# Patient Record
Sex: Female | Born: 1940
Health system: Southern US, Community
[De-identification: ages and names within clinical notes are randomized; demographics above are authoritative.]

## PROBLEM LIST (undated history)

## (undated) DIAGNOSIS — F419 Anxiety disorder, unspecified: Secondary | ICD-10-CM

## (undated) DIAGNOSIS — I10 Essential (primary) hypertension: Secondary | ICD-10-CM

## (undated) DIAGNOSIS — E119 Type 2 diabetes mellitus without complications: Secondary | ICD-10-CM

## (undated) DIAGNOSIS — N951 Menopausal and female climacteric states: Secondary | ICD-10-CM

## (undated) DIAGNOSIS — R079 Chest pain, unspecified: Secondary | ICD-10-CM

## (undated) DIAGNOSIS — E663 Overweight: Secondary | ICD-10-CM

## (undated) HISTORY — DX: Essential (primary) hypertension: I10

## (undated) HISTORY — DX: Anxiety disorder, unspecified: F41.9

## (undated) HISTORY — DX: Menopausal and female climacteric states: N95.1

## (undated) HISTORY — DX: Overweight: E66.3

## (undated) HISTORY — DX: Chest pain, unspecified: R07.9

## (undated) HISTORY — PX: BILATERAL SALPINGOOPHORECTOMY: SHX1223

---
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
## 1999-05-28 ENCOUNTER — Encounter (INDEPENDENT_AMBULATORY_CARE_PROVIDER_SITE_OTHER): Payer: Self-pay | Admitting: Specialist

## 1999-05-28 ENCOUNTER — Other Ambulatory Visit: Admission: RE | Admit: 1999-05-28 | Discharge: 1999-05-28 | Payer: Self-pay | Admitting: Obstetrics and Gynecology

## 1999-06-09 ENCOUNTER — Encounter: Payer: Self-pay | Admitting: Obstetrics and Gynecology

## 1999-06-11 ENCOUNTER — Inpatient Hospital Stay (HOSPITAL_COMMUNITY): Admission: RE | Admit: 1999-06-11 | Discharge: 1999-06-14 | Payer: Self-pay | Admitting: Obstetrics and Gynecology

## 1999-06-11 ENCOUNTER — Encounter (INDEPENDENT_AMBULATORY_CARE_PROVIDER_SITE_OTHER): Payer: Self-pay | Admitting: Specialist

## 2000-04-26 HISTORY — PX: ABDOMINAL HYSTERECTOMY: SHX81

## 2000-06-20 ENCOUNTER — Other Ambulatory Visit: Admission: RE | Admit: 2000-06-20 | Discharge: 2000-06-20 | Payer: Self-pay | Admitting: Obstetrics and Gynecology

## 2001-06-16 ENCOUNTER — Other Ambulatory Visit: Admission: RE | Admit: 2001-06-16 | Discharge: 2001-06-16 | Payer: Self-pay | Admitting: Obstetrics and Gynecology

## 2002-06-27 ENCOUNTER — Other Ambulatory Visit: Admission: RE | Admit: 2002-06-27 | Discharge: 2002-06-27 | Payer: Self-pay | Admitting: Obstetrics and Gynecology

## 2004-07-13 ENCOUNTER — Other Ambulatory Visit: Admission: RE | Admit: 2004-07-13 | Discharge: 2004-07-13 | Payer: Self-pay | Admitting: Obstetrics and Gynecology

## 2004-09-20 ENCOUNTER — Emergency Department: Payer: Self-pay | Admitting: Emergency Medicine

## 2005-08-06 ENCOUNTER — Other Ambulatory Visit: Admission: RE | Admit: 2005-08-06 | Discharge: 2005-08-06 | Payer: Self-pay | Admitting: Obstetrics and Gynecology

## 2007-10-20 ENCOUNTER — Encounter: Payer: Self-pay | Admitting: Cardiology

## 2007-11-13 ENCOUNTER — Ambulatory Visit: Payer: Self-pay | Admitting: Cardiology

## 2008-10-17 DIAGNOSIS — R079 Chest pain, unspecified: Secondary | ICD-10-CM | POA: Insufficient documentation

## 2008-10-17 DIAGNOSIS — E663 Overweight: Secondary | ICD-10-CM | POA: Insufficient documentation

## 2008-10-17 DIAGNOSIS — I10 Essential (primary) hypertension: Secondary | ICD-10-CM | POA: Insufficient documentation

## 2010-05-17 ENCOUNTER — Encounter: Payer: Self-pay | Admitting: Obstetrics and Gynecology

## 2010-09-08 NOTE — Assessment & Plan Note (Signed)
[facility] OFFICE NOTE   NAME:Seiple, VENNIE SALSBURY                 MRN:          [id number]  DATE:11/13/2007                            DOB:          [birth year]    I was asked by Dr. Elease Hashimoto to consult on Emberlie Gotcher, a delightful 70-  year-old divorced [place] female for a family history of coronary artery  disease and a recent episode of chest discomfort.   Ms. Camerer is a 70 year old divorced female who has been under a lot  of stress lately with her family.  She got some bad news about her  brother of hers in [place] recently and had some chest tightness, it  went up into her jaws.  It was relieved with burping and belching.  It  was intermittent but lasted off and on for several days.   She walks 2 miles a day and has no similar symptoms.  She is extremely  health conscious and is very worried about her family history of  coronary artery disease, specifically she had a mother who had a stroke  in her 19s and a brother had a heart attack in his 35s.  I told her that  this was to be expected at that age and I think she concurs.   Her only major risk factors for coronary artery disease at this point is  age 64.  She is a bit overweight, but she is extremely active.  She does  not smoke.  She has a history of hypertension for at least 20 years, but  it has been under great control.  She checks it regularly.  She takes an  aspirin as well.   PAST MEDICAL HISTORY:   ALLERGIES:  She allergic to LATEX.  She is not allergic to dye.   CURRENT MEDICATIONS:  1. Tenoretic 50/25 one daily.  2. Centrum Silver 1 daily.  3. Aspirin 325 mg a day.   She occasionally drinks alcoholic beverages.  She does not smoke.   SURGICAL HISTORY:  1. Hysterectomy in 2002.  2. Appendectomy in 1959.   FAMILY HISTORY:  As above.   SOCIAL HISTORY:  She retired in 2002.  She enjoys babysitting and  housecleaning.  She does  use a little bit of salt at the table.  She is  divorced and has 2 children.   REVIEW OF SYSTEMS:  She has a history of chronic fatigue, bowel  disorder, and urinary tract problems.   Her review of system otherwise are negative.  All systems were  questioned.   PHYSICAL EXAMINATION:  VITAL SIGNS:  Her blood pressure is actually up a  little bit today, but she says she is nervous.  Her blood pressure is  152/87.  Her pulse is 75 and regular.  Weight is 191.  She is 5 foot 1  inch.  GENERAL:  She is extremely pleasant, alert, and oriented x3.  SKIN:  Warm and dry.  She looks much younger than stated age.  HEENT:  PERRLA.  Extraocular movements intact.  Sclerae are clear.  Facial symmetry is normal.  NECK:  Carotids upstrokes are equal bilaterally without bruits.  No JVD.  Thyroid is not enlarged.  LUNGS:  Clear.  HEART:  A poorly appreciated PMI.  She has normal S1 and S2 without  murmur or gallop.  ABDOMEN:  Soft.  Good bowel sounds.  EXTREMITIES:  No cyanosis, clubbing, or edema.  Pulses are intact.  NEURO:  Intact.  SKIN:  Unremarkable.   Electrocardiogram from Dr. Santiago Bur office is essentially normal.   ASSESSMENT AND PLAN:  1. Noncardiac chest pain.  2. Hypertension, under good control at home.  3. Obesity, but regular exercise greater than 3 hours a week.  4. Negative family history of coronary artery disease.   I have had a long discussion with her today.  At this point in time, I  do not think we need to evaluate this further.  If she begins to have  exertional chest tightness or pressure,  I have told her we need to see  her back.  She knows this is angina and how to respond.  She will  continue her aspirin and Tenoretic and follow with Dr. Elease Hashimoto.  Dr.  Elease Hashimoto just ordered lipids, which she will review this week.  I have  recommended a statin, if her LDL is above 100.  I will leave this to Dr.  Santiago Bur discretion.     Thomas C. Daleen Squibb, MD, [facility]   Electronically Signed    TCW/MedQ  DD: 11/13/2007  DT: 11/14/2007  Job #: [id number]   cc:   Lorie Phenix

## 2010-10-15 ENCOUNTER — Encounter: Payer: Self-pay | Admitting: Cardiovascular Disease

## 2011-09-14 DIAGNOSIS — I152 Hypertension secondary to endocrine disorders: Secondary | ICD-10-CM | POA: Insufficient documentation

## 2011-09-14 DIAGNOSIS — I10 Essential (primary) hypertension: Secondary | ICD-10-CM | POA: Insufficient documentation

## 2011-09-14 DIAGNOSIS — N951 Menopausal and female climacteric states: Secondary | ICD-10-CM | POA: Insufficient documentation

## 2011-09-21 ENCOUNTER — Ambulatory Visit (INDEPENDENT_AMBULATORY_CARE_PROVIDER_SITE_OTHER): Payer: Medicare Other | Admitting: Obstetrics and Gynecology

## 2011-09-21 ENCOUNTER — Encounter: Payer: Self-pay | Admitting: Obstetrics and Gynecology

## 2011-09-21 VITALS — BP 124/66 | Ht 61.0 in | Wt 196.0 lb

## 2011-09-21 DIAGNOSIS — R5381 Other malaise: Secondary | ICD-10-CM

## 2011-09-21 DIAGNOSIS — R5383 Other fatigue: Secondary | ICD-10-CM

## 2011-09-21 DIAGNOSIS — Z01419 Encounter for gynecological examination (general) (routine) without abnormal findings: Secondary | ICD-10-CM

## 2011-09-21 LAB — CBC WITH DIFFERENTIAL/PLATELET
Basophils Absolute: 0 10*3/uL (ref 0.0–0.1)
Basophils Relative: 0 % (ref 0–1)
Eosinophils Absolute: 0.1 10*3/uL (ref 0.0–0.7)
Eosinophils Relative: 2 % (ref 0–5)
HCT: 42.3 % (ref 36.0–46.0)
Hemoglobin: 14.6 g/dL (ref 12.0–15.0)
Lymphocytes Relative: 37 % (ref 12–46)
Lymphs Abs: 1.7 10*3/uL (ref 0.7–4.0)
MCH: 32.1 pg (ref 26.0–34.0)
MCHC: 34.5 g/dL (ref 30.0–36.0)
MCV: 93 fL (ref 78.0–100.0)
Monocytes Absolute: 0.4 10*3/uL (ref 0.1–1.0)
Monocytes Relative: 8 % (ref 3–12)
Neutro Abs: 2.4 10*3/uL (ref 1.7–7.7)
Neutrophils Relative %: 53 % (ref 43–77)
Platelets: 257 10*3/uL (ref 150–400)
RBC: 4.55 MIL/uL (ref 3.87–5.11)
RDW: 13.8 % (ref 11.5–15.5)
WBC: 4.6 10*3/uL (ref 4.0–10.5)

## 2011-09-21 LAB — TSH: TSH: 1.856 u[IU]/mL (ref 0.350–4.500)

## 2011-09-21 MED ORDER — VALACYCLOVIR HCL 500 MG PO TABS: 500.0000 mg | ORAL_TABLET | ORAL | Status: DC | PRN

## 2011-09-21 MED ORDER — FLUCONAZOLE 150 MG PO TABS: 150.0000 mg | ORAL_TABLET | Freq: Once | ORAL | Status: AC

## 2011-09-21 NOTE — Patient Instructions (Signed)
Patient Education Materials to be provided at check out (*indicates is located in accordion folder):  *Calcium Recommendations  

## 2011-09-21 NOTE — Progress Notes (Signed)
The patient is not taking hormone replacement therapy The patient  is taking a Calcium supplement. Post-menopausal bleeding:no  Last Pap: was normal April  2010 Last mammogram:  Pt stated declines Mammogram Last DEXA scan : T=1.28 Aug 2010 Last colonoscopy:was normal August 2010  Urinary symptoms: none Normal bowel movements: Yes Reports abuse at home: No   Subjective:    Nicole Lynn is a 71 y.o. female [phone] who presents for annual exam. S/P TAH /BSO The patien C/O fatigue   The following portions of the patient's history were reviewed and updated as appropriate: allergies, current medications, past family history, past medical history, past social history, past surgical history and problem list.  Review of Systems Pertinent items are noted in HPI. Gastrointestinal:No change in bowel habits, no abdominal pain, no rectal bleeding Genitourinary:negative for dysuria, frequency, hematuria, nocturia and urinary incontinence    Objective:     BP 124/66  Ht 5\' 1"  (1.549 m)  Wt 196 lb (88.905 kg)  BMI 37.03 kg/m2  Weight:  Wt Readings from Last 1 Encounters:  09/21/11 196 lb (88.905 kg)     BMI: Body mass index is 37.03 kg/(m^2). General Appearance: Alert, appropriate appearance for age. No acute distress HEENT: Grossly normal Neck / Thyroid: Supple, no masses, nodes or enlargement Lungs: clear to auscultation bilaterally Back: No CVA tenderness Breast Exam: No masses or nodes.No dimpling, nipple retraction or discharge. Cardiovascular: Regular rate and rhythm. S1, S2, no murmur Gastrointestinal: Soft, non-tender, no masses or organomegaly Pelvic Exam: Vulva and vagina normal. Uterus and adnexa surgically absent Rectovaginal: normal rectal, no masses Lymphatic Exam: Non-palpable nodes in neck, clavicular, axillary, or inguinal regions Skin: no rash or abnormalities Neurologic: Normal gait and speech, no tremor  Psychiatric: Alert and oriented, appropriate affect.           Assessment:    Normal gyn exam Fatigue    Plan:    CBC,Vitamin D, TSH   Follow-up:  for annual exam

## 2011-09-21 NOTE — Progress Notes (Signed)
Addended by: Rolla Plate on: 09/21/2011 12:20 PM   Modules accepted: Orders

## 2011-09-22 LAB — VITAMIN D 25 HYDROXY (VIT D DEFICIENCY, FRACTURES): Vit D, 25-Hydroxy: 36 ng/mL (ref 30–89)

## 2013-06-21 ENCOUNTER — Emergency Department: Payer: Self-pay | Admitting: Emergency Medicine

## 2013-06-21 LAB — CBC
HCT: 41.6 % (ref 35.0–47.0)
HGB: 14.1 g/dL (ref 12.0–16.0)
MCH: 32.9 pg (ref 26.0–34.0)
MCHC: 33.9 g/dL (ref 32.0–36.0)
MCV: 97 fL (ref 80–100)
Platelet: 201 10*3/uL (ref 150–440)
RBC: 4.28 10*6/uL (ref 3.80–5.20)
RDW: 13.2 % (ref 11.5–14.5)
WBC: 4.9 10*3/uL (ref 3.6–11.0)

## 2013-06-21 LAB — BASIC METABOLIC PANEL
Anion Gap: 7 (ref 7–16)
BUN: 17 mg/dL (ref 7–18)
Calcium, Total: 8.5 mg/dL (ref 8.5–10.1)
Chloride: 104 mmol/L (ref 98–107)
Co2: 27 mmol/L (ref 21–32)
Creatinine: 0.54 mg/dL — ABNORMAL LOW (ref 0.60–1.30)
EGFR (African American): >60
EGFR (Non-African Amer.): >60
Glucose: 180 mg/dL — ABNORMAL HIGH (ref 65–99)
Osmolality: 282 (ref 275–301)
Potassium: 4.2 mmol/L (ref 3.5–5.1)
Sodium: 138 mmol/L (ref 136–145)

## 2013-06-21 LAB — TROPONIN I: Troponin-I: <0.02 ng/mL

## 2013-11-21 ENCOUNTER — Other Ambulatory Visit: Payer: Self-pay | Admitting: *Deleted

## 2013-11-21 DIAGNOSIS — I83893 Varicose veins of bilateral lower extremities with other complications: Secondary | ICD-10-CM

## 2014-01-25 ENCOUNTER — Encounter: Payer: Self-pay | Admitting: Surgery

## 2014-01-28 ENCOUNTER — Encounter (INDEPENDENT_AMBULATORY_CARE_PROVIDER_SITE_OTHER): Payer: Medicare Other | Admitting: Surgery

## 2014-01-28 ENCOUNTER — Ambulatory Visit (HOSPITAL_COMMUNITY)
Admission: RE | Admit: 2014-01-28 | Discharge: 2014-01-28 | Disposition: A | Discharge status: Home / Self Care | Payer: Medicare Other | Source: Ambulatory Visit | Attending: Surgery | Admitting: Surgery

## 2014-01-28 DIAGNOSIS — I8393 Asymptomatic varicose veins of bilateral lower extremities: Secondary | ICD-10-CM | POA: Insufficient documentation

## 2014-02-01 ENCOUNTER — Encounter: Payer: Self-pay | Admitting: Surgery

## 2014-02-04 ENCOUNTER — Encounter: Payer: Self-pay | Admitting: Surgery

## 2014-02-04 ENCOUNTER — Ambulatory Visit (INDEPENDENT_AMBULATORY_CARE_PROVIDER_SITE_OTHER): Payer: Medicare Other | Admitting: Surgery

## 2014-02-04 VITALS — BP 140/78 | HR 80 | Ht 61.0 in | Wt 182.0 lb

## 2014-02-04 DIAGNOSIS — I8390 Asymptomatic varicose veins of unspecified lower extremity: Secondary | ICD-10-CM | POA: Insufficient documentation

## 2014-02-04 DIAGNOSIS — I83893 Varicose veins of bilateral lower extremities with other complications: Secondary | ICD-10-CM

## 2014-02-04 DIAGNOSIS — I83899 Varicose veins of unspecified lower extremities with other complications: Secondary | ICD-10-CM | POA: Insufficient documentation

## 2014-02-04 NOTE — Progress Notes (Signed)
Patient name: Nicole Lynn MRN: [id number] DOB: 1940-06-19 Sex: female   Referred by: Dr. Estanislado Pandyivard  Reason for referral:  Chief Complaint  Patient presents with  . New Evaluation    vv's and spiders     HISTORY OF PRESENT ILLNESS: The patient comes in today for evaluation of bilateral varicose veins.  These have gotten to the point where they now visually bother her.  She denies any episodes of bleeding.  She denies any significant lower extremity swelling with the exception of when she goes on long trips.  She has not had any bleeding episodes.  The patient sought medical issues is hypertension which she takes medication for.  She is a nonsmoker.  Past Medical History  Diagnosis Date  . Overweight(278.02)   . Chest pain, unspecified   . Menopausal state   . HTN (hypertension)     Past Surgical History  Procedure Laterality Date  . Appendectomy  1959  . Abdominal hysterectomy  2002    TAH/BSO  . Bilateral salpingoophorectomy      History   Social History  . Marital Status: Married    Spouse Name: N/A    Number of Children: N/A  . Years of Education: N/A   Occupational History  . Not on file.   Social History Main Topics  . Smoking status: Never Smoker   . Smokeless tobacco: Never Used  . Alcohol Use: No  . Drug Use: No  . Sexual Activity: No     Comment: BTL/HYST   Other Topics Concern  . Not on file   Social History Narrative   Full time. Regularly exercises.     Family History  Problem Relation Age of Onset  . Cancer Other   . Coronary artery disease Other   . Stroke Other   . Stroke Mother   . Prostate cancer Father     Allergies as of 02/04/2014 - Review Complete 02/04/2014  Allergen Reaction Noted  . Latex  09/14/2011    Current Outpatient Prescriptions on File Prior to Visit  Medication Sig Dispense Refill  . atenolol-chlorthalidone (TENORETIC) 50-25 MG per tablet Take 1 tablet by mouth daily.      . fish oil-omega-3 fatty  acids 1000 MG capsule Take 2 g by mouth daily.      . Multiple Vitamins-Minerals (CENTRUM SILVER PO) Take by mouth once.      . valACYclovir (VALTREX) 500 MG tablet Take 1 tablet (500 mg total) by mouth as needed.  30 tablet  11   No current facility-administered medications on file prior to visit.     REVIEW OF SYSTEMS: Cardiovascular: No chest pain, chest pressure, palpitations, orthopnea, or dyspnea on exertion. No claudication or rest pain,  positive for Pulmonary: No productive cough, asthma or wheezing. Neurologic: No weakness, paresthesias, aphasia, or amaurosis. No dizziness. Hematologic: No bleeding problems or clotting disorders. Musculoskeletal: No joint pain or joint swelling. Gastrointestinal: No blood in stool or hematemesis Genitourinary: No dysuria or hematuria. Psychiatric:: No history of major depression. Integumentary: No rashes or ulcers. Constitutional: No fever or chills.  PHYSICAL EXAMINATION: General: The patient appears their stated age.  Vital signs are BP 140/78  Pulse 80  Ht 5\' 1"  (1.549 m)  Wt 182 lb (82.555 kg)  BMI 34.41 kg/m2  SpO2 97% HEENT:  No gross abnormalities Pulmonary: Respirations are non-labored Musculoskeletal: There are no major deformities.   Neurologic: No focal weakness or paresthesias are detected, Skin: There are no ulcer or  rashes noted. Psychiatric: The patient has normal affect. Cardiovascular: Bilateral spider veins in the anterior thigh and posterior knee  Diagnostic Studies: I have ordered and reviewed her venous insufficiency study.  She has no evidence of deep or superficial venous reflux in the left leg.  There is no deep vein reflux on the right.  There is reflux within the right short saphenous vein.  The great saphenous vein has no reflux   Assessment:  Venous insufficiency right leg Bilateral spider veins Plan: I do not think that the patient's reflux on the right leg is contributing to any symptoms.  Her biggest  complaint is that of spider veins.  I think she would be a good candidate for sclerotherapy on both legs.  I think this could probably be done with one vial.  She will be evaluated by Clementeen HoofLiz Wert later today.  I discussed getting thigh-high compression stockings for the procedure.     Jorge NyV. Wells Chayil Gantt IV, M.D. [facility] Office: [phone] Pager:  [phone]

## 2014-02-25 ENCOUNTER — Encounter: Payer: Self-pay | Admitting: Surgery

## 2014-03-27 ENCOUNTER — Ambulatory Visit: Payer: Medicare Other | Admitting: *Deleted

## 2014-05-14 ENCOUNTER — Encounter: Payer: Self-pay | Admitting: *Deleted

## 2014-05-15 ENCOUNTER — Ambulatory Visit (INDEPENDENT_AMBULATORY_CARE_PROVIDER_SITE_OTHER): Payer: Medicare Other | Admitting: *Deleted

## 2014-05-15 DIAGNOSIS — I8393 Asymptomatic varicose veins of bilateral lower extremities: Secondary | ICD-10-CM

## 2014-05-15 DIAGNOSIS — I781 Nevus, non-neoplastic: Secondary | ICD-10-CM

## 2014-05-15 NOTE — Progress Notes (Signed)
Pt [place] in [place] so we rescheduled for 06/26/14.

## 2014-05-29 ENCOUNTER — Ambulatory Visit: Payer: Self-pay

## 2014-06-25 ENCOUNTER — Encounter: Payer: Self-pay | Admitting: *Deleted

## 2014-06-26 ENCOUNTER — Ambulatory Visit (INDEPENDENT_AMBULATORY_CARE_PROVIDER_SITE_OTHER): Payer: Medicare Other | Admitting: *Deleted

## 2014-06-26 DIAGNOSIS — I8393 Asymptomatic varicose veins of bilateral lower extremities: Secondary | ICD-10-CM

## 2014-06-26 DIAGNOSIS — I781 Nevus, non-neoplastic: Secondary | ICD-10-CM

## 2014-06-26 NOTE — Progress Notes (Signed)
X=.3% Sotradecol administered with a 27g butterfly.  Patient received a total of 12cc.  Treated all areas of concern with 2 syringes. Majority were on the back of her knees. Tol well. Easy access. Will follow prn.  Photos: Yes.    Compression stockings applied: Yes.

## 2014-06-27 ENCOUNTER — Encounter: Payer: Self-pay | Admitting: Vascular Surgery

## 2015-02-11 IMAGING — CR DG CHEST 1V PORT
1 series · 1 of 1 positions shown · non-contrast
Comparison: None.

CLINICAL DATA: Chest pain

EXAM:
PORTABLE CHEST - 1 VIEW

[ap]
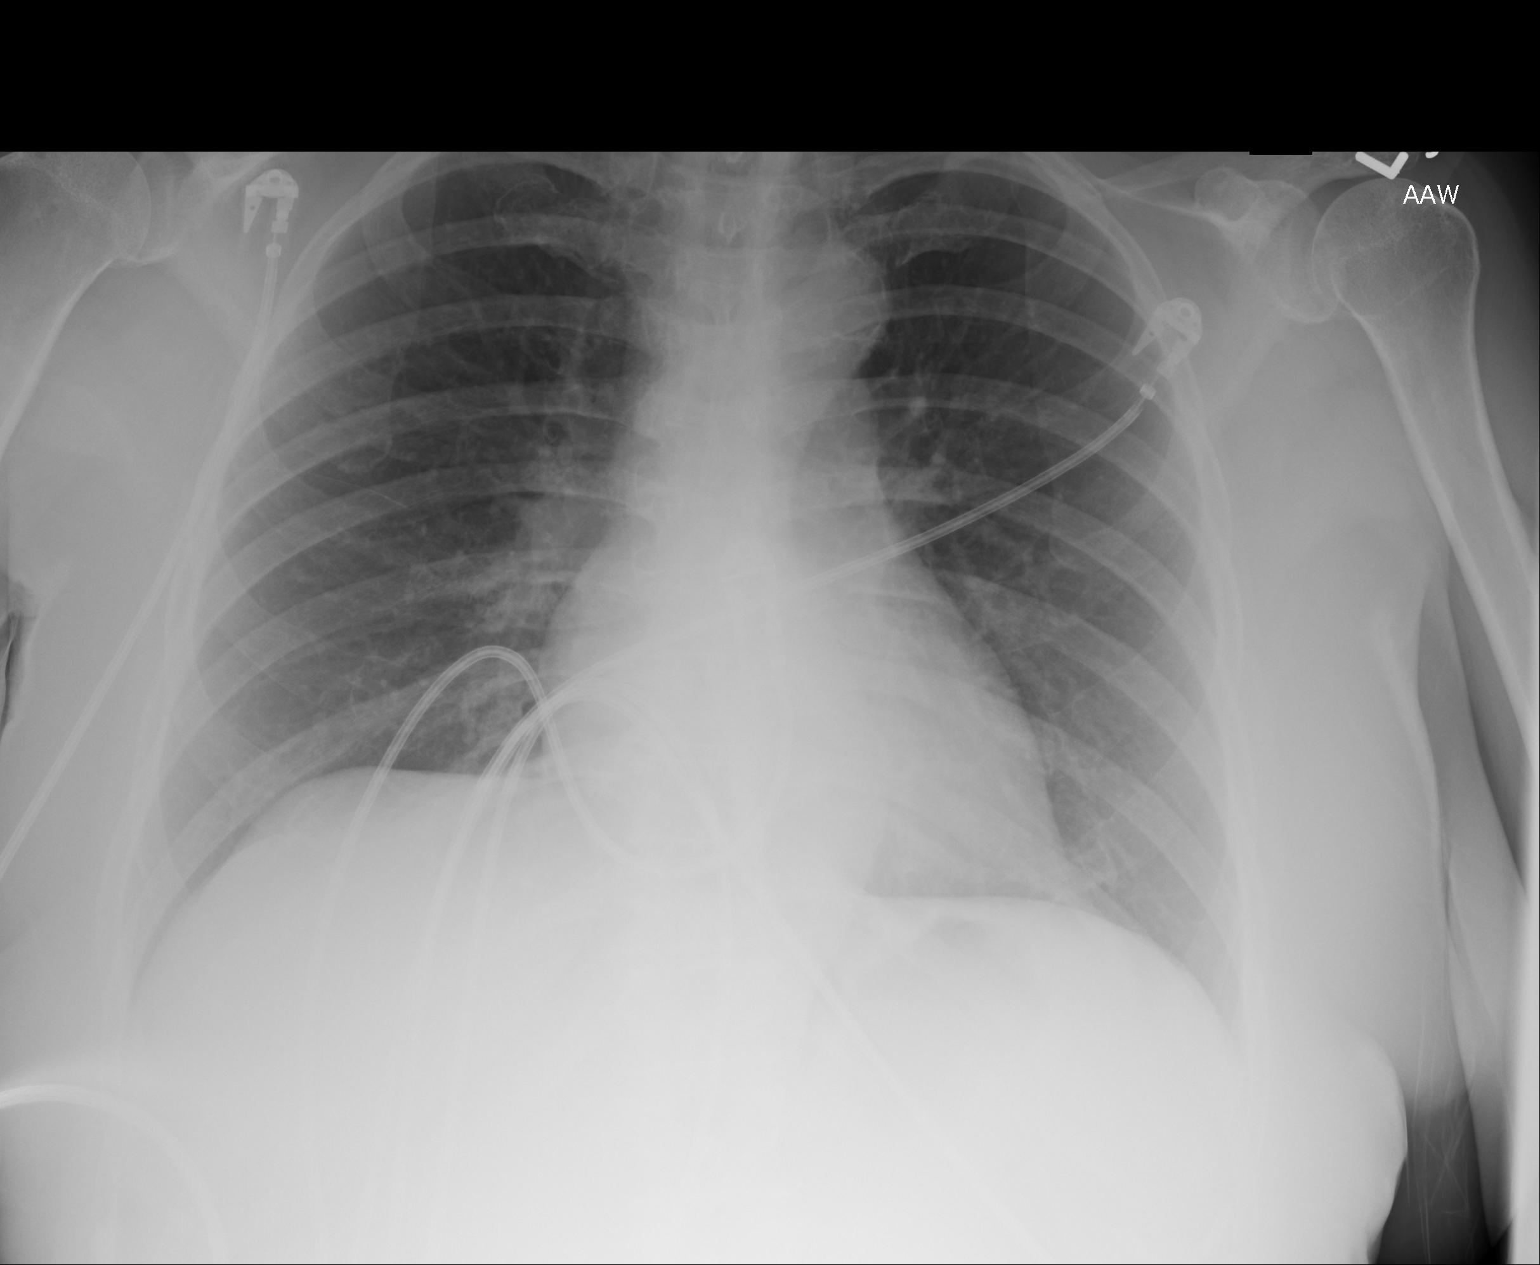

[1 of 1 positions shown; findings below may reference images not displayed]

FINDINGS: Cardiomediastinal silhouette is unremarkable. No acute infiltrate or
pleural effusion. No pulmonary edema. Bony thorax is unremarkable.
IMPRESSION: No active disease.

## 2015-02-28 ENCOUNTER — Other Ambulatory Visit: Payer: Self-pay | Admitting: Family Medicine

## 2015-02-28 DIAGNOSIS — G4700 Insomnia, unspecified: Secondary | ICD-10-CM

## 2015-02-28 MED ORDER — ZOLPIDEM TARTRATE 10 MG PO TABS: 10.0000 mg | ORAL_TABLET | Freq: Every day | ORAL | Status: DC

## 2015-02-28 NOTE — Telephone Encounter (Signed)
Printed, please fax or call in to pharmacy. Thank you.   

## 2015-02-28 NOTE — Telephone Encounter (Signed)
Pt contacted office for refill request on the following medications:  Zolpidem 10mg .  [facility] [address]  [phone]#[phone]/MW

## 2015-05-16 ENCOUNTER — Encounter: Payer: Self-pay | Admitting: Family Medicine

## 2015-05-16 ENCOUNTER — Ambulatory Visit (INDEPENDENT_AMBULATORY_CARE_PROVIDER_SITE_OTHER): Payer: Medicare Other | Admitting: Family Medicine

## 2015-05-16 VITALS — BP 124/64 | HR 72 | Temp 97.8°F | Resp 16 | Ht 60.5 in | Wt 192.0 lb

## 2015-05-16 DIAGNOSIS — E7800 Pure hypercholesterolemia, unspecified: Secondary | ICD-10-CM | POA: Insufficient documentation

## 2015-05-16 DIAGNOSIS — F40240 Claustrophobia: Secondary | ICD-10-CM | POA: Insufficient documentation

## 2015-05-16 DIAGNOSIS — E669 Obesity, unspecified: Secondary | ICD-10-CM | POA: Insufficient documentation

## 2015-05-16 DIAGNOSIS — Z0000 Encounter for general adult medical examination without abnormal findings: Secondary | ICD-10-CM | POA: Diagnosis not present

## 2015-05-16 DIAGNOSIS — R739 Hyperglycemia, unspecified: Secondary | ICD-10-CM | POA: Insufficient documentation

## 2015-05-16 DIAGNOSIS — I10 Essential (primary) hypertension: Secondary | ICD-10-CM | POA: Insufficient documentation

## 2015-05-16 DIAGNOSIS — E038 Other specified hypothyroidism: Secondary | ICD-10-CM | POA: Insufficient documentation

## 2015-05-16 DIAGNOSIS — Z23 Encounter for immunization: Secondary | ICD-10-CM

## 2015-05-16 DIAGNOSIS — M25569 Pain in unspecified knee: Secondary | ICD-10-CM | POA: Insufficient documentation

## 2015-05-16 DIAGNOSIS — R5383 Other fatigue: Secondary | ICD-10-CM | POA: Insufficient documentation

## 2015-05-16 DIAGNOSIS — Z87440 Personal history of urinary (tract) infections: Secondary | ICD-10-CM

## 2015-05-16 DIAGNOSIS — E039 Hypothyroidism, unspecified: Secondary | ICD-10-CM

## 2015-05-16 DIAGNOSIS — E1169 Type 2 diabetes mellitus with other specified complication: Secondary | ICD-10-CM | POA: Insufficient documentation

## 2015-05-16 DIAGNOSIS — F419 Anxiety disorder, unspecified: Secondary | ICD-10-CM | POA: Insufficient documentation

## 2015-05-16 MED ORDER — PHENAZOPYRIDINE HCL 200 MG PO TABS: 200.0000 mg | ORAL_TABLET | Freq: Three times a day (TID) | ORAL | Status: DC | PRN

## 2015-05-16 MED ORDER — NITROFURANTOIN MACROCRYSTAL 100 MG PO CAPS: 100.0000 mg | ORAL_CAPSULE | Freq: Four times a day (QID) | ORAL | Status: DC

## 2015-05-16 MED ORDER — CIPROFLOXACIN HCL 500 MG PO TABS: 500.0000 mg | ORAL_TABLET | Freq: Two times a day (BID) | ORAL | Status: DC

## 2015-05-16 MED ORDER — ATENOLOL-CHLORTHALIDONE 50-25 MG PO TABS: 1.0000 | ORAL_TABLET | Freq: Every day | ORAL | Status: DC

## 2015-05-16 NOTE — Progress Notes (Signed)
Patient ID: Nicole Lynn, female   DOB: 1941-01-22, 75 y.o.   MRN: [id number]       Patient: Nicole Lynn, Female    DOB: 1941/02/06, 75 y.o.   MRN: [id number] Visit Date: 05/16/2015  Today's Provider: Lorie Phenix, MD   Chief Complaint  Patient presents with  . Medicare Wellness   Subjective:    Annual wellness visit Nicole Lynn is a 75 y.o. female. She feels well. She reports exercising daily (yardwork). She reports she is sleeping well. 02/18/14 CPE  -----------------------------------------------------------   Review of Systems  Constitutional: Negative.   HENT: Negative.   Eyes: Negative.   Respiratory: Negative.   Cardiovascular: Negative.   Gastrointestinal: Negative.   Endocrine: Negative.   Genitourinary: Negative.   Musculoskeletal: Negative.   Skin: Negative.   Allergic/Immunologic: Negative.   Neurological: Negative.   Hematological: Negative.   Psychiatric/Behavioral: Negative.     Social History   Social History  . Marital Status: Married    Spouse Name: N/A  . Number of Children: N/A  . Years of Education: N/A   Occupational History  . Not on file.   Social History Main Topics  . Smoking status: Never Smoker   . Smokeless tobacco: Never Used  . Alcohol Use: Not on file  . Drug Use: No  . Sexual Activity: No     Comment: BTL/HYST   Other Topics Concern  . Not on file   Social History Narrative   Full time. Regularly exercises.     Past Medical History  Diagnosis Date  . Overweight(278.02)   . Chest pain, unspecified   . Menopausal state   . HTN (hypertension)   . Anxiety      Patient Active Problem List   Diagnosis Date Noted  . Anxiety 05/16/2015  . Claustrophobia 05/16/2015  . Fatigue 05/16/2015  . Hypercholesteremia 05/16/2015  . Blood glucose elevated 05/16/2015  . BP (high blood pressure) 05/16/2015  . Adiposity 05/16/2015  . Gonalgia 05/16/2015  . Subclinical hypothyroidism 05/16/2015  .  Insomnia 02/28/2015  . Varicose veins of leg with complications 02/04/2014  . Leg varices 02/04/2014  . Menopausal state   . HTN (hypertension)   . OVERWEIGHT/OBESITY 10/17/2008  . CHEST PAIN-UNSPECIFIED 10/17/2008  . Chest pain 10/17/2008    Past Surgical History  Procedure Laterality Date  . Appendectomy  1959  . Abdominal hysterectomy  2002    TAH/BSO  . Bilateral salpingoophorectomy      Her family history includes Cancer in her other; Coronary artery disease in her other; Prostate cancer in her father; Stroke in her mother and other.    Previous Medications   ATENOLOL-CHLORTHALIDONE (TENORETIC) 50-25 MG PER TABLET    Take 1 tablet by mouth daily.   FISH OIL-OMEGA-3 FATTY ACIDS 1000 MG CAPSULE    Take 2 g by mouth daily.   MULTIPLE VITAMINS-MINERALS (CENTRUM SILVER PO)    Take by mouth once.   ZOLPIDEM (AMBIEN) 10 MG TABLET    Take 1 tablet (10 mg total) by mouth at bedtime.    Patient Care Team: Lorie Phenix, MD as PCP - General (Family Medicine)     Objective:   Vitals: BP 124/64 mmHg  Pulse 72  Temp(Src) 97.8 F (36.6 C) (Oral)  Resp 16  Ht 5' 0.5" (1.537 m)  Wt 192 lb (87.091 kg)  BMI 36.87 kg/m2  SpO2 97%  Physical Exam  Constitutional: She is oriented to person, place, and time. She appears well-developed and well-nourished.  HENT:  Head: Normocephalic and atraumatic.  Right Ear: Tympanic membrane, external ear and ear canal normal.  Left Ear: Tympanic membrane, external ear and ear canal normal.  Nose: Nose normal.  Mouth/Throat: Uvula is midline, oropharynx is clear and moist and mucous membranes are normal.  Eyes: Conjunctivae, EOM and lids are normal. Pupils are equal, round, and reactive to light.  Neck: Trachea normal and normal range of motion. Neck supple. Carotid bruit is not present. No thyroid mass and no thyromegaly present.  Cardiovascular: Normal rate, regular rhythm and normal heart sounds.   Pulmonary/Chest: Effort normal and breath  sounds normal.  Abdominal: Soft. Normal appearance and bowel sounds are normal. There is no hepatosplenomegaly. There is no tenderness.  Genitourinary: No breast swelling, tenderness or discharge.  Musculoskeletal: Normal range of motion.  Lymphadenopathy:    She has no cervical adenopathy.    She has no axillary adenopathy.  Neurological: She is alert and oriented to person, place, and time. She has normal strength. No cranial nerve deficit.  Skin: Skin is warm, dry and intact.  Psychiatric: She has a normal mood and affect. Her speech is normal and behavior is normal. Judgment and thought content normal. Cognition and memory are normal.    Activities of Daily Living In your present state of health, do you have any difficulty performing the following activities: 05/16/2015  Hearing? N  Vision? N  Difficulty concentrating or making decisions? N  Walking or climbing stairs? N  Dressing or bathing? N  Doing errands, shopping? N    Fall Risk Assessment Fall Risk  05/16/2015  Falls in the past year? No     Depression Screen PHQ 2/9 Scores 05/16/2015  PHQ - 2 Score 0    Cognitive Testing - 6-CIT  Correct? Score   What year is it? yes 0 0 or 4  What month is it? yes 0 0 or 3  Memorize:    Floyde Parkins,  44,  [address]  [place],      What time is it? (within 1 hour) yes 0 0 or 3  Count backwards from 20 yes 0 0, 2, or 4  Name the months of the year yes 0 0, 2, or 4  Repeat name & address above no 1 0, 2, 4, 6, 8, or 10       TOTAL SCORE  1/28   Interpretation:  Normal  Normal (0-7) Abnormal (8-28)       Assessment & Plan:     Annual Wellness Visit  Reviewed patient's Family Medical History Reviewed and updated list of patient's medical providers Assessment of cognitive impairment was done Assessed patient's functional ability Established a written schedule for health screening services Health Risk Assessent Completed and Reviewed  Exercise Activities and Dietary  recommendations Goals    None      Immunization History  Administered Date(s) Administered  . Influenza,inj,Quad PF,36+ Mos 05/16/2015  . Pneumococcal Conjugate-13 05/16/2015  . Pneumococcal Polysaccharide-23 01/08/2013  . Zoster 10/25/2012      1. Medicare annual wellness visit, subsequent Stable. Patient advised to continue eating healthy and exercise daily.  2. Need for influenza vaccination - Flu Vaccine QUAD 36+ mos IM  3. Need for pneumococcal vaccination - Pneumococcal conjugate vaccine 13-valent IM  4. Subclinical hypothyroidism - TSH  5. Essential hypertension - CBC with Differential/Platelet - Comprehensive metabolic panel  6. Hypercholesteremia - Lipid Panel With LDL/HDL Ratio  7. Blood glucose elevated - Hemoglobin A1c   8. History  of recurrent UTIs Refill medication to sue as needed.  - ciprofloxacin (CIPRO) 500 MG tablet; Take 1 tablet (500 mg total) by mouth 2 (two) times daily.  Dispense: 14 tablet; Refill: 1 - phenazopyridine (PYRIDIUM) 200 MG tablet; Take 1 tablet (200 mg total) by mouth 3 (three) times daily as needed for pain.  Dispense: 10 tablet; Refill: 1 - nitrofurantoin (MACRODANTIN) 100 MG capsule; Take 1 capsule (100 mg total) by mouth 4 (four) times daily.  Dispense: 30 capsule; Refill: 5    Patient seen and examinedLeo GrosserJ.. Oluwateniola Leitch, and note scribed by Liz Beach. Dimas, CMA.  I have reviewed the document for accuracy and completeness and I agree with above. Leo Grosser, MD   Lorie Phenix, MD      ------------------------------------------------------------------------------------------------------------

## 2015-05-19 ENCOUNTER — Other Ambulatory Visit: Payer: Self-pay | Admitting: Family Medicine

## 2015-05-19 DIAGNOSIS — I10 Essential (primary) hypertension: Secondary | ICD-10-CM

## 2015-05-28 LAB — CBC WITH DIFFERENTIAL/PLATELET
Basophils Absolute: 0.1 10*3/uL (ref 0.0–0.2)
Basos: 1 %
EOS (ABSOLUTE): 0.2 10*3/uL (ref 0.0–0.4)
Eos: 3 %
Hematocrit: 45.1 % (ref 34.0–46.6)
Hemoglobin: 15 g/dL (ref 11.1–15.9)
Immature Grans (Abs): 0 10*3/uL (ref 0.0–0.1)
Immature Granulocytes: 0 %
Lymphocytes Absolute: 1.9 10*3/uL (ref 0.7–3.1)
Lymphs: 37 %
MCH: 32.4 pg (ref 26.6–33.0)
MCHC: 33.3 g/dL (ref 31.5–35.7)
MCV: 97 fL (ref 79–97)
Monocytes Absolute: 0.4 10*3/uL (ref 0.1–0.9)
Monocytes: 8 %
Neutrophils Absolute: 2.6 10*3/uL (ref 1.4–7.0)
Neutrophils: 51 %
Platelets: 230 10*3/uL (ref 150–379)
RBC: 4.63 x10E6/uL (ref 3.77–5.28)
RDW: 13.7 % (ref 12.3–15.4)
WBC: 5.1 10*3/uL (ref 3.4–10.8)

## 2015-05-28 LAB — COMPREHENSIVE METABOLIC PANEL
ALT: 21 IU/L (ref 0–32)
AST: 19 IU/L (ref 0–40)
Albumin/Globulin Ratio: 1.8 (ref 1.1–2.5)
Albumin: 4.1 g/dL (ref 3.5–4.8)
Alkaline Phosphatase: 55 IU/L (ref 39–117)
BUN/Creatinine Ratio: 29 — ABNORMAL HIGH (ref 11–26)
BUN: 19 mg/dL (ref 8–27)
Bilirubin Total: 0.4 mg/dL (ref 0.0–1.2)
CO2: 24 mmol/L (ref 18–29)
Calcium: 9.4 mg/dL (ref 8.7–10.3)
Chloride: 100 mmol/L (ref 96–106)
Creatinine, Ser: 0.65 mg/dL (ref 0.57–1.00)
GFR calc Af Amer: 103 mL/min/{1.73_m2} (ref >59)
GFR calc non Af Amer: 89 mL/min/{1.73_m2} (ref >59)
Globulin, Total: 2.3 g/dL (ref 1.5–4.5)
Glucose: 136 mg/dL — ABNORMAL HIGH (ref 65–99)
Potassium: 4.3 mmol/L (ref 3.5–5.2)
Sodium: 142 mmol/L (ref 134–144)
Total Protein: 6.4 g/dL (ref 6.0–8.5)

## 2015-05-28 LAB — HEMOGLOBIN A1C
Est. average glucose Bld gHb Est-mCnc: 151 mg/dL
Hgb A1c MFr Bld: 6.9 % — ABNORMAL HIGH (ref 4.8–5.6)

## 2015-05-28 LAB — LIPID PANEL WITH LDL/HDL RATIO
Cholesterol, Total: 248 mg/dL — ABNORMAL HIGH (ref 100–199)
HDL: 54 mg/dL (ref >39)
LDL Calculated: 149 mg/dL — ABNORMAL HIGH (ref 0–99)
LDl/HDL Ratio: 2.8 ratio units (ref 0.0–3.2)
Triglycerides: 225 mg/dL — ABNORMAL HIGH (ref 0–149)
VLDL Cholesterol Cal: 45 mg/dL — ABNORMAL HIGH (ref 5–40)

## 2015-05-28 LAB — TSH: TSH: 5.35 u[IU]/mL — ABNORMAL HIGH (ref 0.450–4.500)

## 2015-05-29 ENCOUNTER — Telehealth: Payer: Self-pay

## 2015-05-29 NOTE — Telephone Encounter (Signed)
Pt advised; made apt for 06/06/2015 at 2:30  Thanks,   -Vernona Rieger

## 2015-05-29 NOTE — Telephone Encounter (Signed)
-----   Message from Lorie Phenix, MD sent at 05/29/2015  1:22 PM EST ----- Blood sugar now  In diabetic range and  Cholesterol elevated.   Recommend ov to discuss treatment options.  Thanks.

## 2015-05-29 NOTE — Telephone Encounter (Signed)
LMTCB 05/29/2015  Thanks,   -Vernona Rieger

## 2015-06-06 ENCOUNTER — Ambulatory Visit (INDEPENDENT_AMBULATORY_CARE_PROVIDER_SITE_OTHER): Payer: Medicare Other | Admitting: Family Medicine

## 2015-06-06 ENCOUNTER — Encounter: Payer: Self-pay | Admitting: Family Medicine

## 2015-06-06 VITALS — BP 122/70 | HR 68 | Temp 98.2°F | Resp 16 | Wt 192.0 lb

## 2015-06-06 DIAGNOSIS — I10 Essential (primary) hypertension: Secondary | ICD-10-CM

## 2015-06-06 DIAGNOSIS — E119 Type 2 diabetes mellitus without complications: Secondary | ICD-10-CM | POA: Insufficient documentation

## 2015-06-06 NOTE — Progress Notes (Signed)
Patient ID: Nicole Lynn, female   DOB: 1940/07/14, 75 y.o.   MRN: [id number]         Patient: Nicole Lynn Female    DOB: 05/31/1940   75 y.o.   MRN: [id number] Visit Date: 06/06/2015  Today's Provider: Lorie Phenix, MD   Chief Complaint  Patient presents with  . Diabetes  . Hyperlipidemia   Subjective:    Diabetes She presents for her initial diabetic visit. She has type 2 diabetes mellitus. No MedicAlert identification noted. There are no hypoglycemic associated symptoms. Pertinent negatives for hypoglycemia include no dizziness or headaches. Pertinent negatives for diabetes include no blurred vision, no chest pain, no fatigue, no foot paresthesias, no foot ulcerations, no polydipsia, no polyphagia, no polyuria, no visual change, no weakness and no weight loss. Risk factors for coronary artery disease include diabetes mellitus, dyslipidemia and obesity. When asked about current treatments, none were reported. She does not see a podiatrist.Eye exam is current.  Hyperlipidemia The problem is uncontrolled. Recent lipid tests were reviewed and are high. Pertinent negatives include no chest pain. Risk factors for coronary artery disease include diabetes mellitus, dyslipidemia, hypertension and obesity.   Lab Results  Component Value Date   CHOL 248* 05/27/2015   HDL 54 05/27/2015   LDLCALC 149* 05/27/2015   TRIG 225* 05/27/2015   Lab Results  Component Value Date   HGBA1C 6.9* 05/27/2015        Allergies  Allergen Reactions  . Latex    Previous Medications   ATENOLOL-CHLORTHALIDONE (TENORETIC) 50-25 MG TABLET    take 1 tablet by mouth once daily   FISH OIL-OMEGA-3 FATTY ACIDS 1000 MG CAPSULE    Take 2 g by mouth daily.   MULTIPLE VITAMINS-MINERALS (CENTRUM SILVER PO)    Take by mouth once.   NITROFURANTOIN, MACROCRYSTAL-MONOHYDRATE, (MACROBID) 100 MG CAPSULE    take 1 capsule by mouth once daily if needed   PHENAZOPYRIDINE (PYRIDIUM) 200 MG TABLET    Take 1  tablet (200 mg total) by mouth 3 (three) times daily as needed for pain.   ZOLPIDEM (AMBIEN) 10 MG TABLET    Take 1 tablet (10 mg total) by mouth at bedtime.    Review of Systems  Constitutional: Negative.  Negative for weight loss and fatigue.  Eyes: Negative for blurred vision.  Respiratory: Negative.   Cardiovascular: Negative.  Negative for chest pain.  Gastrointestinal: Negative.   Endocrine: Negative.  Negative for polydipsia, polyphagia and polyuria.  Musculoskeletal: Negative.   Neurological: Negative for dizziness, weakness, light-headedness and headaches.    Social History  Substance Use Topics  . Smoking status: Never Smoker   . Smokeless tobacco: Never Used  . Alcohol Use: Not on file   Objective:   BP 122/70 mmHg  Pulse 68  Temp(Src) 98.2 F (36.8 C) (Oral)  Resp 16  Wt 192 lb (87.091 kg)  Physical Exam  Constitutional: She is oriented to person, place, and time. She appears well-developed and well-nourished.  Neurological: She is alert and oriented to person, place, and time.  Psychiatric: She has a normal mood and affect. Her behavior is normal. Judgment and thought content normal.      Assessment & Plan:     1. Essential hypertension Condition is stable. Please continue current medication and  plan of care as noted.    2. Type 2 diabetes mellitus without complication, without long-term current use of insulin (HCC) New problem.  Discussed healthy lifestyle changes.  Will work on eating healthy  and exercise.  Taught how to check sugars today and monitor.   Recheck in 3 months.     Patient was seen and examined by Leo Grosser, MD, and note scribed by Kavin Leech, CMA.  I have reviewed the document for accuracy and completeness and I agree with above. - Leo Grosser, MD     Lorie Phenix, MD  [facility]

## 2015-06-16 ENCOUNTER — Other Ambulatory Visit: Payer: Self-pay | Admitting: Family Medicine

## 2015-06-16 DIAGNOSIS — E119 Type 2 diabetes mellitus without complications: Secondary | ICD-10-CM

## 2015-06-16 MED ORDER — GLUCOSE BLOOD VI STRP
ORAL_STRIP | Status: DC
Start: 2015-06-16 — End: 2015-06-24

## 2015-06-16 MED ORDER — ONETOUCH DELICA LANCETS FINE MISC: Status: DC

## 2015-06-16 NOTE — Telephone Encounter (Signed)
Pt states Dr Elease Hashimoto gave her a One Touch Verio flex meter and pt is needing test strips and lancets for this meter.  [facility].  [phone]

## 2015-06-18 ENCOUNTER — Telehealth: Payer: Self-pay | Admitting: Family Medicine

## 2015-06-18 DIAGNOSIS — E119 Type 2 diabetes mellitus without complications: Secondary | ICD-10-CM

## 2015-06-18 NOTE — Telephone Encounter (Signed)
Pt states the [employer] has told pt Medicare or BCBS will not cover the glucose blood (ONETOUCH VERIO) test strips and ONETOUCH DELICA LANCETS .  Medicare says BCBS should pay and BCBS says Medicare should pay.  Pt states she is completely out and is not sure what she needs to do to get these covered by insurance.  ZO#[phone]

## 2015-06-18 NOTE — Telephone Encounter (Signed)
Will call for a PA.  Thanks,   -Vernona Rieger

## 2015-06-20 NOTE — Telephone Encounter (Signed)
Per pt's insurance she does not need a PA.  LMTCB to advise pt.  Thanks,   -Vernona Rieger

## 2015-06-23 NOTE — Telephone Encounter (Signed)
Brooki from [employer] called about test strips. States she ran the prescription through Medicare part D, which was denied and advised pharmacy to run through Medicare part B. When ran through part B, it was denied again and informed pharmacy to run through Part D. Brooki states this DOES need a PA, and is faxing the form. Allene Dillon, CMA

## 2015-06-24 MED ORDER — ONETOUCH DELICA LANCETS FINE MISC: Status: DC

## 2015-06-24 MED ORDER — GLUCOSE BLOOD VI STRP
ORAL_STRIP | Status: DC
Start: 2015-06-24 — End: 2016-02-03

## 2015-06-24 NOTE — Telephone Encounter (Signed)
Needs to call her insurance company first and find out which brand of monitor and strips that they will pay for first. Thanks.

## 2015-06-24 NOTE — Telephone Encounter (Signed)
Pt advised.  She would like to try a different pharmacy.  Will send to [facility] [address]   Thanks,   -Vernona Rieger

## 2015-06-24 NOTE — Telephone Encounter (Signed)
Left message advising pt to call her insurance company to see what brand (test strips and lancets) they will cover.   Thanks,   -Vernona Rieger

## 2015-06-26 DIAGNOSIS — E119 Type 2 diabetes mellitus without complications: Secondary | ICD-10-CM | POA: Diagnosis not present

## 2015-07-01 ENCOUNTER — Telehealth: Payer: Self-pay | Admitting: Family Medicine

## 2015-07-01 NOTE — Telephone Encounter (Signed)
Has not seen form. We could change her medication to something on her formulary. Thanks.

## 2015-07-01 NOTE — Telephone Encounter (Signed)
Did you see any faxes about this?  Thanks,   -Vernona RiegerLaura

## 2015-07-01 NOTE — Telephone Encounter (Signed)
Pt stated that she was notified by [employer] that her atenolol-chlorthalidone (TENORETIC) 50-25 MG tablet would no longer be covered. Pt stated she was advised that [employer] was going to fax us a form and we would have 72 hours to respond to get the medication covered for her. Please advise. Thanks TNP

## 2015-07-02 NOTE — Telephone Encounter (Signed)
Sp[facility] with [facility]ist [facility] [place] and she reports that patient just had medication filled and if we were to do a PA now, it would reject. She reports that she will send over PA form at the appropriate time before patient runs out of medication. L/M for patient to explain what is  Going on.

## 2015-09-08 ENCOUNTER — Ambulatory Visit (INDEPENDENT_AMBULATORY_CARE_PROVIDER_SITE_OTHER): Payer: Medicare Other | Admitting: Family Medicine

## 2015-09-08 ENCOUNTER — Encounter: Payer: Self-pay | Admitting: Family Medicine

## 2015-09-08 VITALS — BP 116/70 | HR 68 | Temp 97.5°F | Resp 16 | Wt 189.0 lb

## 2015-09-08 DIAGNOSIS — E119 Type 2 diabetes mellitus without complications: Secondary | ICD-10-CM

## 2015-09-08 DIAGNOSIS — E038 Other specified hypothyroidism: Secondary | ICD-10-CM | POA: Diagnosis not present

## 2015-09-08 DIAGNOSIS — I10 Essential (primary) hypertension: Secondary | ICD-10-CM

## 2015-09-08 DIAGNOSIS — R202 Paresthesia of skin: Secondary | ICD-10-CM | POA: Diagnosis not present

## 2015-09-08 DIAGNOSIS — E7800 Pure hypercholesterolemia, unspecified: Secondary | ICD-10-CM

## 2015-09-08 DIAGNOSIS — E039 Hypothyroidism, unspecified: Secondary | ICD-10-CM

## 2015-09-08 DIAGNOSIS — R200 Anesthesia of skin: Secondary | ICD-10-CM

## 2015-09-08 LAB — POCT GLYCOSYLATED HEMOGLOBIN (HGB A1C): Hemoglobin A1C: 6.6

## 2015-09-08 NOTE — Progress Notes (Signed)
Patient ID: Nicole Lynn, female   DOB: November 22, 1940, 75 y.o.   MRN: [id number]         Patient: Nicole Lynn Female    DOB: 1940-07-07   75 y.o.   MRN: [id number] Visit Date: 09/08/2015  Today's Provider: Lorie Phenix, MD   Chief Complaint  Patient presents with  . Diabetes  . Hypertension  . Hyperlipidemia   Subjective:    HPI   Diabetes Mellitus Type II, Follow-up:   Lab Results  Component Value Date   HGBA1C 6.6 09/08/2015   HGBA1C 6.9* 05/27/2015   Last seen for diabetes 3 months ago.  Management since then includes None. She reports excellent compliance with treatment. She is not having side effects.  Current symptoms include none and have been stable. Home blood sugar records: Low to mid 100's  Episodes of hypoglycemia? no   Most Recent Eye Exam: Has an appointment Wednesday Weight trend: decreasing steadily Prior visit with dietician: no Current diet: in general, a "healthy" diet   Current exercise: walking  ------------------------------------------------------------------------   Hypertension, follow-up:  BP Readings from Last 3 Encounters:  09/08/15 116/70  06/06/15 122/70  05/16/15 124/64    She was last seen for hypertension 3 months ago.  Management since that visit includes None .She reports excellent compliance with treatment. She is not having side effects.  She is exercising. She is adherent to low salt diet.   Outside blood pressures are Pt does not check her blood pressure outside. She is experiencing fatigue.  Patient denies chest pain, irregular heart beat, lower extremity edema, near-syncope, palpitations and syncope.   Cardiovascular risk factors include advanced age (older than 46 for men, 67 for women), diabetes mellitus, dyslipidemia and obesity (BMI >= 30 kg/m2).  ------------------------------------------------------------------------    Lipid/Cholesterol, Follow-up:   Last seen for this 3 months ago.    Management since that visit includes None.  Last Lipid Panel:    Component Value Date/Time   CHOL 248* 05/27/2015 0828   TRIG 225* 05/27/2015 0828   HDL 54 05/27/2015 0828   LDLCALC 149* 05/27/2015 0828    She reports excellent compliance with treatment. She is not having side effects.   Wt Readings from Last 3 Encounters:  09/08/15 189 lb (85.73 kg)  06/06/15 192 lb (87.091 kg)  05/16/15 192 lb (87.091 kg)    ------------------------------------------------------------------------       Allergies  Allergen Reactions  . Latex    Previous Medications   ATENOLOL-CHLORTHALIDONE (TENORETIC) 50-25 MG TABLET    take 1 tablet by mouth once daily   FISH OIL-OMEGA-3 FATTY ACIDS 1000 MG CAPSULE    Take 2 g by mouth daily.   GLUCOSE BLOOD (ONETOUCH VERIO) TEST STRIP    To check blood sugar once a day.  DX: E11.9   LATANOPROST (XALATAN) 0.005 % OPHTHALMIC SOLUTION       MULTIPLE VITAMINS-MINERALS (CENTRUM SILVER PO)    Take by mouth once.   NITROFURANTOIN, MACROCRYSTAL-MONOHYDRATE, (MACROBID) 100 MG CAPSULE    take 1 capsule by mouth once daily if needed   ONETOUCH DELICA LANCETS FINE MISC    To check blood sugars once a day.  DX: E11.9   PHENAZOPYRIDINE (PYRIDIUM) 200 MG TABLET    Take 1 tablet (200 mg total) by mouth 3 (three) times daily as needed for pain.   ZOLPIDEM (AMBIEN) 10 MG TABLET    Take 1 tablet (10 mg total) by mouth at bedtime.    Review of Systems  Constitutional:  Positive for fatigue. Negative for fever, chills, diaphoresis, activity change, appetite change and unexpected weight change.  Respiratory: Negative.   Cardiovascular: Negative.   Gastrointestinal: Negative.   Musculoskeletal: Negative.   Neurological: Positive for numbness (Some numbness in her arm especially at night. ). Negative for dizziness, tremors, weakness, light-headedness and headaches.    Social History  Substance Use Topics  . Smoking status: Never Smoker   . Smokeless tobacco: Never  Used  . Alcohol Use: Not on file   Objective:   BP 116/70 mmHg  Pulse 68  Temp(Src) 97.5 F (36.4 C) (Oral)  Resp 16  Wt 189 lb (85.73 kg)  Results for orders placed or performed in visit on 09/08/15  POCT glycosylated hemoglobin (Hb A1C)  Result Value Ref Range   Hemoglobin A1C 6.6    Physical Exam  Constitutional: She is oriented to person, place, and time. She appears well-developed and well-nourished.  Cardiovascular: Normal rate, regular rhythm and normal heart sounds.   Pulmonary/Chest: Effort normal and breath sounds normal.  Neurological: She is alert and oriented to person, place, and time.  Skin: Skin is warm and dry.  Psychiatric: She has a normal mood and affect. Her behavior is normal. Judgment and thought content normal.      Assessment & Plan:      1. Essential hypertension Stable continue current medications.    2. Type 2 diabetes mellitus without complication, without long-term current use of insulin (HCC) A1C improved at 6.6%; continue current lifestyle changes and [facility]  - POCT glycosylated hemoglobin (Hb A1C)  3. Hypercholesteremia Pt does not want to go on Cholesterol medication at this time.  Will continue working on lifestyle changes.    4. Subclinical hypothyroidism Fatigue worsening in the last few months.  Will recheck labs.  - T4 AND TSH - Vitamin B12  5. Numbness and tingling Worsening in the last few months will check labs.  - Vitamin B12  Patient was seen and examined by Leo GrosserNancy J. Pallie Swigert, MD, and note scribed by Kavin LeechLaura Walsh, CMA.  I have reviewed the document for accuracy and completeness and I agree with above. - Leo GrosserNancy J. Dontasia Miranda, MD      Lorie PhenixNancy Ailine Hefferan, MD  [facility]

## 2015-09-10 DIAGNOSIS — H40153 Residual stage of open-angle glaucoma, bilateral: Secondary | ICD-10-CM | POA: Diagnosis not present

## 2015-10-20 DIAGNOSIS — Z1231 Encounter for screening mammogram for malignant neoplasm of breast: Secondary | ICD-10-CM | POA: Diagnosis not present

## 2015-10-20 DIAGNOSIS — Z6834 Body mass index (BMI) 34.0-34.9, adult: Secondary | ICD-10-CM | POA: Diagnosis not present

## 2015-10-20 DIAGNOSIS — I10 Essential (primary) hypertension: Secondary | ICD-10-CM | POA: Diagnosis not present

## 2015-10-20 DIAGNOSIS — B009 Herpesviral infection, unspecified: Secondary | ICD-10-CM | POA: Diagnosis not present

## 2015-10-20 DIAGNOSIS — L259 Unspecified contact dermatitis, unspecified cause: Secondary | ICD-10-CM | POA: Diagnosis not present

## 2015-10-20 DIAGNOSIS — Z01419 Encounter for gynecological examination (general) (routine) without abnormal findings: Secondary | ICD-10-CM | POA: Diagnosis not present

## 2015-11-10 DIAGNOSIS — E119 Type 2 diabetes mellitus without complications: Secondary | ICD-10-CM | POA: Diagnosis not present

## 2015-12-15 DIAGNOSIS — L570 Actinic keratosis: Secondary | ICD-10-CM | POA: Diagnosis not present

## 2015-12-15 DIAGNOSIS — L858 Other specified epidermal thickening: Secondary | ICD-10-CM | POA: Diagnosis not present

## 2016-01-05 ENCOUNTER — Ambulatory Visit: Payer: Medicare Other | Admitting: Physician Assistant

## 2016-01-05 ENCOUNTER — Encounter: Payer: Self-pay | Admitting: Physician Assistant

## 2016-01-05 ENCOUNTER — Ambulatory Visit (INDEPENDENT_AMBULATORY_CARE_PROVIDER_SITE_OTHER): Payer: Medicare Other | Admitting: Physician Assistant

## 2016-01-05 VITALS — BP 120/70 | HR 68 | Temp 97.7°F | Resp 16 | Wt 190.8 lb

## 2016-01-05 DIAGNOSIS — I10 Essential (primary) hypertension: Secondary | ICD-10-CM | POA: Diagnosis not present

## 2016-01-05 DIAGNOSIS — E119 Type 2 diabetes mellitus without complications: Secondary | ICD-10-CM

## 2016-01-05 DIAGNOSIS — R21 Rash and other nonspecific skin eruption: Secondary | ICD-10-CM

## 2016-01-05 DIAGNOSIS — E7800 Pure hypercholesterolemia, unspecified: Secondary | ICD-10-CM

## 2016-01-05 DIAGNOSIS — Z23 Encounter for immunization: Secondary | ICD-10-CM

## 2016-01-05 LAB — POCT GLYCOSYLATED HEMOGLOBIN (HGB A1C)
Est. average glucose Bld gHb Est-mCnc: 134
Hemoglobin A1C: 6.3

## 2016-01-05 MED ORDER — TRIAMCINOLONE ACETONIDE 0.1 % EX CREA: 1.0000 "application " | TOPICAL_CREAM | Freq: Two times a day (BID) | CUTANEOUS | 0 refills | Status: DC

## 2016-01-05 NOTE — Progress Notes (Signed)
Patient: Nicole Lynn Female    DOB: [birth year]   75 y.o.   MRN: [id number] Visit Date: 01/05/2016  Today's Provider: [name], PA-C   Chief Complaint  Patient presents with  . Follow-up    HTN, Hypercholesteremia, Diabetes   Subjective:    HPI  Diabetes Mellitus Type II, Follow-up:   Lab Results  Component Value Date   HGBA1C 6.6 09/08/2015   HGBA1C 6.9 (H) 05/27/2015   Last seen for diabetes 4 months ago.  Management since then includes None. She reports excellent compliance with treatment. She is not having side effects.  Current symptoms include none and have been stable. She feels more fatigue. She reports that she used to be able to work in her yard with any problem.Now she feels that she is getting tired fast. She reports that it might be because she is getting older. Home blood sugar records: 120's-140's  Episodes of hypoglycemia? no   Most Recent Eye Exam: UTD Current diet: in general, a "healthy" diet   Current exercise: walking  ------------------------------------------------------------------------   Hypertension, follow-up:  BP Readings from Last 3 Encounters:  01/05/16 120/70  09/08/15 116/70  06/06/15 122/70    She was last seen for hypertension 4 months ago.  BP at that visit was 116/70. Management since that visit includes None.She reports excellent compliance with treatment. She is not having side effects.  She is exercising. She is adherent to low salt diet.   Outside blood pressures are n/a. She is experiencing fatigue and numbness on hands to her fingers only at night.  Patient denies chest pain, chest pressure/discomfort, exertional chest pressure/discomfort, irregular heart beat and palpitations.   Cardiovascular risk factors include advanced age (older than 47 for men, 22 for women), diabetes mellitus, dyslipidemia and obesity (BMI >= 30 kg/m2).  She needs refill on the onetouch verio test  srtip. ------------------------------------------------------------------------    Lipid/Cholesterol, Follow-up:   Last seen for this 4 months ago.  Management since that visit includes work on lifestyle changes.  Last Lipid Panel:    Component Value Date/Time   CHOL 248 (H) 05/27/2015 0828   TRIG 225 (H) 05/27/2015 0828   HDL 54 05/27/2015 0828   LDLCALC 149 (H) 05/27/2015 4098    She reports excellent compliance with treatment. She is not having side effects.   Wt Readings from Last 3 Encounters:  01/05/16 190 lb 12.8 oz (86.5 kg)  09/08/15 189 lb (85.7 kg)  06/06/15 192 lb (87.1 kg)    ------------------------------------------------------------------------     Allergies  Allergen Reactions  . Latex      Current Outpatient Prescriptions:  .  atenolol-chlorthalidone (TENORETIC) 50-25 MG tablet, take 1 tablet by mouth once daily, Disp: 90 tablet, Rfl: 0 .  fish oil-omega-3 fatty acids 1000 MG capsule, Take 2 g by mouth daily., Disp: , Rfl:  .  glucose blood (ONETOUCH VERIO) test strip, To check blood sugar once a day.  DX: E11.9, Disp: 100 each, Rfl: 1 .  latanoprost (XALATAN) 0.005 % ophthalmic solution, , Disp: , Rfl: 0 .  Multiple Vitamins-Minerals (CENTRUM SILVER PO), Take by mouth once., Disp: , Rfl:  .  nitrofurantoin, macrocrystal-monohydrate, (MACROBID) 100 MG capsule, take 1 capsule by mouth once daily if needed, Disp: 30 capsule, Rfl: 6 .  ONETOUCH DELICA LANCETS FINE MISC, To check blood sugars once a day.  DX: E11.9, Disp: 100 each, Rfl: 1 .  phenazopyridine (PYRIDIUM) 200 MG tablet, Take 1 tablet (200  mg total) by mouth 3 (three) times daily as needed for pain., Disp: 10 tablet, Rfl: 1 .  zolpidem (AMBIEN) 10 MG tablet, Take 1 tablet (10 mg total) by mouth at bedtime. (Patient taking differently: Take 10 mg by mouth at bedtime as needed. ), Disp: 30 tablet, Rfl: 5  Review of Systems  Constitutional: Positive for fatigue.  HENT: Negative.   Eyes:  Negative.   Respiratory: Negative.   Cardiovascular: Positive for leg swelling (this is chronic). Negative for chest pain and palpitations.  Gastrointestinal: Negative.   Endocrine: Negative.   Genitourinary: Negative.   Musculoskeletal: Negative.   Skin: Negative.   Allergic/Immunologic: Negative.   Neurological: Positive for numbness (in hands at night; only occasionally).  Hematological: Negative.   Psychiatric/Behavioral: Negative.     Social History  Substance Use Topics  . Smoking status: Never Smoker  . Smokeless tobacco: Never Used  . Alcohol use Not on file   Objective:   BP 120/70 (BP Location: Left Arm, Patient Position: Sitting, Cuff Size: Large)   Pulse 68   Temp 97.7 F (36.5 C) (Oral)   Resp 16   Wt 190 lb 12.8 oz (86.5 kg)   BMI 36.65 kg/m   Physical Exam  Constitutional: She appears well-developed and well-nourished. No distress.  Neck: Normal range of motion. Neck supple. No JVD present. No tracheal deviation present. No thyromegaly present.  Cardiovascular: Normal rate, regular rhythm and normal heart sounds.  Exam reveals no gallop and no friction rub.   No murmur heard. Pulmonary/Chest: Effort normal and breath sounds normal. No respiratory distress. She has no wheezes. She has no rales.  Musculoskeletal: She exhibits edema (trace).  Lymphadenopathy:    She has no cervical adenopathy.  Skin: Rash noted. She is not diaphoretic.     Psychiatric: She has a normal mood and affect. Her behavior is normal. Judgment and thought content normal.  Vitals reviewed.     Assessment & Plan:     1. Essential hypertension Stable. Continue current medical treatment plan.  2. Type 2 diabetes mellitus without complication, without long-term current use of insulin (HCC) Improved to 6.3. Continue lifestyle modification. Will recheck in 3 months. - POCT glycosylated hemoglobin (Hb A1C)  3. Hypercholesteremia Stable. Patient refuses statin therapy.  4.  Rash Rash located on left ankle that looks similar to eczema. Will give triamcinolone cream as below.  - triamcinolone cream (KENALOG) 0.1 %; Apply 1 application topically 2 (two) times daily.  Dispense: 30 g; Refill: 0  5. Need for influenza vaccination Flu vaccine given today without complication. Patient sat upright for 15 minutes to check for adverse reaction before being released. - Flu vaccine HIGH DOSE PF       Margaretann LovelessJennifer M Keianna Signer, PA-C  [facility]

## 2016-01-05 NOTE — Patient Instructions (Signed)

## 2016-01-19 DIAGNOSIS — H40153 Residual stage of open-angle glaucoma, bilateral: Secondary | ICD-10-CM | POA: Diagnosis not present

## 2016-02-03 ENCOUNTER — Other Ambulatory Visit: Payer: Self-pay | Admitting: Family Medicine

## 2016-02-03 DIAGNOSIS — E119 Type 2 diabetes mellitus without complications: Secondary | ICD-10-CM | POA: Diagnosis not present

## 2016-02-24 ENCOUNTER — Telehealth: Payer: Self-pay

## 2016-02-24 NOTE — Telephone Encounter (Signed)
Left Message to call back-Re: Prior Authorization was denied also Atenolol was back order. If patient wants to come in a talk with [facility] she can. Insurance is suggesting patient to try some other medication.  Thanks,  -Kymani Shimabukuro

## 2016-02-26 NOTE — Telephone Encounter (Signed)
Spoke with patient and advised her of message below, she stats that she has been on medication for years and does not understand why insurance will not cover it. Patient states she will see how much insurance will cover just on Atenolol and will give our office a call back with an update of what she would like to do since this denial. Renette ButtersKW

## 2016-02-26 NOTE — Telephone Encounter (Signed)
LMTCB-KW 

## 2016-04-12 ENCOUNTER — Ambulatory Visit (INDEPENDENT_AMBULATORY_CARE_PROVIDER_SITE_OTHER): Payer: Medicare Other | Admitting: Physician Assistant

## 2016-04-12 ENCOUNTER — Encounter: Payer: Self-pay | Admitting: Physician Assistant

## 2016-04-12 VITALS — BP 138/72 | HR 88 | Resp 16 | Wt 191.0 lb

## 2016-04-12 DIAGNOSIS — I10 Essential (primary) hypertension: Secondary | ICD-10-CM

## 2016-04-12 DIAGNOSIS — E119 Type 2 diabetes mellitus without complications: Secondary | ICD-10-CM

## 2016-04-12 DIAGNOSIS — M21612 Bunion of left foot: Secondary | ICD-10-CM | POA: Diagnosis not present

## 2016-04-12 LAB — POCT GLYCOSYLATED HEMOGLOBIN (HGB A1C)
Est. average glucose Bld gHb Est-mCnc: 146
Hemoglobin A1C: 6.7

## 2016-04-12 MED ORDER — GLUCOSE BLOOD VI STRP: ORAL_STRIP | 5 refills | Status: DC

## 2016-04-12 MED ORDER — METFORMIN HCL 500 MG PO TABS: 500.0000 mg | ORAL_TABLET | Freq: Every day | ORAL | 0 refills | Status: DC

## 2016-04-12 MED ORDER — ATENOLOL-CHLORTHALIDONE 50-25 MG PO TABS: 1.0000 | ORAL_TABLET | Freq: Every day | ORAL | 3 refills | Status: DC

## 2016-04-12 NOTE — Patient Instructions (Signed)
Metformin tablets What is this medicine? METFORMIN (met FOR min) is used to treat type 2 diabetes. It helps to control blood sugar. Treatment is combined with diet and exercise. This medicine can be used alone or with other medicines for diabetes. This medicine may be used for other purposes; ask your health care provider or pharmacist if you have questions. COMMON BRAND NAME(S): Glucophage What should I tell my health care provider before I take this medicine? They need to know if you have any of these conditions: -anemia -frequently drink alcohol-containing beverages -become easily dehydrated -heart attack -heart failure that is treated with medications -kidney disease -liver disease -polycystic ovary syndrome -serious infection or injury -vomiting -an unusual or allergic reaction to metformin, other medicines, foods, dyes, or preservatives -pregnant or trying to get pregnant -breast-feeding How should I use this medicine? Take this medicine by mouth. Take it with meals. Swallow the tablets with a drink of water. Follow the directions on the prescription label. Take your medicine at regular intervals. Do not take your medicine more often than directed. Talk to your pediatrician regarding the use of this medicine in children. While this drug may be prescribed for children as young as 50 years of age for selected conditions, precautions do apply. Overdosage: If you think you have taken too much of this medicine contact a poison control center or emergency room at once. NOTE: This medicine is only for you. Do not share this medicine with others. What if I miss a dose? If you miss a dose, take it as soon as you can. If it is almost time for your next dose, take only that dose. Do not take double or extra doses. What may interact with this medicine? Do not take this medicine with any of the following medications: -dofetilide -gatifloxacin -certain contrast medicines given before X-rays,  CT scans, MRI, or other procedures This medicine may also interact with the following medications: -acetazolamide -certain medicines for HIV infection or hepatitis, like adefovir, emtricitabine, entecavir, lamivudine, or tenofovir -cimetidine -crizotinib -digoxin -diuretics -female hormones, like estrogens or progestins and birth control pills -glycopyrrolate -isoniazid -lamotrigine -medicines for blood pressure, heart disease, irregular heart beat -memantine -midodrine -methazolamide -morphine -nicotinic acid -phenothiazines like chlorpromazine, mesoridazine, prochlorperazine, thioridazine -phenytoin -procainamide -propantheline -quinidine -quinine -ranitidine -ranolazine -steroid medicines like prednisone or cortisone -stimulant medicines for attention disorders, weight loss, or to stay awake -thyroid medicines -topiramate -trimethoprim -trospium -vancomycin -vandetanib -zonisamide This list may not describe all possible interactions. Give your health care provider a list of all the medicines, herbs, non-prescription drugs, or dietary supplements you use. Also tell them if you smoke, drink alcohol, or use illegal drugs. Some items may interact with your medicine. What should I watch for while using this medicine? Visit your doctor or health care professional for regular checks on your progress. A test called the HbA1C (A1C) will be monitored. This is a simple blood test. It measures your blood sugar control over the last 2 to 3 months. You will receive this test every 3 to 6 months. Learn how to check your blood sugar. Learn the symptoms of low and high blood sugar and how to manage them. Always carry a quick-source of sugar with you in case you have symptoms of low blood sugar. Examples include hard sugar candy or glucose tablets. Make sure others know that you can choke if you eat or drink when you develop serious symptoms of low blood sugar, such as seizures or  unconsciousness. They must get medical  help at once. Tell your doctor or health care professional if you have high blood sugar. You might need to change the dose of your medicine. If you are sick or exercising more than usual, you might need to change the dose of your medicine. Do not skip meals. Ask your doctor or health care professional if you should avoid alcohol. Many nonprescription cough and cold products contain sugar or alcohol. These can affect blood sugar. This medicine may cause ovulation in premenopausal women who do not have regular monthly periods. This may increase your chances of becoming pregnant. You should not take this medicine if you become pregnant or think you may be pregnant. Talk with your doctor or health care professional about your birth control options while taking this medicine. Contact your doctor or health care professional right away if think you are pregnant. If you are going to need surgery, a MRI, CT scan, or other procedure, tell your doctor that you are taking this medicine. You may need to stop taking this medicine before the procedure. Wear a medical ID bracelet or chain, and carry a card that describes your disease and details of your medicine and dosage times. What side effects may I notice from receiving this medicine? Side effects that you should report to your doctor or health care professional as soon as possible: -allergic reactions like skin rash, itching or hives, swelling of the face, lips, or tongue -breathing problems -feeling faint or lightheaded, falls -muscle aches or pains -signs and symptoms of low blood sugar such as feeling anxious, confusion, dizziness, increased hunger, unusually weak or tired, sweating, shakiness, cold, irritable, headache, blurred vision, fast heartbeat, loss of consciousness -slow or irregular heartbeat -unusual stomach pain or discomfort -unusually tired or weak Side effects that usually do not require medical  attention (report to your doctor or health care professional if they continue or are bothersome): -diarrhea -headache -heartburn -metallic taste in mouth -nausea -stomach gas, upset This list may not describe all possible side effects. Call your doctor for medical advice about side effects. You may report side effects to FDA at 1-800-FDA-1088. Where should I keep my medicine? Keep out of the reach of children. Store at room temperature between 15 and 30 degrees C (59 and 86 degrees F). Protect from moisture and light. Throw away any unused medicine after the expiration date. NOTE: This sheet is a summary. It may not cover all possible information. If you have questions about this medicine, talk to your doctor, pharmacist, or health care provider.  2017 [employer]vier/Gold Standard (2013-09-25 22:14:40)

## 2016-04-12 NOTE — Progress Notes (Signed)
Patient: Nicole Lynn Female    DOB: 1941/04/20   75 y.o.   MRN: [id number] Visit Date: 04/12/2016  Today's Provider: Margaretann LovelessJennifer M Lennex Pietila, PA-C   Chief Complaint  Patient presents with  . Diabetes  . Hypertension   Subjective:    HPI      Diabetes Mellitus Type II, Follow-up:   Lab Results  Component Value Date   HGBA1C 6.7 04/12/2016   HGBA1C 6.3 01/05/2016   HGBA1C 6.6 09/08/2015    Last seen for diabetes 3 months ago.  Management since then includes advising pt to continue lifestyle modifications and current medications. She reports good compliance with treatment. She is not having side effects. Current symptoms include foot ulcerations and have been stable. Home blood sugar records: fasting range: 132-151  Episodes of hypoglycemia? no   Most Recent Eye Exam: 2 months ago Weight trend: stable Prior visit with dietician: no Current diet: in general, a "healthy" diet   Current exercise: yard work  Pertinent Labs:    Component Value Date/Time   CHOL 248 (H) 05/27/2015 0828   TRIG 225 (H) 05/27/2015 0828   HDL 54 05/27/2015 0828   LDLCALC 149 (H) 05/27/2015 0828   CREATININE 0.65 05/27/2015 0828   CREATININE 0.54 (L) 06/21/2013 0809    Wt Readings from Last 3 Encounters:  04/12/16 191 lb (86.6 kg)  01/05/16 190 lb 12.8 oz (86.5 kg)  09/08/15 189 lb (85.7 kg)    ------------------------------------------------------------------------  Hypertension, follow-up:  BP Readings from Last 3 Encounters:  04/12/16 138/72  01/05/16 120/70  09/08/15 116/70    She was last seen for hypertension 3 months ago.  BP at that visit was 120/70. Management since that visit includes none. She reports good compliance with treatment. Did forget to take BP medication today. She is not having side effects.  She is exercising. She is not adherent to low salt diet.   Outside blood pressures are not being checked. She is experiencing none.  Patient  denies chest pain, chest pressure/discomfort, dyspnea, exertional chest pressure/discomfort and fatigue.   Cardiovascular risk factors include advanced age (older than 7855 for men, 6065 for women), diabetes mellitus, dyslipidemia, hypertension and obesity (BMI >= 30 kg/m2).      Weight trend: stable Wt Readings from Last 3 Encounters:  04/12/16 191 lb (86.6 kg)  01/05/16 190 lb 12.8 oz (86.5 kg)  09/08/15 189 lb (85.7 kg)    Current diet: in general, a "healthy" diet    ------------------------------------------------------------------------   Allergies  Allergen Reactions  . Latex      Current Outpatient Prescriptions:  .  atenolol-chlorthalidone (TENORETIC) 50-25 MG tablet, take 1 tablet by mouth once daily, Disp: 90 tablet, Rfl: 0 .  fish oil-omega-3 fatty acids 1000 MG capsule, Take 2 g by mouth daily., Disp: , Rfl:  .  latanoprost (XALATAN) 0.005 % ophthalmic solution, , Disp: , Rfl: 0 .  Multiple Vitamins-Minerals (CENTRUM SILVER PO), Take by mouth once., Disp: , Rfl:  .  nitrofurantoin, macrocrystal-monohydrate, (MACROBID) 100 MG capsule, take 1 capsule by mouth once daily if needed, Disp: 30 capsule, Rfl: 6 .  ONETOUCH DELICA LANCETS FINE MISC, To check blood sugars once a day.  DX: E11.9, Disp: 100 each, Rfl: 1 .  ONETOUCH VERIO test strip, TEST once daily TO CHECK BLOOD SUGAR, Disp: 100 each, Rfl: 1 .  phenazopyridine (PYRIDIUM) 200 MG tablet, Take 1 tablet (200 mg total) by mouth 3 (three) times daily as needed for  pain., Disp: 10 tablet, Rfl: 1 .  triamcinolone cream (KENALOG) 0.1 %, Apply 1 application topically 2 (two) times daily., Disp: 30 g, Rfl: 0 .  zolpidem (AMBIEN) 10 MG tablet, Take 1 tablet (10 mg total) by mouth at bedtime. (Patient taking differently: Take 10 mg by mouth at bedtime as needed. ), Disp: 30 tablet, Rfl: 5  Review of Systems  Constitutional: Negative for activity change, appetite change, chills, diaphoresis, fatigue, fever and unexpected weight  change.  Respiratory: Negative for cough, chest tightness and shortness of breath.   Cardiovascular: Negative for chest pain.  Gastrointestinal: Negative for abdominal pain.  Endocrine: Negative for polydipsia, polyphagia and polyuria.  Skin: Positive for rash (LLE).  Neurological: Positive for numbness (arms fall asleep during the night). Negative for weakness.    Social History  Substance Use Topics  . Smoking status: Never Smoker  . Smokeless tobacco: Never Used  . Alcohol use 1.8 oz/week    3 Glasses of wine per week   Objective:   BP 138/72 (BP Location: Left Arm, Patient Position: Sitting, Cuff Size: Large) Comment: Forgot to take BP medication  Pulse 88   Resp 16   Wt 191 lb (86.6 kg)   BMI 36.69 kg/m   Physical Exam  Constitutional: She appears well-developed and well-nourished. No distress.  Neck: Normal range of motion. Neck supple. No JVD present. No tracheal deviation present. No thyromegaly present.  Cardiovascular: Normal rate, regular rhythm and normal heart sounds.  Exam reveals no gallop and no friction rub.   No murmur heard. Pulmonary/Chest: Effort normal and breath sounds normal. No respiratory distress. She has no wheezes. She has no rales.  Musculoskeletal:  Bilateral feet have very dry, cracked skin. No wounds noted, but patient having tenderness on the left great toe that requires her to wear a band-aid for comfort. She is unable to see the bottom of her feet.   Lymphadenopathy:    She has no cervical adenopathy.  Skin: She is not diaphoretic.  Vitals reviewed.      Assessment & Plan:     1. Type 2 diabetes mellitus without complication, without long-term current use of insulin (HCC) A1c increased to 6.7. Patient is willing to start medication at this time. Will add metformin 500mg  once daily to make sure she tolerates. Will check A1c when she returns in 2-3 months for her AWV. I am also referring her to podiatry for foot care due to the callous, dry  cracking skin and the fact that she is diabetic and cannot see the bottom of her feet. Referral placed as below. I will see her back (per patient request) at the end of Feb or early March instead of January. She is to call for any acute issue in the meantime.  - POCT glycosylated hemoglobin (Hb A1C) - metFORMIN (GLUCOPHAGE) 500 MG tablet; Take 1 tablet (500 mg total) by mouth daily with breakfast.  Dispense: 30 tablet; Refill: 0 - Ambulatory referral to Podiatry - glucose blood (ONETOUCH VERIO) test strip; Use to check blood sugar once daily  Dispense: 100 each; Refill: 5  2. Bunion of great toe of left foot See above medical treatment plan. - Ambulatory referral to Podiatry  3. Essential hypertension Stable. Diagnosis pulled for medication refill. Continue current medical treatment plan. - atenolol-chlorthalidone (TENORETIC) 50-25 MG tablet; Take 1 tablet by mouth daily.  Dispense: 90 tablet; Refill: 3  Patient seen and examined by Margaretann LovelessJennifer M Farra Nikolic, PA-C, and note scribed by Allene DillonEmily Drozdowski, CMA.  Mar Daring, PA-C  [facility]

## 2016-04-30 ENCOUNTER — Ambulatory Visit: Payer: Medicare Other | Admitting: Podiatry

## 2016-05-09 ENCOUNTER — Other Ambulatory Visit: Payer: Self-pay | Admitting: Physician Assistant

## 2016-05-09 DIAGNOSIS — E119 Type 2 diabetes mellitus without complications: Secondary | ICD-10-CM

## 2016-05-10 DIAGNOSIS — H40153 Residual stage of open-angle glaucoma, bilateral: Secondary | ICD-10-CM | POA: Diagnosis not present

## 2016-05-14 ENCOUNTER — Ambulatory Visit (INDEPENDENT_AMBULATORY_CARE_PROVIDER_SITE_OTHER): Payer: Medicare Other | Admitting: Podiatry

## 2016-05-14 VITALS — BP 128/70 | HR 72 | Resp 16

## 2016-05-14 DIAGNOSIS — E0843 Diabetes mellitus due to underlying condition with diabetic autonomic (poly)neuropathy: Secondary | ICD-10-CM | POA: Diagnosis not present

## 2016-05-14 DIAGNOSIS — M79609 Pain in unspecified limb: Secondary | ICD-10-CM | POA: Diagnosis not present

## 2016-05-14 DIAGNOSIS — B351 Tinea unguium: Secondary | ICD-10-CM | POA: Diagnosis not present

## 2016-05-14 DIAGNOSIS — L603 Nail dystrophy: Secondary | ICD-10-CM

## 2016-05-14 DIAGNOSIS — L608 Other nail disorders: Secondary | ICD-10-CM

## 2016-05-14 NOTE — Progress Notes (Signed)
   Subjective:    Patient ID: Nicole Lynn, female    DOB: [birth year], 76 y.o.   MRN: [id number]  HPI    Review of Systems     Objective:   Physical Exam        Assessment & Plan:

## 2016-05-19 ENCOUNTER — Ambulatory Visit: Payer: Medicare Other | Admitting: Physician Assistant

## 2016-05-19 ENCOUNTER — Ambulatory Visit: Payer: Medicare Other

## 2016-05-23 NOTE — Progress Notes (Signed)
Patient ID: Nicole Lynn, female   DOB: [birth year], 76 y.o.   MRN: [id number]   SUBJECTIVE Patient with a history of diabetes mellitus presents to office today complaining of elongated, thickened nails. Pain while ambulating in shoes. Patient is unable to trim their own nails.   OBJECTIVE General Patient is awake, alert, and oriented x 3 and in no acute distress. Derm Skin is dry and supple bilateral. Negative open lesions or macerations. Remaining integument unremarkable. Nails are tender, long, thickened and dystrophic with subungual debris, consistent with onychomycosis, 1-5 bilateral. No signs of infection noted. Vasc  DP and PT pedal pulses palpable bilaterally. Temperature gradient within normal limits.  Neuro Epicritic and protective threshold sensation diminished bilaterally.  Musculoskeletal Exam No symptomatic pedal deformities noted bilateral. Muscular strength within normal limits.  ASSESSMENT 1. Diabetes Mellitus w/ peripheral neuropathy 2. Onychomycosis of nail due to dermatophyte bilateral 3. Pain in foot bilateral  PLAN OF CARE 1. Patient evaluated today. 2. Instructed to maintain good pedal hygiene and foot care. Stressed importance of controlling blood sugar.  3. Mechanical debridement of nails 1-5 bilaterally performed using a nail nipper. Filed with dremel without incident.  4. Return to clinic in 3 mos.     Felecia ShellingBrent M. Norinne Jeane, DPM [facility]  Dr. Felecia ShellingBrent M. Kambrea Carrasco, DPM    [address]                                        [place], [place] [address]                Office [phone]  Fax [phone]

## 2016-05-26 ENCOUNTER — Ambulatory Visit: Payer: Medicare Other | Admitting: Physician Assistant

## 2016-06-16 ENCOUNTER — Ambulatory Visit: Payer: Medicare Other | Admitting: Physician Assistant

## 2016-06-16 ENCOUNTER — Ambulatory Visit (INDEPENDENT_AMBULATORY_CARE_PROVIDER_SITE_OTHER): Payer: Medicare Other

## 2016-06-16 VITALS — BP 126/72 | HR 72 | Temp 97.9°F | Ht 61.0 in | Wt 191.6 lb

## 2016-06-16 DIAGNOSIS — I10 Essential (primary) hypertension: Secondary | ICD-10-CM

## 2016-06-16 DIAGNOSIS — E119 Type 2 diabetes mellitus without complications: Secondary | ICD-10-CM

## 2016-06-16 DIAGNOSIS — Z1239 Encounter for other screening for malignant neoplasm of breast: Secondary | ICD-10-CM

## 2016-06-16 DIAGNOSIS — E7800 Pure hypercholesterolemia, unspecified: Secondary | ICD-10-CM

## 2016-06-16 DIAGNOSIS — Z1231 Encounter for screening mammogram for malignant neoplasm of breast: Secondary | ICD-10-CM

## 2016-06-16 DIAGNOSIS — Z0000 Encounter for general adult medical examination without abnormal findings: Secondary | ICD-10-CM

## 2016-06-16 DIAGNOSIS — E039 Hypothyroidism, unspecified: Secondary | ICD-10-CM

## 2016-06-16 DIAGNOSIS — E038 Other specified hypothyroidism: Secondary | ICD-10-CM

## 2016-06-16 LAB — POCT UA - MICROALBUMIN: Microalbumin Ur, POC: 50 mg/L

## 2016-06-16 NOTE — Patient Instructions (Signed)
Carbohydrate Counting for Diabetes Mellitus, Adult Carbohydrate counting is a method for keeping track of how many carbohydrates you eat. Eating carbohydrates naturally increases the amount of sugar (glucose) in the blood. Counting how many carbohydrates you eat helps keep your blood glucose within normal limits, which helps you manage your diabetes (diabetes mellitus). It is important to know how many carbohydrates you can safely have in each meal. This is different for every person. A diet and nutrition specialist (registered dietitian) can help you make a meal plan and calculate how many carbohydrates you should have at each meal and snack. Carbohydrates are found in the following foods:  Grains, such as breads and cereals.  Dried beans and soy products.  Starchy vegetables, such as potatoes, peas, and corn.  Fruit and fruit juices.  Milk and yogurt.  Sweets and snack foods, such as cake, cookies, candy, chips, and soft drinks. How do I count carbohydrates? There are two ways to count carbohydrates in food. You can use either of the methods or a combination of both. Reading "Nutrition Facts" on packaged food  The "Nutrition Facts" list is included on the labels of almost all packaged foods and beverages in the [place] It includes:  The serving size.  Information about nutrients in each serving, including the grams (g) of carbohydrate per serving. To use the "Nutrition Facts":  Decide how many servings you will have.  Multiply the number of servings by the number of carbohydrates per serving.  The resulting number is the total amount of carbohydrates that you will be having. Learning standard serving sizes of other foods  When you eat foods containing carbohydrates that are not packaged or do not include "Nutrition Facts" on the label, you need to measure the servings in order to count the amount of carbohydrates:  Measure the foods that you will eat with a food scale or measuring  cup, if needed.  Decide how many standard-size servings you will eat.  Multiply the number of servings by 15. Most carbohydrate-rich foods have about 15 g of carbohydrates per serving.  For example, if you eat 8 oz (170 g) of strawberries, you will have eaten 2 servings and 30 g of carbohydrates (2 servings x 15 g = 30 g).  For foods that have more than one food mixed, such as soups and casseroles, you must count the carbohydrates in each food that is included. The following list contains standard serving sizes of common carbohydrate-rich foods. Each of these servings has about 15 g of carbohydrates:   hamburger bun or  English muffin.   oz (15 mL) syrup.   oz (14 g) jelly.  1 slice of bread.  1 six-inch tortilla.  3 oz (85 g) cooked rice or pasta.  4 oz (113 g) cooked dried beans.  4 oz (113 g) starchy vegetable, such as peas, corn, or potatoes.  4 oz (113 g) hot cereal.  4 oz (113 g) mashed potatoes or  of a large baked potato.  4 oz (113 g) canned or frozen fruit.  4 oz (120 mL) fruit juice.  4-6 crackers.  6 chicken nuggets.  6 oz (170 g) unsweetened dry cereal.  6 oz (170 g) plain fat-free yogurt or yogurt sweetened with artificial sweeteners.  8 oz (240 mL) milk.  8 oz (170 g) fresh fruit or one small piece of fruit.  24 oz (680 g) popped popcorn. Example of carbohydrate counting Sample meal  3 oz (85 g) chicken breast.  6 oz (  170 g) brown rice.  4 oz (113 g) corn.  8 oz (240 mL) milk.  8 oz (170 g) strawberries with sugar-free whipped topping. Carbohydrate calculation 1. Identify the foods that contain carbohydrates:  Rice.  Corn.  Milk.  Strawberries. 2. Calculate how many servings you have of each food:  2 servings rice.  1 serving corn.  1 serving milk.  1 serving strawberries. 3. Multiply each number of servings by 15 g:  2 servings rice x 15 g = 30 g.  1 serving corn x 15 g = 15 g.  1 serving milk x 15 g = 15  g.  1 serving strawberries x 15 g = 15 g. 4. Add together all of the amounts to find the total grams of carbohydrates eaten:  30 g + 15 g + 15 g + 15 g = 75 g of carbohydrates total. This information is not intended to replace advice given to you by your health care provider. Make sure you discuss any questions you have with your health care provider. Document Released: 04/12/2005 Document Revised: 10/31/2015 Document Reviewed: 09/24/2015 Elsevier Interactive Patient Education  2017 Elsevier Inc.  

## 2016-06-16 NOTE — Progress Notes (Signed)
Patient: Nicole Lynn Female    DOB: Jan 22, 1941   76 y.o.   MRN: [id number] Visit Date: 06/16/2016  Today's Provider: [name], PA-C   Chief Complaint  Patient presents with  . Diabetes   Subjective:    HPI  Diabetes Mellitus Type II, Follow-up:   Lab Results  Component Value Date   HGBA1C 6.7 04/12/2016   HGBA1C 6.3 01/05/2016   HGBA1C 6.6 09/08/2015    Last seen for diabetes 2 months ago.  Management since then includes started on Metformin 500 mg qd. She reports excellent compliance with treatment. She is not having side effects.  Current symptoms include none and have been stable. Home blood sugar records: fasting range: 130's  Episodes of hypoglycemia? no   Current Insulin Regimen:  Most Recent Eye Exam: UTD Weight trend: stable Prior visit with dietician: no Current diet: in general, a "healthy" diet   Current exercise: housecleaning and streching  Pertinent Labs:    Component Value Date/Time   CHOL 248 (H) 05/27/2015 0828   TRIG 225 (H) 05/27/2015 0828   HDL 54 05/27/2015 0828   LDLCALC 149 (H) 05/27/2015 0828   CREATININE 0.65 05/27/2015 0828   CREATININE 0.54 (L) 06/21/2013 0809    Wt Readings from Last 3 Encounters:  06/16/16 191 lb 9.6 oz (86.9 kg)  04/12/16 191 lb (86.6 kg)  01/05/16 190 lb 12.8 oz (86.5 kg)    ------------------------------------------------------------------------   Hypertension, follow-up:  BP Readings from Last 3 Encounters:  06/16/16 126/72  05/14/16 128/70  04/12/16 138/72    She was last seen for hypertension 5 months ago.  BP at that visit was 120/70 . Management changes since that visit include no changes. She reports excellent compliance with treatment. She is not having side effects.  She is exercising. She is adherent to low salt diet.   Outside blood pressures are not being checked. She is experiencing none.  Patient denies chest pain and lower extremity edema.     Cardiovascular risk factors include advanced age (older than 20 for men, 67 for women), diabetes mellitus, hypertension and obesity (BMI >= 30 kg/m2).  Use of agents associated with hypertension: none.     Weight trend: stable Wt Readings from Last 3 Encounters:  06/16/16 191 lb 9.6 oz (86.9 kg)  04/12/16 191 lb (86.6 kg)  01/05/16 190 lb 12.8 oz (86.5 kg)    Current diet: in general, a "healthy" diet    Arm/hand numbness is very temporary. Only occurs positionally when she lies on the couch. Goes away when she gets up and moves her arms/hands. No grip weakness.  ------------------------------------------------------------------------     Allergies  Allergen Reactions  . Latex      Current Outpatient Prescriptions:  .  atenolol-chlorthalidone (TENORETIC) 50-25 MG tablet, Take 1 tablet by mouth daily., Disp: 90 tablet, Rfl: 3 .  fish oil-omega-3 fatty acids 1000 MG capsule, Take 2 g by mouth daily., Disp: , Rfl:  .  glucose blood (ONETOUCH VERIO) test strip, Use to check blood sugar once daily, Disp: 100 each, Rfl: 5 .  latanoprost (XALATAN) 0.005 % ophthalmic solution, , Disp: , Rfl: 0 .  metFORMIN (GLUCOPHAGE) 500 MG tablet, take 1 tablet by mouth once daily WITH BREAKFAST, Disp: 30 tablet, Rfl: 5 .  Multiple Vitamins-Minerals (CENTRUM SILVER PO), Take by mouth once., Disp: , Rfl:  .  ONETOUCH DELICA LANCETS FINE MISC, To check blood sugars once a day.  DX: E11.9, Disp:  100 each, Rfl: 1 .  zolpidem (AMBIEN) 10 MG tablet, Take 1 tablet (10 mg total) by mouth at bedtime. (Patient taking differently: Take 10 mg by mouth at bedtime as needed. ), Disp: 30 tablet, Rfl: 5  Review of Systems  Constitutional: Negative.   Respiratory: Negative.   Cardiovascular: Negative.   Gastrointestinal: Negative.   Endocrine: Negative.   Neurological: Positive for numbness (arms and hand).    Social History  Substance Use Topics  . Smoking status: Never Smoker  . Smokeless tobacco: Never  Used  . Alcohol use 1.8 oz/week    3 Glasses of wine per week   Objective:   BP  126/72 (BP Location: Left Arm)     Pulse  72     Temp  97.9 F (36.6 C) (Oral)     Ht  5\' 1"  (1.549 m)     Wt  191 lb 9.6 oz (86.9 kg)      BMI  36.20 kg/m      Physical Exam  Constitutional: She is oriented to person, place, and time. She appears well-developed and well-nourished. No distress.  HENT:  Head: Normocephalic and atraumatic.  Right Ear: Hearing, tympanic membrane, external ear and ear canal normal.  Left Ear: Hearing, tympanic membrane, external ear and ear canal normal.  Nose: Nose normal.  Mouth/Throat: Uvula is midline, oropharynx is clear and moist and mucous membranes are normal. No oropharyngeal exudate, posterior oropharyngeal edema or posterior oropharyngeal erythema.  Eyes: Conjunctivae and EOM are normal. Pupils are equal, round, and reactive to light. Right eye exhibits no discharge. Left eye exhibits no discharge. No scleral icterus.  Neck: Normal range of motion. Neck supple. No JVD present. Carotid bruit is not present. No tracheal deviation present. No thyromegaly present.  Cardiovascular: Normal rate, regular rhythm, normal heart sounds and intact distal pulses.  Exam reveals no gallop and no friction rub.   No murmur heard. Pulmonary/Chest: Effort normal and breath sounds normal. No respiratory distress. She has no wheezes. She has no rales. She exhibits no tenderness. Right breast exhibits no inverted nipple, no mass, no nipple discharge, no skin change and no tenderness. Left breast exhibits no inverted nipple, no mass, no nipple discharge, no skin change and no tenderness. Breasts are symmetrical.  Abdominal: Soft. Bowel sounds are normal. She exhibits no distension and no mass. There is no tenderness. There is no rebound and no guarding. Hernia confirmed negative in the right inguinal area and confirmed negative in the left inguinal area.  Genitourinary: Rectum  normal, vagina normal and uterus normal. No breast swelling, tenderness, discharge or bleeding. Pelvic exam was performed with patient supine. There is no rash, tenderness, lesion or injury on the right labia. There is no rash, tenderness, lesion or injury on the left labia. Cervix exhibits no motion tenderness, no discharge and no friability. Right adnexum displays no mass, no tenderness and no fullness. Left adnexum displays no mass, no tenderness and no fullness. No erythema, tenderness or bleeding in the vagina. No signs of injury around the vagina. No vaginal discharge found.  Musculoskeletal: Normal range of motion. She exhibits no edema or tenderness.  Lymphadenopathy:    She has no cervical adenopathy.       Right: No inguinal adenopathy present.       Left: No inguinal adenopathy present.  Neurological: She is alert and oriented to person, place, and time. She has normal reflexes. No cranial nerve deficit. Coordination normal.  Skin: Skin is warm and  dry. No rash noted. She is not diaphoretic.  Psychiatric: She has a normal mood and affect. Her behavior is normal. Judgment and thought content normal.  Vitals reviewed.  Diabetic Foot Form - Detailed   Diabetic Foot Exam - detailed Diabetic Foot exam was performed with the following findings:  Yes 06/16/2016  2:44 PM  Is there a history of foot ulcer?:  No Can the patient see the bottom of their feet?:  Yes Are the shoes appropriate in style and fit?:  No Is there swelling or and abnormal foot shape?:  No Are the toenails long?:  No Are the toenails thick?:  Yes Do you have pain in calf while walking?:  No Is there a claw toe deformity?:  No Is there elevated skin temparature?:  No Is there limited skin dorsiflexion?:  No Is there foot or ankle muscle weakness?:  No Are the toenails ingrown?:  No Normal Range of Motion:  Yes Pulse Foot Exam completed.:  Yes  Right posterior Tibialias:  Present Left posterior Tibialias:  Present    Right Dorsalis Pedis:  Present Left Dorsalis Pedis:  Present  Sensory Foot Exam Completed.:  Yes Swelling:  No Semmes-Weinstein Monofilament Test R Foot Test Control:  Neg L Foot Test Control:  Neg  R Site 1-Great Toe:  Neg L Site 1-Great Toe:  Neg  R Site 4:  Neg L Site 4:  Neg  R Site 5:  Neg L Site 5:  Neg          Assessment & Plan:     1. Type 2 diabetes mellitus without complication, without long-term current use of insulin (HCC) UA microalbumin 50. Will check other labs as below. I will f/u pending lab results.  - POCT UA - Microalbumin - Comprehensive Metabolic Panel (CMET) - HgB A1c  2. Essential hypertension Stable. Continue tenoretic 50-25mg . Patient refuses any ACE or ARB for microalbumin. Will check labs as below and f/u pending results. - CBC w/Diff/Platelet - Comprehensive Metabolic Panel (CMET)  3. Subclinical hypothyroidism Not symptomatic. Will check labs as below and f/u pending results. - TSH  4. Hypercholesteremia Stable. Patient refuses statin at this time. Will check labs as below and f/u pending results. - Comprehensive Metabolic Panel (CMET) - Lipid Profile  5. Breast cancer screening Patient refuses. States she would not do chemo or have surgery if anything were found. Also refuses colonoscopy.       Margaretann LovelessJennifer M Dorsey Authement, PA-C  [facility]

## 2016-06-16 NOTE — Progress Notes (Signed)
Subjective:   Nicole Lynn is a 76 y.o. female who presents for Medicare Annual (Subsequent) preventive examination.  Review of Systems:  N/A  Cardiac Risk Factors include: advanced age (>59men, >83 women);hypertension;diabetes mellitus;obesity (BMI >30kg/m2)     Objective:     Vitals: BP 126/72 (BP Location: Left Arm)   Pulse 72   Temp 97.9 F (36.6 C) (Oral)   Ht 5\' 1"  (1.549 m)   Wt 191 lb 9.6 oz (86.9 kg)   BMI 36.20 kg/m   Body mass index is 36.2 kg/m.   Tobacco History  Smoking Status  . Never Smoker  Smokeless Tobacco  . Never Used     Counseling given: Not Answered   Past Medical History:  Diagnosis Date  . Anxiety   . Chest pain, unspecified   . HTN (hypertension)   . Menopausal state   . Overweight(278.02)    Past Surgical History:  Procedure Laterality Date  . ABDOMINAL HYSTERECTOMY  2002   TAH/BSO  . APPENDECTOMY  1959  . BILATERAL SALPINGOOPHORECTOMY     Family History  Problem Relation Age of Onset  . Stroke Mother   . Prostate cancer Father   . Cancer Other   . Coronary artery disease Other   . Stroke Other    History  Sexual Activity  . Sexual activity: No    Comment: BTL/HYST    Outpatient Encounter Prescriptions as of 06/16/2016  Medication Sig  . atenolol-chlorthalidone (TENORETIC) 50-25 MG tablet Take 1 tablet by mouth daily.  . fish oil-omega-3 fatty acids 1000 MG capsule Take 2 g by mouth daily.  Marland Kitchen glucose blood (ONETOUCH VERIO) test strip Use to check blood sugar once daily  . latanoprost (XALATAN) 0.005 % ophthalmic solution   . metFORMIN (GLUCOPHAGE) 500 MG tablet take 1 tablet by mouth once daily WITH BREAKFAST  . Multiple Vitamins-Minerals (CENTRUM SILVER PO) Take by mouth once.  . nitrofurantoin, macrocrystal-monohydrate, (MACROBID) 100 MG capsule take 1 capsule by mouth once daily if needed  . ONETOUCH DELICA LANCETS FINE MISC To check blood sugars once a day.  DX: E11.9  . phenazopyridine (PYRIDIUM) 200  MG tablet Take 1 tablet (200 mg total) by mouth 3 (three) times daily as needed for pain.  Marland Kitchen triamcinolone cream (KENALOG) 0.1 % Apply 1 application topically 2 (two) times daily. (Patient taking differently: Apply 1 application topically 2 (two) times daily. )  . zolpidem (AMBIEN) 10 MG tablet Take 1 tablet (10 mg total) by mouth at bedtime. (Patient taking differently: Take 10 mg by mouth at bedtime as needed. )   No facility-administered encounter medications on file as of 06/16/2016.     Activities of Daily Living In your present state of health, do you have any difficulty performing the following activities: 06/16/2016  Hearing? N  Vision? N  Difficulty concentrating or making decisions? N  Walking or climbing stairs? N  Dressing or bathing? N  Doing errands, shopping? N  Preparing Food and eating ? N  Using the Toilet? N  In the past six months, have you accidently leaked urine? N  Do you have problems with loss of bowel control? N  Managing your Medications? N  Managing your Finances? N  Housekeeping or managing your Housekeeping? N  Some recent data might be hidden    Patient Care Team: [name], PA-C as PCP - General (Family Medicine) Silverio Lay, MD as Consulting Physician (Obstetrics and Gynecology) Lennon Alstrom, MD as Referring Physician (Dermatology)  Assessment:     Exercise Activities and Dietary recommendations Current Exercise Habits: Home exercise routine (yardwork), Type of exercise: stretching, Time (Minutes): 10, Frequency (Times/Week): 7, Weekly Exercise (Minutes/Week): 70, Intensity: Mild  Goals    . Weight (lb) < 180 lb (81.6 kg)          Starting in 2018, I will work to lose 10 lbs this year.       Fall Risk Fall Risk  06/16/2016 05/16/2015  Falls in the past year? No No   Depression Screen PHQ 2/9 Scores 06/16/2016 05/16/2015  PHQ - 2 Score 0 0     Cognitive Function     6CIT Screen 06/16/2016  What Year? 0 points  What month?  0 points  What time? 0 points  Count back from 20 0 points  Months in reverse 0 points  Repeat phrase 6 points  Total Score 6    Immunization History  Administered Date(s) Administered  . Influenza, High Dose Seasonal PF 01/05/2016  . Influenza,inj,Quad PF,36+ Mos 05/16/2015  . Pneumococcal Conjugate-13 05/16/2015  . Pneumococcal Polysaccharide-23 01/08/2013  . Zoster 10/25/2012   Screening Tests Health Maintenance  Topic Date Due  . FOOT EXAM  10/04/1952  . URINE MICROALBUMIN  10/04/1952  . TETANUS/TDAP  04/26/2017 (Originally 10/04/1961)  . HEMOGLOBIN A1C  10/11/2016  . OPHTHALMOLOGY EXAM  04/26/2017  . MAMMOGRAM  08/24/2017  . COLONOSCOPY  05/15/2025  . INFLUENZA VACCINE  Completed  . DEXA SCAN  Completed  . PNA vac Low Risk Adult  Completed      Plan:  I have personally reviewed and addressed the Medicare Annual Wellness questionnaire and have noted the following in the patient's chart:  A. Medical and social history B. Use of alcohol, tobacco or illicit drugs  C. Current medications and supplements D. Functional ability and status E.  Nutritional status F.  Physical activity G. Advance directives H. List of other physicians I.  Hospitalizations, surgeries, and ER visits in previous 12 months J.  Vitals K. Screenings such as hearing and vision if needed, cognitive and depression L. Referrals and appointments - none  In addition, I have reviewed and discussed with patient certain preventive protocols, quality metrics, and best practice recommendations. A written personalized care plan for preventive services as well as general preventive health recommendations were provided to patient.  See attached scanned questionnaire for additional information.   Signed,  Hyacinth MeekerMckenzie Saqib Cazarez, LPN Nurse Health Advisor   MD Recommendations: Follow up on Urine microalbumin and diabetic foot exam.  I have reviewed the documentation and information obtained by Hyacinth MeekerMcKenzie  Corri Delapaz, LPN in the above chart and agree as above. I was available for consultation if any questions or issues arose.  Joycelyn ManJennifer Burnette, PA-C

## 2016-06-16 NOTE — Patient Instructions (Signed)
Health Maintenance, Female Introduction Adopting a healthy lifestyle and getting preventive care can go a long way to promote health and wellness. Talk with your health care provider about what schedule of regular examinations is right for you. This is a good chance for you to check in with your provider about disease prevention and staying healthy. In between checkups, there are plenty of things you can do on your own. Experts have done a lot of research about which lifestyle changes and preventive measures are most likely to keep you healthy. Ask your health care provider for more information. Weight and diet Eat a healthy diet  Be sure to include plenty of vegetables, fruits, low-fat dairy products, and lean protein.  Do not eat a lot of foods high in solid fats, added sugars, or salt.  Get regular exercise. This is one of the most important things you can do for your health.  Most adults should exercise for at least 150 minutes each week. The exercise should increase your heart rate and make you sweat (moderate-intensity exercise).  Most adults should also do strengthening exercises at least twice a week. This is in addition to the moderate-intensity exercise. Maintain a healthy weight  Body mass index (BMI) is a measurement that can be used to identify possible weight problems. It estimates body fat based on height and weight. Your health care provider can help determine your BMI and help you achieve or maintain a healthy weight.  For females 4 years of age and older:  A BMI below 18.5 is considered underweight.  A BMI of 18.5 to 24.9 is normal.  A BMI of 25 to 29.9 is considered overweight.  A BMI of 30 and above is considered obese. Watch levels of cholesterol and blood lipids  You should start having your blood tested for lipids and cholesterol at 74 years of age, then have this test every 5 years.  You may need to have your cholesterol levels checked more often  if:  Your lipid or cholesterol levels are high.  You are older than 74 years of age.  You are at high risk for heart disease. Cancer screening Lung Cancer  Lung cancer screening is recommended for adults 12-31 years old who are at high risk for lung cancer because of a history of smoking.  A yearly low-dose CT scan of the lungs is recommended for people who:  Currently smoke.  Have quit within the past 15 years.  Have at least a 30-pack-year history of smoking. A pack year is smoking an average of one pack of cigarettes a day for 1 year.  Yearly screening should continue until it has been 15 years since you quit.  Yearly screening should stop if you develop a health problem that would prevent you from having lung cancer treatment. Breast Cancer  Practice breast self-awareness. This means understanding how your breasts normally appear and feel.  It also means doing regular breast self-exams. Let your health care provider know about any changes, no matter how small.  If you are in your 20s or 30s, you should have a clinical breast exam (CBE) by a health care provider every 1-3 years as part of a regular health exam.  If you are 74 or older, have a CBE every year. Also consider having a breast X-ray (mammogram) every year.  If you have a family history of breast cancer, talk to your health care provider about genetic screening.  If you are at high risk for breast cancer,  talk to your health care provider about having an MRI and a mammogram every year.  Breast cancer gene (BRCA) assessment is recommended for women who have family members with BRCA-related cancers. BRCA-related cancers include:  Breast.  Ovarian.  Tubal.  Peritoneal cancers.  Results of the assessment will determine the need for genetic counseling and BRCA1 and BRCA2 testing. Colorectal Cancer  This type of cancer can be detected and often prevented.  Routine colorectal cancer screening usually begins  at 74 years of age and continues through 75 years of age.  Your health care provider may recommend screening at an earlier age if you have risk factors for colon cancer.  Your health care provider may also recommend using home test kits to check for hidden blood in the stool.  A small camera at the end of a tube can be used to examine your colon directly (sigmoidoscopy or colonoscopy). This is done to check for the earliest forms of colorectal cancer.  Routine screening usually begins at age 50.  Direct examination of the colon should be repeated every 5-10 years through 75 years of age. However, you may need to be screened more often if early forms of precancerous polyps or small growths are found. Skin Cancer  Check your skin from head to toe regularly.  Tell your health care provider about any new moles or changes in moles, especially if there is a change in a mole's shape or color.  Also tell your health care provider if you have a mole that is larger than the size of a pencil eraser.  Always use sunscreen. Apply sunscreen liberally and repeatedly throughout the day.  Protect yourself by wearing long sleeves, pants, a wide-brimmed hat, and sunglasses whenever you are outside. Heart disease, diabetes, and high blood pressure  High blood pressure causes heart disease and increases the risk of stroke. High blood pressure is more likely to develop in:  People who have blood pressure in the high end of the normal range (130-139/85-89 mm Hg).  People who are overweight or obese.  People who are African American.  If you are 18-39 years of age, have your blood pressure checked every 3-5 years. If you are 40 years of age or older, have your blood pressure checked every year. You should have your blood pressure measured twice-once when you are at a hospital or clinic, and once when you are not at a hospital or clinic. Record the average of the two measurements. To check your blood pressure  when you are not at a hospital or clinic, you can use:  An automated blood pressure machine at a pharmacy.  A home blood pressure monitor.  If you are between 55 years and 79 years old, ask your health care provider if you should take aspirin to prevent strokes.  Have regular diabetes screenings. This involves taking a blood sample to check your fasting blood sugar level.  If you are at a normal weight and have a low risk for diabetes, have this test once every three years after 74 years of age.  If you are overweight and have a high risk for diabetes, consider being tested at a younger age or more often. Preventing infection Hepatitis B  If you have a higher risk for hepatitis B, you should be screened for this virus. You are considered at high risk for hepatitis B if:  You were born in a country where hepatitis B is common. Ask your health care provider which countries are   considered high risk.  Your parents were born in a high-risk country, and you have not been immunized against hepatitis B (hepatitis B vaccine).  You have HIV or AIDS.  You use needles to inject street drugs.  You live with someone who has hepatitis B.  You have had sex with someone who has hepatitis B.  You get hemodialysis treatment.  You take certain medicines for conditions, including cancer, organ transplantation, and autoimmune conditions. Hepatitis C  Blood testing is recommended for:  Everyone born from 1945 through 1965.  Anyone with known risk factors for hepatitis C. Osteoporosis and menopause  Osteoporosis is a disease in which the bones lose minerals and strength with aging. This can result in serious bone fractures. Your risk for osteoporosis can be identified using a bone density scan.  If you are 65 years of age or older, or if you are at risk for osteoporosis and fractures, ask your health care provider if you should be screened.  Ask your health care provider whether you should take  a calcium or vitamin D supplement to lower your risk for osteoporosis.  Menopause may have certain physical symptoms and risks.  Hormone replacement therapy may reduce some of these symptoms and risks. Talk to your health care provider about whether hormone replacement therapy is right for you. Follow these instructions at home:  Schedule regular health, dental, and eye exams.  Stay current with your immunizations.  Do not use any tobacco products including cigarettes, chewing tobacco, or electronic cigarettes.  If you are pregnant, do not drink alcohol.  If you are breastfeeding, limit how much and how often you drink alcohol.  Limit alcohol intake to no more than 1 drink per day for nonpregnant women. One drink equals 12 ounces of beer, 5 ounces of wine, or 1 ounces of hard liquor.  Do not use street drugs.  Do not share needles.  Ask your health care provider for help if you need support or information about quitting drugs.  Tell your health care provider if you often feel depressed.  Tell your health care provider if you have ever been abused or do not feel safe at home. This information is not intended to replace advice given to you by your health care provider. Make sure you discuss any questions you have with your health care provider. Document Released: 10/26/2010 Document Revised: 09/18/2015 Document Reviewed: 01/14/2015  2017 Elsevier  

## 2016-08-10 ENCOUNTER — Telehealth: Payer: Self-pay | Admitting: Physician Assistant

## 2016-08-10 DIAGNOSIS — I10 Essential (primary) hypertension: Secondary | ICD-10-CM

## 2016-08-10 NOTE — Telephone Encounter (Signed)
Please notify patient. This happened the last 2 years as well. Last year patient had refused change and still continued medication even without coverage. See if patient wants to continue that again. We could always separate the two medications that way she is on the same medication but just taking 2 pills instead of one.

## 2016-08-10 NOTE — Telephone Encounter (Signed)
[employer] called the office to notify you that her Atenolol has been denied as it is a non formulary medication.  They will be faxing you the deniel in writing.   Thanks ED

## 2016-08-10 NOTE — Telephone Encounter (Signed)
[employer] faxed approval for  atenolol-chlorthalidone (TENORETIC) 50-25 MG tablet  And wants to know if we recd the fax and would we faxing back the approval for the RX.  Thanks [employer]

## 2016-08-11 MED ORDER — CHLORTHALIDONE 25 MG PO TABS: 25.0000 mg | ORAL_TABLET | Freq: Every day | ORAL | 3 refills | Status: DC

## 2016-08-11 MED ORDER — ATENOLOL 50 MG PO TABS: 50.0000 mg | ORAL_TABLET | Freq: Every day | ORAL | 3 refills | Status: DC

## 2016-08-11 NOTE — Telephone Encounter (Signed)
Medications have been sent to [facility] [address]  Same doses are prescribed and she can still take together.

## 2016-08-11 NOTE — Telephone Encounter (Signed)
Patient advised as directed below. She is willing to separate the two medications.  Thanks,  -[place]

## 2016-08-13 ENCOUNTER — Encounter: Payer: Self-pay | Admitting: Physician Assistant

## 2016-08-13 ENCOUNTER — Ambulatory Visit (INDEPENDENT_AMBULATORY_CARE_PROVIDER_SITE_OTHER): Payer: Medicare Other | Admitting: Physician Assistant

## 2016-08-13 VITALS — BP 120/60 | HR 61 | Temp 97.3°F | Resp 16 | Wt 190.6 lb

## 2016-08-13 DIAGNOSIS — J0140 Acute pansinusitis, unspecified: Secondary | ICD-10-CM | POA: Diagnosis not present

## 2016-08-13 MED ORDER — AMOXICILLIN-POT CLAVULANATE 875-125 MG PO TABS: 1.0000 | ORAL_TABLET | Freq: Two times a day (BID) | ORAL | 0 refills | Status: DC

## 2016-08-13 NOTE — Progress Notes (Signed)
Patient: Nicole Lynn Female    DOB: 06/20/1940   76 y.o.   MRN: [id number] Visit Date: 08/13/2016  Today's Provider: [name], PA-C   Chief Complaint  Patient presents with  . URI   Subjective:    URI   This is a new problem. The current episode started in the past 7 days (she just came back from [facility] Thursday and symptoms started Friday.). The problem has been gradually worsening. There has been no fever. Associated symptoms include congestion, coughing, headaches, rhinorrhea, sinus pain and a sore throat. Pertinent negatives include no abdominal pain, chest pain, diarrhea, dysuria, ear pain, nausea, neck pain, plugged ear sensation, rash, vomiting or wheezing. She has tried decongestant and increased fluids (Alka-Seltzer plus) for the symptoms. The treatment provided no relief.      Allergies  Allergen Reactions  . Latex      Current Outpatient Prescriptions:  .  atenolol (TENORMIN) 50 MG tablet, Take 1 tablet (50 mg total) by mouth daily., Disp: 90 tablet, Rfl: 3 .  chlorthalidone (HYGROTON) 25 MG tablet, Take 1 tablet (25 mg total) by mouth daily., Disp: 90 tablet, Rfl: 3 .  fish oil-omega-3 fatty acids 1000 MG capsule, Take 2 g by mouth daily., Disp: , Rfl:  .  glucose blood (ONETOUCH VERIO) test strip, Use to check blood sugar once daily, Disp: 100 each, Rfl: 5 .  latanoprost (XALATAN) 0.005 % ophthalmic solution, , Disp: , Rfl: 0 .  metFORMIN (GLUCOPHAGE) 500 MG tablet, take 1 tablet by mouth once daily WITH BREAKFAST, Disp: 30 tablet, Rfl: 5 .  Multiple Vitamins-Minerals (CENTRUM SILVER PO), Take by mouth once., Disp: , Rfl:  .  ONETOUCH DELICA LANCETS FINE MISC, To check blood sugars once a day.  DX: E11.9, Disp: 100 each, Rfl: 1 .  zolpidem (AMBIEN) 10 MG tablet, Take 1 tablet (10 mg total) by mouth at bedtime. (Patient taking differently: Take 10 mg by mouth at bedtime as needed. ), Disp: 30 tablet, Rfl: 5  Review of Systems    Constitutional: Positive for chills and fatigue. Negative for fever.  HENT: Positive for congestion, postnasal drip, rhinorrhea, sinus pain, sinus pressure, sore throat and voice change. Negative for ear pain.   Eyes: Positive for discharge.  Respiratory: Positive for cough. Negative for chest tightness, shortness of breath and wheezing.   Cardiovascular: Negative for chest pain, palpitations and leg swelling.  Gastrointestinal: Negative for abdominal pain, diarrhea, nausea and vomiting.  Genitourinary: Negative for dysuria.  Musculoskeletal: Negative for neck pain.  Skin: Negative for rash.  Neurological: Positive for headaches.    Social History  Substance Use Topics  . Smoking status: Never Smoker  . Smokeless tobacco: Never Used  . Alcohol use 1.8 oz/week    3 Glasses of wine per week   Objective:   BP 120/60 (BP Location: Left Arm, Patient Position: Sitting, Cuff Size: Normal)   Pulse 61   Temp 97.3 F (36.3 C) (Oral)   Resp 16   Wt 190 lb 9.6 oz (86.5 kg)   SpO2 96%   BMI 36.01 kg/m     Physical Exam  Constitutional: She appears well-developed and well-nourished. No distress.  HENT:  Head: Normocephalic and atraumatic.  Right Ear: Hearing, tympanic membrane, external ear and ear canal normal.  Left Ear: Hearing, tympanic membrane, external ear and ear canal normal.  Nose: Mucosal edema and rhinorrhea present. Right sinus exhibits maxillary sinus tenderness and frontal sinus tenderness. Left sinus  exhibits maxillary sinus tenderness and frontal sinus tenderness.  Mouth/Throat: Uvula is midline, oropharynx is clear and moist and mucous membranes are normal. No oropharyngeal exudate.  Neck: Normal range of motion. Neck supple. No tracheal deviation present. No thyromegaly present.  Cardiovascular: Normal rate, regular rhythm and normal heart sounds.  Exam reveals no gallop and no friction rub.   No murmur heard. Pulmonary/Chest: Effort normal and breath sounds normal.  No stridor. No respiratory distress. She has no wheezes. She has no rales.  Lymphadenopathy:    She has no cervical adenopathy.  Skin: She is not diaphoretic.  Vitals reviewed.      Assessment & Plan:     1. Acute pansinusitis, recurrence not specified Worsening symptoms that have not responded to OTC medications. Will give augmentin as below. Continue allergy medications. Stay well hydrated and get plenty of rest. Call if no symptom improvement or if symptoms worsen. - amoxicillin-clavulanate (AUGMENTIN) 875-125 MG tablet; Take 1 tablet by mouth 2 (two) times daily.  Dispense: 20 tablet; Refill: 0       [name], PA-C  [facility]

## 2016-08-13 NOTE — Patient Instructions (Signed)

## 2016-09-08 DIAGNOSIS — H40153 Residual stage of open-angle glaucoma, bilateral: Secondary | ICD-10-CM | POA: Diagnosis not present

## 2016-09-08 DIAGNOSIS — H524 Presbyopia: Secondary | ICD-10-CM | POA: Diagnosis not present

## 2016-09-23 ENCOUNTER — Other Ambulatory Visit: Payer: Self-pay | Admitting: Physician Assistant

## 2016-09-23 DIAGNOSIS — E119 Type 2 diabetes mellitus without complications: Secondary | ICD-10-CM

## 2016-09-23 MED ORDER — GLUCOSE BLOOD VI STRP: ORAL_STRIP | 5 refills | Status: DC

## 2016-09-23 NOTE — Telephone Encounter (Signed)
Pt calling stating she called her pharmacy about her refill on glucose blood (ONETOUCH VERIO) test strip  The pharmacy states she needs authorization to get a refill. Pt states the pharmacy say's they have faxed over a form stating it needs authorization and haven't heard back from us. Pt states if we have any questions to please contact her pharmacy which is [facility]  [address]. Thanks CC

## 2016-09-23 NOTE — Telephone Encounter (Signed)
This form was faxed on Tuesday or Wednesday of this week.

## 2016-10-25 DIAGNOSIS — Z01419 Encounter for gynecological examination (general) (routine) without abnormal findings: Secondary | ICD-10-CM | POA: Diagnosis not present

## 2016-10-25 DIAGNOSIS — Z1231 Encounter for screening mammogram for malignant neoplasm of breast: Secondary | ICD-10-CM | POA: Diagnosis not present

## 2016-10-25 DIAGNOSIS — B009 Herpesviral infection, unspecified: Secondary | ICD-10-CM | POA: Diagnosis not present

## 2016-10-25 DIAGNOSIS — Z6834 Body mass index (BMI) 34.0-34.9, adult: Secondary | ICD-10-CM | POA: Diagnosis not present

## 2016-11-01 DIAGNOSIS — L814 Other melanin hyperpigmentation: Secondary | ICD-10-CM | POA: Diagnosis not present

## 2016-11-01 DIAGNOSIS — L820 Inflamed seborrheic keratosis: Secondary | ICD-10-CM | POA: Diagnosis not present

## 2016-11-01 DIAGNOSIS — L821 Other seborrheic keratosis: Secondary | ICD-10-CM | POA: Diagnosis not present

## 2016-11-11 DIAGNOSIS — E119 Type 2 diabetes mellitus without complications: Secondary | ICD-10-CM | POA: Diagnosis not present

## 2016-11-19 ENCOUNTER — Other Ambulatory Visit: Payer: Self-pay | Admitting: Physician Assistant

## 2016-11-19 DIAGNOSIS — E119 Type 2 diabetes mellitus without complications: Secondary | ICD-10-CM

## 2016-11-29 DIAGNOSIS — E7800 Pure hypercholesterolemia, unspecified: Secondary | ICD-10-CM | POA: Diagnosis not present

## 2016-11-29 DIAGNOSIS — I10 Essential (primary) hypertension: Secondary | ICD-10-CM | POA: Diagnosis not present

## 2016-11-29 DIAGNOSIS — E039 Hypothyroidism, unspecified: Secondary | ICD-10-CM | POA: Diagnosis not present

## 2016-11-29 DIAGNOSIS — E119 Type 2 diabetes mellitus without complications: Secondary | ICD-10-CM | POA: Diagnosis not present

## 2016-11-30 ENCOUNTER — Telehealth: Payer: Self-pay

## 2016-11-30 LAB — CBC WITH DIFFERENTIAL/PLATELET
Basophils Absolute: 0 10*3/uL (ref 0.0–0.2)
Basos: 1 %
EOS (ABSOLUTE): 0.1 10*3/uL (ref 0.0–0.4)
Eos: 2 %
Hematocrit: 44.9 % (ref 34.0–46.6)
Hemoglobin: 15 g/dL (ref 11.1–15.9)
Immature Grans (Abs): 0 10*3/uL (ref 0.0–0.1)
Immature Granulocytes: 0 %
Lymphocytes Absolute: 2.1 10*3/uL (ref 0.7–3.1)
Lymphs: 33 %
MCH: 32.2 pg (ref 26.6–33.0)
MCHC: 33.4 g/dL (ref 31.5–35.7)
MCV: 96 fL (ref 79–97)
Monocytes Absolute: 0.4 10*3/uL (ref 0.1–0.9)
Monocytes: 6 %
Neutrophils Absolute: 3.8 10*3/uL (ref 1.4–7.0)
Neutrophils: 58 %
Platelets: 235 10*3/uL (ref 150–379)
RBC: 4.66 x10E6/uL (ref 3.77–5.28)
RDW: 13.8 % (ref 12.3–15.4)
WBC: 6.4 10*3/uL (ref 3.4–10.8)

## 2016-11-30 LAB — COMPREHENSIVE METABOLIC PANEL
ALT: 19 IU/L (ref 0–32)
AST: 15 IU/L (ref 0–40)
Albumin/Globulin Ratio: 1.6 (ref 1.2–2.2)
Albumin: 4 g/dL (ref 3.5–4.8)
Alkaline Phosphatase: 61 IU/L (ref 39–117)
BUN/Creatinine Ratio: 22 (ref 12–28)
BUN: 15 mg/dL (ref 8–27)
Bilirubin Total: 0.4 mg/dL (ref 0.0–1.2)
CO2: 24 mmol/L (ref 20–29)
Calcium: 9.7 mg/dL (ref 8.7–10.3)
Chloride: 102 mmol/L (ref 96–106)
Creatinine, Ser: 0.68 mg/dL (ref 0.57–1.00)
GFR calc Af Amer: 100 mL/min/{1.73_m2} (ref >59)
GFR calc non Af Amer: 86 mL/min/{1.73_m2} (ref >59)
Globulin, Total: 2.5 g/dL (ref 1.5–4.5)
Glucose: 131 mg/dL — ABNORMAL HIGH (ref 65–99)
Potassium: 4.1 mmol/L (ref 3.5–5.2)
Sodium: 142 mmol/L (ref 134–144)
Total Protein: 6.5 g/dL (ref 6.0–8.5)

## 2016-11-30 LAB — LIPID PANEL
Chol/HDL Ratio: 4.6 ratio — ABNORMAL HIGH (ref 0.0–4.4)
Cholesterol, Total: 254 mg/dL — ABNORMAL HIGH (ref 100–199)
HDL: 55 mg/dL (ref >39)
LDL Calculated: 169 mg/dL — ABNORMAL HIGH (ref 0–99)
Triglycerides: 152 mg/dL — ABNORMAL HIGH (ref 0–149)
VLDL Cholesterol Cal: 30 mg/dL (ref 5–40)

## 2016-11-30 LAB — TSH: TSH: 4.69 u[IU]/mL — ABNORMAL HIGH (ref 0.450–4.500)

## 2016-11-30 LAB — HEMOGLOBIN A1C
Est. average glucose Bld gHb Est-mCnc: 146 mg/dL
Hgb A1c MFr Bld: 6.7 % — ABNORMAL HIGH (ref 4.8–5.6)

## 2016-11-30 NOTE — Telephone Encounter (Signed)
Patient advised as below. Patient verbalizes understanding and is in agreement with treatment plan.  

## 2016-11-30 NOTE — Telephone Encounter (Signed)
-----   Message from Margaretann LovelessJennifer M Burnette, PA-C sent at 11/30/2016  8:29 AM EDT ----- A1c is stable at 6.7. Cholesterol is up some from last year. Work on healthy lifestyle modifications including healthy dieting and slowly adding in physical activity. All other labs are stable and WNL.

## 2016-12-28 ENCOUNTER — Ambulatory Visit (INDEPENDENT_AMBULATORY_CARE_PROVIDER_SITE_OTHER): Payer: Medicare Other | Admitting: Physician Assistant

## 2016-12-28 ENCOUNTER — Encounter: Payer: Self-pay | Admitting: Physician Assistant

## 2016-12-28 VITALS — BP 138/60 | HR 69 | Temp 97.6°F | Resp 16 | Wt 188.8 lb

## 2016-12-28 DIAGNOSIS — M5412 Radiculopathy, cervical region: Secondary | ICD-10-CM | POA: Diagnosis not present

## 2016-12-28 DIAGNOSIS — M65311 Trigger thumb, right thumb: Secondary | ICD-10-CM | POA: Diagnosis not present

## 2016-12-28 NOTE — Patient Instructions (Signed)
Trigger Finger Trigger finger (stenosing tenosynovitis) is a condition that causes a finger to get stuck in a bent position. Each finger has a tough, cord-like tissue that connects muscle to bone (tendon), and each tendon is surrounded by a tunnel of tissue (tendon sheath). To move your finger, your tendon needs to slide freely through the sheath. Trigger finger happens when the tendon or the sheath thickens, making it difficult to move your finger. Trigger finger can affect any finger or a thumb. It may affect more than one finger. Mild cases may clear up with rest and medicine. Severe cases require more treatment. What are the causes? Trigger finger is caused by a thickened finger tendon or tendon sheath. The cause of this thickening is not known. What increases the risk? The following factors may make you more likely to develop this condition:  Doing activities that require a strong grip.  Having rheumatoid arthritis, gout, or diabetes.  Being 40-60 years old.  Being a woman.  What are the signs or symptoms? Symptoms of this condition include:  Pain when bending or straightening your finger.  Tenderness or swelling where your finger attaches to the palm of your hand.  A lump in the palm of your hand or on the inside of your finger.  Hearing a popping sound when you try to straighten your finger.  Feeling a popping, catching, or locking sensation when you try to straighten your finger.  Being unable to straighten your finger.  How is this diagnosed? This condition is diagnosed based on your symptoms and a physical exam. How is this treated? This condition may be treated by:  Resting your finger and avoiding activities that make symptoms worse.  Wearing a finger splint to keep your finger in a slightly bent position.  Taking NSAIDs to relieve pain and swelling.  Injecting medicine (steroids) into the tendon sheath to reduce swelling and irritation. Injections may need to be  repeated.  Having surgery to open the tendon sheath. This may be done if other treatments do not work and you cannot straighten your finger. You may need physical therapy after surgery.  Follow these instructions at home:  Use moist heat to help reduce pain and swelling as told by your health care provider.  Rest your finger and avoid activities that make pain worse. Return to normal activities as told by your health care provider.  If you have a splint, wear it as told by your health care provider.  Take over-the-counter and prescription medicines only as told by your health care provider.  Keep all follow-up visits as told by your health care provider. This is important. Contact a health care provider if:  Your symptoms are not improving with home care. Summary  Trigger finger (stenosing tenosynovitis) causes your finger to get stuck in a bent position, and it can make it difficult and painful to straighten your finger.  This condition develops when a finger tendon or tendon sheath thickens.  Treatment starts with resting, wearing a splint, and taking NSAIDs.  In severe cases, surgery to open the tendon sheath may be needed. This information is not intended to replace advice given to you by your health care provider. Make sure you discuss any questions you have with your health care provider. Document Released: 01/31/2004 Document Revised: 03/23/2016 Document Reviewed: 03/23/2016 Elsevier Interactive Patient Education  2017 Elsevier Inc.  

## 2016-12-28 NOTE — Progress Notes (Signed)
Patient: Nicole Lynn Female    DOB: 03-Jul-1940   76 y.o.   MRN: [id number] Visit Date: 12/28/2016  Today's Provider: [name], PA-C   Chief Complaint  Patient presents with  . Trigger finger   Subjective:    HPI Patient is here today with c/o trigger finger, right thumb with stiffness. No injury. She does have known OA of her hands bilaterally. She reports noticing over the last couple of months her right thumb has been becoming more tender and stiff. This past weekend she reports she was gardening and noticed she was having difficulty straightening her thumb after closing a fist or grasping things. She reports she would manually have to extend it with her other hand. Some increased swelling noted over palmar surface at 1st MTP joint.  She is also complaining of bilateral arm numbness. It is not persistent. Does not always occur in both arms at the same time. Some days she will not have any for a month or so, then she can sometimes have flares where it will occur 1-2 times a week. No notable weakness. Does report neck stiffness is worse when she has those symptoms as well.      Allergies  Allergen Reactions  . Latex      Current Outpatient Prescriptions:  .  atenolol (TENORMIN) 50 MG tablet, Take 1 tablet (50 mg total) by mouth daily., Disp: 90 tablet, Rfl: 3 .  chlorthalidone (HYGROTON) 25 MG tablet, Take 1 tablet (25 mg total) by mouth daily., Disp: 90 tablet, Rfl: 3 .  fish oil-omega-3 fatty acids 1000 MG capsule, Take 2 g by mouth daily., Disp: , Rfl:  .  glucose blood (ONETOUCH VERIO) test strip, Use to check blood sugar once daily, Disp: 100 each, Rfl: 5 .  latanoprost (XALATAN) 0.005 % ophthalmic solution, , Disp: , Rfl: 0 .  metFORMIN (GLUCOPHAGE) 500 MG tablet, take 1 tablet by mouth every morning WITH BREAKFAST, Disp: 30 tablet, Rfl: 5 .  Multiple Vitamins-Minerals (CENTRUM SILVER PO), Take by mouth once., Disp: , Rfl:  .  ONETOUCH DELICA LANCETS  FINE MISC, To check blood sugars once a day.  DX: E11.9, Disp: 100 each, Rfl: 1 .  zolpidem (AMBIEN) 10 MG tablet, Take 1 tablet (10 mg total) by mouth at bedtime. (Patient taking differently: Take 10 mg by mouth at bedtime as needed. ), Disp: 30 tablet, Rfl: 5 .  amoxicillin-clavulanate (AUGMENTIN) 875-125 MG tablet, Take 1 tablet by mouth 2 (two) times daily. (Patient not taking: Reported on 12/28/2016), Disp: 20 tablet, Rfl: 0  Review of Systems  Constitutional: Negative.   Respiratory: Negative.   Cardiovascular: Negative.   Gastrointestinal: Negative.   Musculoskeletal: Positive for arthralgias and joint swelling.  Neurological: Positive for numbness (arm go to sleep, at any time during the day). Negative for weakness.    Social History  Substance Use Topics  . Smoking status: Never Smoker  . Smokeless tobacco: Never Used  . Alcohol use 1.8 oz/week    3 Glasses of wine per week   Objective:   BP 138/60 (BP Location: Left Arm, Patient Position: Sitting, Cuff Size: Normal)   Pulse 69   Temp 97.6 F (36.4 C) (Oral)   Resp 16   Wt 188 lb 12.8 oz (85.6 kg)   SpO2 97%   BMI 35.67 kg/m     Physical Exam  Constitutional: She appears well-developed and well-nourished. No distress.  Neck: Normal range of motion and full  passive range of motion without pain. Neck supple. No JVD present. No spinous process tenderness and no muscular tenderness (tension noted in bilateral upper trap region, no specific spasm) present. No tracheal deviation and normal range of motion present. No thyromegaly present.  Cardiovascular: Normal rate, regular rhythm and normal heart sounds.  Exam reveals no gallop and no friction rub.   No murmur heard. Pulmonary/Chest: Effort normal and breath sounds normal. No respiratory distress. She has no wheezes. She has no rales.  Musculoskeletal:       Right hand: She exhibits swelling. She exhibits normal range of motion, no tenderness, no bony tenderness, normal  two-point discrimination and normal capillary refill. Normal sensation noted. Normal strength noted.       Hands: Lymphadenopathy:    She has no cervical adenopathy.  Skin: She is not diaphoretic.  Vitals reviewed.       Assessment & Plan:      1. Trigger finger of right thumb Will refer to ortho for treatment. Continue NSAIDs for now prn.  - Ambulatory referral to Orthopedic Surgery  2. Cervical radiculopathy Not bothering her at this time. Advised she can discuss with orthopedics if she wished. Offered imaging but she wanted to defer at this time. Will call if it starts to bother her again.        Margaretann LovelessJennifer M Kailah Pennel, PA-C  [facility]

## 2017-01-05 DIAGNOSIS — M65311 Trigger thumb, right thumb: Secondary | ICD-10-CM | POA: Diagnosis not present

## 2017-01-05 DIAGNOSIS — M1811 Unilateral primary osteoarthritis of first carpometacarpal joint, right hand: Secondary | ICD-10-CM | POA: Diagnosis not present

## 2017-01-05 DIAGNOSIS — M79644 Pain in right finger(s): Secondary | ICD-10-CM | POA: Diagnosis not present

## 2017-01-05 DIAGNOSIS — H40153 Residual stage of open-angle glaucoma, bilateral: Secondary | ICD-10-CM | POA: Diagnosis not present

## 2017-01-05 DIAGNOSIS — M79641 Pain in right hand: Secondary | ICD-10-CM | POA: Diagnosis not present

## 2017-02-07 DIAGNOSIS — M65311 Trigger thumb, right thumb: Secondary | ICD-10-CM | POA: Diagnosis not present

## 2017-04-05 DIAGNOSIS — L237 Allergic contact dermatitis due to plants, except food: Secondary | ICD-10-CM | POA: Diagnosis not present

## 2017-04-20 ENCOUNTER — Other Ambulatory Visit: Payer: Self-pay | Admitting: Physician Assistant

## 2017-04-20 DIAGNOSIS — I10 Essential (primary) hypertension: Secondary | ICD-10-CM

## 2017-05-04 DIAGNOSIS — H40153 Residual stage of open-angle glaucoma, bilateral: Secondary | ICD-10-CM | POA: Diagnosis not present

## 2017-05-18 ENCOUNTER — Telehealth: Payer: Self-pay

## 2017-05-18 NOTE — Telephone Encounter (Signed)
LMTCB if interested in scheduling her AWV with [facility].  -MM

## 2017-05-24 ENCOUNTER — Other Ambulatory Visit: Payer: Self-pay | Admitting: Physician Assistant

## 2017-05-24 DIAGNOSIS — E119 Type 2 diabetes mellitus without complications: Secondary | ICD-10-CM

## 2017-05-30 ENCOUNTER — Encounter: Payer: Self-pay | Admitting: Family Medicine

## 2017-05-30 ENCOUNTER — Ambulatory Visit: Payer: Medicare Other | Admitting: Family Medicine

## 2017-05-30 VITALS — BP 120/70 | HR 68 | Temp 97.8°F | Resp 16 | Wt 193.0 lb

## 2017-05-30 DIAGNOSIS — J069 Acute upper respiratory infection, unspecified: Secondary | ICD-10-CM

## 2017-05-30 MED ORDER — HYDROCODONE-HOMATROPINE 5-1.5 MG/5ML PO SYRP: ORAL_SOLUTION | ORAL | 0 refills | Status: DC

## 2017-05-30 NOTE — Patient Instructions (Addendum)
Discussed use of Mucinex D for congestion and Benadryl at night for post nasal drainage. Let me know at the end of the week if your sinuses are not improving.

## 2017-05-30 NOTE — Progress Notes (Signed)
Subjective:     Patient ID: Bonna Gainslke Martha Winemiller, female   DOB: 1940/05/20, 77 y.o.   MRN: [id number] Chief Complaint  Patient presents with  . Cough    Patient coms in office today with concerns of cough and congestion x 3 days. Patient stated that she just traveled back from [place] and reports sore throat in the evening, cough and congestion. Patient has tried taking prescription Hydrocodone-Homatropine for relief.    HPI Reports cold sx. States she is flying next week and is concerned about being better for the trip.  Review of Systems     Objective:   Physical Exam  Constitutional: She appears well-developed and well-nourished. No distress.  Ears: T.M's intact without inflammation Throat: no tonsillar enlargement or exudate Neck: no cervical adenopathy Lungs: clear     Assessment:    1. Viral upper respiratory tract infection - HYDROcodone-homatropine (HYCODAN) 5-1.5 MG/5ML syrup; 5 ml 4-6 hours as needed for cough  Dispense: 120 mL; Refill: 0    Plan:    Discussed use of otc medication. Phone f/u if not improving by the end of the week.

## 2017-06-21 ENCOUNTER — Ambulatory Visit: Payer: Medicare Other

## 2017-06-27 ENCOUNTER — Telehealth: Payer: Self-pay

## 2017-06-27 ENCOUNTER — Ambulatory Visit: Payer: Medicare Other

## 2017-06-27 NOTE — Telephone Encounter (Signed)
LMTCB and reschedule cancelled AWV from today. Pt needs her AWV scheduled prior to her CPE on 07/06/17. -MM

## 2017-06-28 ENCOUNTER — Ambulatory Visit: Payer: Medicare Other

## 2017-07-05 NOTE — Telephone Encounter (Signed)
Pt confirmed that she does have Blue Medicare so she is not elidgible to have the AWV prior to her CPE. Advised pt to come in at 2:00 PM tomorrow to see Antony ContrasJenni for her physical only.  -MM

## 2017-07-05 NOTE — Telephone Encounter (Signed)
Called son to see if number listed for pt is correct and still working. Called pt several times on that number and no one answers and there is no VM. May have a cancellation at 1:20 tomorrow [facility] Need to clarify insurance. If pt has BCBS Medicare then I will NOT be able to see pt. Asked son to have pt CB on my direct line.  -MM

## 2017-07-06 ENCOUNTER — Encounter: Payer: Self-pay | Admitting: Physician Assistant

## 2017-07-06 ENCOUNTER — Ambulatory Visit (INDEPENDENT_AMBULATORY_CARE_PROVIDER_SITE_OTHER): Payer: Medicare Other | Admitting: Physician Assistant

## 2017-07-06 ENCOUNTER — Other Ambulatory Visit: Payer: Self-pay

## 2017-07-06 VITALS — BP 138/80 | HR 74 | Temp 97.8°F | Resp 16 | Ht 61.0 in | Wt 186.4 lb

## 2017-07-06 DIAGNOSIS — E7800 Pure hypercholesterolemia, unspecified: Secondary | ICD-10-CM | POA: Diagnosis not present

## 2017-07-06 DIAGNOSIS — Z0001 Encounter for general adult medical examination with abnormal findings: Secondary | ICD-10-CM

## 2017-07-06 DIAGNOSIS — E038 Other specified hypothyroidism: Secondary | ICD-10-CM

## 2017-07-06 DIAGNOSIS — E119 Type 2 diabetes mellitus without complications: Secondary | ICD-10-CM | POA: Diagnosis not present

## 2017-07-06 DIAGNOSIS — I10 Essential (primary) hypertension: Secondary | ICD-10-CM

## 2017-07-06 DIAGNOSIS — E039 Hypothyroidism, unspecified: Secondary | ICD-10-CM | POA: Diagnosis not present

## 2017-07-06 DIAGNOSIS — Z0000 Encounter for general adult medical examination without abnormal findings: Secondary | ICD-10-CM

## 2017-07-06 MED ORDER — GLUCOSE BLOOD VI STRP: ORAL_STRIP | 5 refills | Status: DC

## 2017-07-06 NOTE — Progress Notes (Signed)
Patient: Nicole Lynn, Female    DOB: 10-Feb-1941, 77 y.o.   MRN: [id number] Visit Date: 07/06/2017  Today's Provider: [name], PA-C   Chief Complaint  Patient presents with  . Medicare Wellness   Subjective:    Annual wellness visit Nicole Lynn is a 77 y.o. female. She feels fairly well. She reports exercising. She reports she is sleeping well.  Last AWE:06/16/16 -----------------------------------------------------------   Review of Systems  Constitutional: Negative.   HENT: Negative.   Eyes: Negative.   Respiratory: Negative.   Cardiovascular: Positive for leg swelling.  Gastrointestinal: Negative.   Endocrine: Positive for heat intolerance and polydipsia.  Genitourinary: Negative.   Musculoskeletal: Positive for back pain and neck stiffness.  Skin: Positive for rash.  Allergic/Immunologic: Negative.   Neurological: Negative.   Hematological: Negative.   Psychiatric/Behavioral: Negative.     Social History   Socioeconomic History  . Marital status: Widowed    Spouse name: Not on file  . Number of children: Not on file  . Years of education: Not on file  . Highest education level: Not on file  Social Needs  . Financial resource strain: Not on file  . Food insecurity - worry: Not on file  . Food insecurity - inability: Not on file  . Transportation needs - medical: Not on file  . Transportation needs - non-medical: Not on file  Occupational History  . Not on file  Tobacco Use  . Smoking status: Never Smoker  . Smokeless tobacco: Never Used  Substance and Sexual Activity  . Alcohol use: Yes    Alcohol/week: 1.8 oz    Types: 3 Glasses of wine per week  . Drug use: No  . Sexual activity: No    Birth control/protection: Surgical, None    Comment: BTL/HYST  Other Topics Concern  . Not on file  Social History Narrative   Full time. Regularly exercises.     Past Medical History:  Diagnosis Date  . Anxiety   . Chest  pain, unspecified   . HTN (hypertension)   . Menopausal state   . Overweight(278.02)      Patient Active Problem List   Diagnosis Date Noted  . Diabetes mellitus (HCC) 06/06/2015  . Anxiety 05/16/2015  . Claustrophobia 05/16/2015  . Fatigue 05/16/2015  . Hypercholesteremia 05/16/2015  . Adiposity 05/16/2015  . Gonalgia 05/16/2015  . Subclinical hypothyroidism 05/16/2015  . History of recurrent UTIs 05/16/2015  . Insomnia 02/28/2015  . Varicose veins of leg with complications 02/04/2014  . Menopausal state   . HTN (hypertension)   . OVERWEIGHT/OBESITY 10/17/2008  . Chest pain 10/17/2008    Past Surgical History:  Procedure Laterality Date  . ABDOMINAL HYSTERECTOMY  2002   TAH/BSO  . APPENDECTOMY  1959  . BILATERAL SALPINGOOPHORECTOMY      Her family history includes Cancer in her other; Coronary artery disease in her other; Prostate cancer in her father; Stroke in her mother and other.      Current Outpatient Medications:  .  atenolol (TENORMIN) 50 MG tablet, Take 1 tablet (50 mg total) by mouth daily., Disp: 90 tablet, Rfl: 3 .  atenolol-chlorthalidone (TENORETIC) 50-25 MG tablet, take 1 tablet by mouth once daily, Disp: 90 tablet, Rfl: 3 .  fish oil-omega-3 fatty acids 1000 MG capsule, Take 2 g by mouth daily., Disp: , Rfl:  .  glucose blood (ONETOUCH VERIO) test strip, Use to check blood sugar once daily, Disp: 100 each, Rfl:  5 .  latanoprost (XALATAN) 0.005 % ophthalmic solution, , Disp: , Rfl: 0 .  metFORMIN (GLUCOPHAGE) 500 MG tablet, take 1 tablet by mouth every morning with BREAKFAST, Disp: 30 tablet, Rfl: 5 .  Multiple Vitamins-Minerals (CENTRUM SILVER PO), Take by mouth once., Disp: , Rfl:  .  ONETOUCH DELICA LANCETS FINE MISC, To check blood sugars once a day.  DX: E11.9, Disp: 100 each, Rfl: 1 .  zolpidem (AMBIEN) 10 MG tablet, Take 1 tablet (10 mg total) by mouth at bedtime. (Patient taking differently: Take 10 mg by mouth at bedtime as needed. ), Disp: 30  tablet, Rfl: 5 .  chlorthalidone (HYGROTON) 25 MG tablet, Take 1 tablet (25 mg total) by mouth daily. (Patient not taking: Reported on 05/30/2017), Disp: 90 tablet, Rfl: 3 .  HYDROcodone-homatropine (HYCODAN) 5-1.5 MG/5ML syrup, 5 ml 4-6 hours as needed for cough (Patient not taking: Reported on 07/06/2017), Disp: 120 mL, Rfl: 0  Patient Care Team: Margaretann LovelessBurnette, Erika Hussar M, PA-C as PCP - General (Family Medicine) Silverio Layivard, Sandra, MD as Consulting Physician (Obstetrics and Gynecology) Lennon Alstromorley, Sarah, MD as Referring Physician (Dermatology)     Objective:   Vitals: BP 138/80 (BP Location: Left Arm, Patient Position: Sitting, Cuff Size: Large)   Pulse 74   Temp 97.8 F (36.6 C) (Oral)   Resp 16   Ht 5\' 1"  (1.549 m)   Wt 186 lb 6.4 oz (84.6 kg)   SpO2 97%   BMI 35.22 kg/m   Physical Exam  Constitutional: She is oriented to person, place, and time. She appears well-developed and well-nourished.  HENT:  Head: Normocephalic and atraumatic.  Right Ear: Hearing, tympanic membrane, external ear and ear canal normal.  Left Ear: Hearing, tympanic membrane, external ear and ear canal normal.  Nose: Nose normal.  Mouth/Throat: Uvula is midline, oropharynx is clear and moist and mucous membranes are normal.  Eyes: Conjunctivae and EOM are normal. Pupils are equal, round, and reactive to light.  Neck: Normal range of motion. Neck supple. Carotid bruit is not present.  Cardiovascular: Normal rate, regular rhythm, normal heart sounds and intact distal pulses.  Pulmonary/Chest: Effort normal and breath sounds normal.  Abdominal: Soft. Bowel sounds are normal.  Musculoskeletal: Normal range of motion.  Neurological: She is alert and oriented to person, place, and time. She has normal reflexes.  Skin: Skin is warm and dry.  Psychiatric: She has a normal mood and affect. Her behavior is normal. Judgment and thought content normal.  Vitals reviewed.   Activities of Daily Living In your present state of  health, do you have any difficulty performing the following activities: 07/06/2017  Hearing? N  Vision? N  Difficulty concentrating or making decisions? N  Walking or climbing stairs? N  Dressing or bathing? N  Doing errands, shopping? N  Some recent data might be hidden    Fall Risk Assessment Fall Risk  07/06/2017 06/16/2016 05/16/2015  Falls in the past year? No No No     Depression Screen PHQ 2/9 Scores 07/06/2017 06/16/2016 05/16/2015  PHQ - 2 Score 0 0 0    Cognitive Testing - 6-CIT  Correct? Score   What year is it? yes 0 0 or 4  What month is it? yes 0 0 or 3  Memorize:    Floyde ParkinsJohn,  Smith,  [birth year],  [address],  [place],      What time is it? (within 1 hour) yes 0 0 or 3  Count backwards from 20 yes 0 0, 2, or  4  Name the months of the year yes 0 0, 2, or 4  Repeat name & address above yes 0 0, 2, 4, 6, 8, or 10       TOTAL SCORE  0/28   Interpretation:  Normal  Normal (0-7) Abnormal (8-28)       Assessment & Plan:     Annual Wellness Visit  Reviewed patient's Family Medical History Reviewed and updated list of patient's medical providers Assessment of cognitive impairment was done Assessed patient's functional ability Established a written schedule for health screening services Health Risk Assessent Completed and Reviewed  Exercise Activities and Dietary recommendations Goals    . Weight (lb) < 180 lb (81.6 kg)     Starting in 2018, I will work to lose 10 lbs this year.        Immunization History  Administered Date(s) Administered  . Influenza, High Dose Seasonal PF 01/05/2016  . Influenza,inj,Quad PF,6+ Mos 05/16/2015  . Pneumococcal Conjugate-13 05/16/2015  . Pneumococcal Polysaccharide-23 01/08/2013  . Zoster 10/25/2012    Health Maintenance  Topic Date Due  . Janet Berlin  10/04/1961  . INFLUENZA VACCINE  11/24/2016  . OPHTHALMOLOGY EXAM  04/26/2017  . HEMOGLOBIN A1C  06/01/2017  . FOOT EXAM  06/16/2017  . URINE MICROALBUMIN  06/16/2017  .  MAMMOGRAM  08/24/2017  . COLONOSCOPY  05/15/2025  . DEXA SCAN  Completed  . PNA vac Low Risk Adult  Completed     Discussed health benefits of physical activity, and encouraged her to engage in regular exercise appropriate for her age and condition.    1. Medicare annual wellness visit, subsequent Normal exam. Patient declines flu vaccine. Tdap not given due to insurance. Patient declines mammograms. Last colonoscopy was in 2017. May repeat if patient desires in 5-10 years. Patient still followed by Dr. Elesa Massed, GYN foe breast and pelvic exams.   2. Essential hypertension Stable. Continue atenolol-chlorthalidone 50-25mg . Will check labs as below and f/u pending results. - CBC w/Diff/Platelet - Comprehensive Metabolic Panel (CMET) - HgB A1c - Lipid Profile  3. Subclinical hypothyroidism Asymptomatic. Will check labs as below and f/u pending results. - TSH  4. Type 2 diabetes mellitus without complication, without long-term current use of insulin (HCC) Stable. Continue metformin 500mg  daily. Will check labs as below and f/u pending results. - CBC w/Diff/Platelet - Comprehensive Metabolic Panel (CMET) - HgB A1c - Lipid Profile - glucose blood (ONETOUCH VERIO) test strip; Use to check blood sugar once daily  Dispense: 100 each; Refill: 5  5. Hypercholesteremia Stable. On fish oil. Has declined statins in the past. Will check labs as below and f/u pending results. - CBC w/Diff/Platelet - Comprehensive Metabolic Panel (CMET) - HgB A1c - Lipid Profile  ------------------------------------------------------------------------------------------------------------    [name], PA-C  [facility]

## 2017-07-06 NOTE — Patient Instructions (Signed)
Health Maintenance for Postmenopausal Women Menopause is a normal process in which your reproductive ability comes to an end. This process happens gradually over a span of months to years, usually between the ages of 22 and 9. Menopause is complete when you have missed 12 consecutive menstrual periods. It is important to talk with your health care provider about some of the most common conditions that affect postmenopausal women, such as heart disease, cancer, and bone loss (osteoporosis). Adopting a healthy lifestyle and getting preventive care can help to promote your health and wellness. Those actions can also lower your chances of developing some of these common conditions. What should I know about menopause? During menopause, you may experience a number of symptoms, such as:  Moderate-to-severe hot flashes.  Night sweats.  Decrease in sex drive.  Mood swings.  Headaches.  Tiredness.  Irritability.  Memory problems.  Insomnia.  Choosing to treat or not to treat menopausal changes is an individual decision that you make with your health care provider. What should I know about hormone replacement therapy and supplements? Hormone therapy products are effective for treating symptoms that are associated with menopause, such as hot flashes and night sweats. Hormone replacement carries certain risks, especially as you become older. If you are thinking about using estrogen or estrogen with progestin treatments, discuss the benefits and risks with your health care provider. What should I know about heart disease and stroke? Heart disease, heart attack, and stroke become more likely as you age. This may be due, in part, to the hormonal changes that your body experiences during menopause. These can affect how your body processes dietary fats, triglycerides, and cholesterol. Heart attack and stroke are both medical emergencies. There are many things that you can do to help prevent heart disease  and stroke:  Have your blood pressure checked at least every 1-2 years. High blood pressure causes heart disease and increases the risk of stroke.  If you are 53-22 years old, ask your health care provider if you should take aspirin to prevent a heart attack or a stroke.  Do not use any tobacco products, including cigarettes, chewing tobacco, or electronic cigarettes. If you need help quitting, ask your health care provider.  It is important to eat a healthy diet and maintain a healthy weight. ? Be sure to include plenty of vegetables, fruits, low-fat dairy products, and lean protein. ? Avoid eating foods that are high in solid fats, added sugars, or salt (sodium).  Get regular exercise. This is one of the most important things that you can do for your health. ? Try to exercise for at least 150 minutes each week. The type of exercise that you do should increase your heart rate and make you sweat. This is known as moderate-intensity exercise. ? Try to do strengthening exercises at least twice each week. Do these in addition to the moderate-intensity exercise.  Know your numbers.Ask your health care provider to check your cholesterol and your blood glucose. Continue to have your blood tested as directed by your health care provider.  What should I know about cancer screening? There are several types of cancer. Take the following steps to reduce your risk and to catch any cancer development as early as possible. Breast Cancer  Practice breast self-awareness. ? This means understanding how your breasts normally appear and feel. ? It also means doing regular breast self-exams. Let your health care provider know about any changes, no matter how small.  If you are 40  or older, have a clinician do a breast exam (clinical breast exam or CBE) every year. Depending on your age, family history, and medical history, it may be recommended that you also have a yearly breast X-ray (mammogram).  If you  have a family history of breast cancer, talk with your health care provider about genetic screening.  If you are at high risk for breast cancer, talk with your health care provider about having an MRI and a mammogram every year.  Breast cancer (BRCA) gene test is recommended for women who have family members with BRCA-related cancers. Results of the assessment will determine the need for genetic counseling and BRCA1 and for BRCA2 testing. BRCA-related cancers include these types: ? Breast. This occurs in males or females. ? Ovarian. ? Tubal. This may also be called fallopian tube cancer. ? Cancer of the abdominal or pelvic lining (peritoneal cancer). ? Prostate. ? Pancreatic.  Cervical, Uterine, and Ovarian Cancer Your health care provider may recommend that you be screened regularly for cancer of the pelvic organs. These include your ovaries, uterus, and vagina. This screening involves a pelvic exam, which includes checking for microscopic changes to the surface of your cervix (Pap test).  For women ages 21-65, health care providers may recommend a pelvic exam and a Pap test every three years. For women ages 79-65, they may recommend the Pap test and pelvic exam, combined with testing for human papilloma virus (HPV), every five years. Some types of HPV increase your risk of cervical cancer. Testing for HPV may also be done on women of any age who have unclear Pap test results.  Other health care providers may not recommend any screening for nonpregnant women who are considered low risk for pelvic cancer and have no symptoms. Ask your health care provider if a screening pelvic exam is right for you.  If you have had past treatment for cervical cancer or a condition that could lead to cancer, you need Pap tests and screening for cancer for at least 20 years after your treatment. If Pap tests have been discontinued for you, your risk factors (such as having a new sexual partner) need to be  reassessed to determine if you should start having screenings again. Some women have medical problems that increase the chance of getting cervical cancer. In these cases, your health care provider may recommend that you have screening and Pap tests more often.  If you have a family history of uterine cancer or ovarian cancer, talk with your health care provider about genetic screening.  If you have vaginal bleeding after reaching menopause, tell your health care provider.  There are currently no reliable tests available to screen for ovarian cancer.  Lung Cancer Lung cancer screening is recommended for adults 69-62 years old who are at high risk for lung cancer because of a history of smoking. A yearly low-dose CT scan of the lungs is recommended if you:  Currently smoke.  Have a history of at least 30 pack-years of smoking and you currently smoke or have quit within the past 15 years. A pack-year is smoking an average of one pack of cigarettes per day for one year.  Yearly screening should:  Continue until it has been 15 years since you quit.  Stop if you develop a health problem that would prevent you from having lung cancer treatment.  Colorectal Cancer  This type of cancer can be detected and can often be prevented.  Routine colorectal cancer screening usually begins at  age 42 and continues through age 45.  If you have risk factors for colon cancer, your health care provider may recommend that you be screened at an earlier age.  If you have a family history of colorectal cancer, talk with your health care provider about genetic screening.  Your health care provider may also recommend using home test kits to check for hidden blood in your stool.  A small camera at the end of a tube can be used to examine your colon directly (sigmoidoscopy or colonoscopy). This is done to check for the earliest forms of colorectal cancer.  Direct examination of the colon should be repeated every  5-10 years until age 71. However, if early forms of precancerous polyps or small growths are found or if you have a family history or genetic risk for colorectal cancer, you may need to be screened more often.  Skin Cancer  Check your skin from head to toe regularly.  Monitor any moles. Be sure to tell your health care provider: ? About any new moles or changes in moles, especially if there is a change in a mole's shape or color. ? If you have a mole that is larger than the size of a pencil eraser.  If any of your family members has a history of skin cancer, especially at a young age, talk with your health care provider about genetic screening.  Always use sunscreen. Apply sunscreen liberally and repeatedly throughout the day.  Whenever you are outside, protect yourself by wearing long sleeves, pants, a wide-brimmed hat, and sunglasses.  What should I know about osteoporosis? Osteoporosis is a condition in which bone destruction happens more quickly than new bone creation. After menopause, you may be at an increased risk for osteoporosis. To help prevent osteoporosis or the bone fractures that can happen because of osteoporosis, the following is recommended:  If you are 46-71 years old, get at least 1,000 mg of calcium and at least 600 mg of vitamin D per day.  If you are older than age 55 but younger than age 65, get at least 1,200 mg of calcium and at least 600 mg of vitamin D per day.  If you are older than age 54, get at least 1,200 mg of calcium and at least 800 mg of vitamin D per day.  Smoking and excessive alcohol intake increase the risk of osteoporosis. Eat foods that are rich in calcium and vitamin D, and do weight-bearing exercises several times each week as directed by your health care provider. What should I know about how menopause affects my mental health? Depression may occur at any age, but it is more common as you become older. Common symptoms of depression  include:  Low or sad mood.  Changes in sleep patterns.  Changes in appetite or eating patterns.  Feeling an overall lack of motivation or enjoyment of activities that you previously enjoyed.  Frequent crying spells.  Talk with your health care provider if you think that you are experiencing depression. What should I know about immunizations? It is important that you get and maintain your immunizations. These include:  Tetanus, diphtheria, and pertussis (Tdap) booster vaccine.  Influenza every year before the flu season begins.  Pneumonia vaccine.  Shingles vaccine.  Your health care provider may also recommend other immunizations. This information is not intended to replace advice given to you by your health care provider. Make sure you discuss any questions you have with your health care provider. Document Released: 06/04/2005  Document Revised: 10/31/2015 Document Reviewed: 01/14/2015 Elsevier Interactive Patient Education  2018 [employer]vier Inc.  

## 2017-07-07 ENCOUNTER — Telehealth: Payer: Self-pay

## 2017-07-07 DIAGNOSIS — E7800 Pure hypercholesterolemia, unspecified: Secondary | ICD-10-CM

## 2017-07-07 LAB — COMPREHENSIVE METABOLIC PANEL
ALT: 23 IU/L (ref 0–32)
AST: 19 IU/L (ref 0–40)
Albumin/Globulin Ratio: 1.8 (ref 1.2–2.2)
Albumin: 4.5 g/dL (ref 3.5–4.8)
Alkaline Phosphatase: 57 IU/L (ref 39–117)
BUN/Creatinine Ratio: 32 — ABNORMAL HIGH (ref 12–28)
BUN: 18 mg/dL (ref 8–27)
Bilirubin Total: 0.4 mg/dL (ref 0.0–1.2)
CO2: 26 mmol/L (ref 20–29)
Calcium: 10.3 mg/dL (ref 8.7–10.3)
Chloride: 100 mmol/L (ref 96–106)
Creatinine, Ser: 0.56 mg/dL — ABNORMAL LOW (ref 0.57–1.00)
GFR calc Af Amer: 106 mL/min/{1.73_m2} (ref >59)
GFR calc non Af Amer: 92 mL/min/{1.73_m2} (ref >59)
Globulin, Total: 2.5 g/dL (ref 1.5–4.5)
Glucose: 102 mg/dL — ABNORMAL HIGH (ref 65–99)
Potassium: 3.3 mmol/L — ABNORMAL LOW (ref 3.5–5.2)
Sodium: 143 mmol/L (ref 134–144)
Total Protein: 7 g/dL (ref 6.0–8.5)

## 2017-07-07 LAB — CBC WITH DIFFERENTIAL/PLATELET
Basophils Absolute: 0 10*3/uL (ref 0.0–0.2)
Basos: 1 %
EOS (ABSOLUTE): 0.1 10*3/uL (ref 0.0–0.4)
Eos: 3 %
Hematocrit: 44.3 % (ref 34.0–46.6)
Hemoglobin: 14.8 g/dL (ref 11.1–15.9)
Immature Grans (Abs): 0 10*3/uL (ref 0.0–0.1)
Immature Granulocytes: 0 %
Lymphocytes Absolute: 1.8 10*3/uL (ref 0.7–3.1)
Lymphs: 34 %
MCH: 31.9 pg (ref 26.6–33.0)
MCHC: 33.4 g/dL (ref 31.5–35.7)
MCV: 96 fL (ref 79–97)
Monocytes Absolute: 0.5 10*3/uL (ref 0.1–0.9)
Monocytes: 9 %
Neutrophils Absolute: 2.8 10*3/uL (ref 1.4–7.0)
Neutrophils: 53 %
Platelets: 265 10*3/uL (ref 150–379)
RBC: 4.64 x10E6/uL (ref 3.77–5.28)
RDW: 13.7 % (ref 12.3–15.4)
WBC: 5.2 10*3/uL (ref 3.4–10.8)

## 2017-07-07 LAB — HEMOGLOBIN A1C
Est. average glucose Bld gHb Est-mCnc: 146 mg/dL
Hgb A1c MFr Bld: 6.7 % — ABNORMAL HIGH (ref 4.8–5.6)

## 2017-07-07 LAB — LIPID PANEL
Chol/HDL Ratio: 5 ratio — ABNORMAL HIGH (ref 0.0–4.4)
Cholesterol, Total: 266 mg/dL — ABNORMAL HIGH (ref 100–199)
HDL: 53 mg/dL (ref >39)
LDL Calculated: 162 mg/dL — ABNORMAL HIGH (ref 0–99)
Triglycerides: 255 mg/dL — ABNORMAL HIGH (ref 0–149)
VLDL Cholesterol Cal: 51 mg/dL — ABNORMAL HIGH (ref 5–40)

## 2017-07-07 LAB — TSH: TSH: 3.2 u[IU]/mL (ref 0.450–4.500)

## 2017-07-07 NOTE — Telephone Encounter (Signed)
LMTCB

## 2017-07-07 NOTE — Telephone Encounter (Signed)
-----   Message from Margaretann LovelessJennifer M Burnette, PA-C sent at 07/07/2017  9:26 AM EDT ----- Labs are all stable with exception of cholesterol which continues to slowly increase. I would recommend starting medication to lower your cholesterol and try to prevent a cardiovascular event if agreeable.

## 2017-07-08 MED ORDER — SIMVASTATIN 10 MG PO TABS: 10.0000 mg | ORAL_TABLET | Freq: Every day | ORAL | 3 refills | Status: DC

## 2017-07-08 NOTE — Telephone Encounter (Signed)
Simvastatin sent in. 

## 2017-07-08 NOTE — Telephone Encounter (Signed)
Patient agrees to start lowering cholesterol medication.

## 2017-07-22 ENCOUNTER — Other Ambulatory Visit: Payer: Self-pay | Admitting: Physician Assistant

## 2017-07-22 DIAGNOSIS — E119 Type 2 diabetes mellitus without complications: Secondary | ICD-10-CM

## 2017-07-22 MED ORDER — GLUCOSE BLOOD VI STRP: ORAL_STRIP | 5 refills | Status: DC

## 2017-07-22 NOTE — Telephone Encounter (Signed)
Is it ok to send new prescription for test strips to [facility] [address]?

## 2017-07-22 NOTE — Telephone Encounter (Signed)
Pt came in office request we sent the following prescription to [facility] on [address]   [facility] Thanks CC  glucose blood (ONETOUCH VERIO) test strip

## 2017-07-22 NOTE — Telephone Encounter (Signed)
°  Requesting Test Strips for One Touch Verio be sent into [facility] [address]

## 2017-07-28 ENCOUNTER — Telehealth: Payer: Self-pay | Admitting: Physician Assistant

## 2017-07-28 NOTE — Telephone Encounter (Signed)
Pt returned called  ° °Thanks teri °

## 2017-07-28 NOTE — Telephone Encounter (Signed)
[facility] [address]

## 2017-07-28 NOTE — Telephone Encounter (Signed)
Pt called regarding her glucose test strips One Touch.  [facility]  [facility].  [facility] [address]  Pt's call back is [phone]  Thanks teri

## 2017-07-28 NOTE — Telephone Encounter (Signed)
It looks like Antony ContrasJenni already sent test strips into [facility] on 3/29. Called the patient to verify which pharmacy she would like this sent into, but no answer. Will try again later.

## 2017-08-02 ENCOUNTER — Telehealth: Payer: Self-pay | Admitting: Physician Assistant

## 2017-08-02 DIAGNOSIS — E119 Type 2 diabetes mellitus without complications: Secondary | ICD-10-CM

## 2017-08-02 MED ORDER — GLUCOSE BLOOD VI STRP: ORAL_STRIP | 12 refills | Status: DC

## 2017-08-02 NOTE — Telephone Encounter (Signed)
Pt came into office requesting prescription for glucose blood (ONETOUCH VERIO) test strip .She wasadvised that you are gone for the rest of the day

## 2017-08-02 NOTE — Telephone Encounter (Signed)
Was sent to [facility]

## 2017-08-09 ENCOUNTER — Other Ambulatory Visit: Payer: Self-pay | Admitting: Physician Assistant

## 2017-08-09 DIAGNOSIS — E119 Type 2 diabetes mellitus without complications: Secondary | ICD-10-CM

## 2017-08-09 MED ORDER — GLUCOSE BLOOD VI STRP: ORAL_STRIP | 11 refills | Status: DC

## 2017-08-09 NOTE — Progress Notes (Signed)
Refilled test strips.

## 2017-08-23 DIAGNOSIS — E119 Type 2 diabetes mellitus without complications: Secondary | ICD-10-CM | POA: Diagnosis not present

## 2017-09-08 DIAGNOSIS — H524 Presbyopia: Secondary | ICD-10-CM | POA: Diagnosis not present

## 2017-09-08 DIAGNOSIS — H40153 Residual stage of open-angle glaucoma, bilateral: Secondary | ICD-10-CM | POA: Diagnosis not present

## 2017-11-17 ENCOUNTER — Other Ambulatory Visit: Payer: Self-pay | Admitting: Physician Assistant

## 2017-11-17 DIAGNOSIS — E119 Type 2 diabetes mellitus without complications: Secondary | ICD-10-CM

## 2017-11-17 DIAGNOSIS — Z01419 Encounter for gynecological examination (general) (routine) without abnormal findings: Secondary | ICD-10-CM | POA: Diagnosis not present

## 2017-11-17 DIAGNOSIS — Z1211 Encounter for screening for malignant neoplasm of colon: Secondary | ICD-10-CM | POA: Diagnosis not present

## 2017-11-17 DIAGNOSIS — Z6834 Body mass index (BMI) 34.0-34.9, adult: Secondary | ICD-10-CM | POA: Diagnosis not present

## 2017-11-17 DIAGNOSIS — Z713 Dietary counseling and surveillance: Secondary | ICD-10-CM | POA: Diagnosis not present

## 2017-11-17 MED ORDER — METFORMIN HCL 500 MG PO TABS: 500.0000 mg | ORAL_TABLET | Freq: Every day | ORAL | 5 refills | Status: DC

## 2017-11-17 NOTE — Telephone Encounter (Signed)
Pt contacted office for refill request on the following medications:  metFORMIN (GLUCOPHAGE) 500 MG tablet  [facility] [address] [facility] [place]   Please advise. Thanks TNP

## 2017-11-28 ENCOUNTER — Other Ambulatory Visit: Payer: Self-pay | Admitting: Physician Assistant

## 2017-11-28 DIAGNOSIS — E119 Type 2 diabetes mellitus without complications: Secondary | ICD-10-CM

## 2018-01-04 DIAGNOSIS — H40153 Residual stage of open-angle glaucoma, bilateral: Secondary | ICD-10-CM | POA: Diagnosis not present

## 2018-01-19 DIAGNOSIS — L570 Actinic keratosis: Secondary | ICD-10-CM | POA: Diagnosis not present

## 2018-01-19 DIAGNOSIS — L821 Other seborrheic keratosis: Secondary | ICD-10-CM | POA: Diagnosis not present

## 2018-01-19 DIAGNOSIS — L304 Erythema intertrigo: Secondary | ICD-10-CM | POA: Diagnosis not present

## 2018-01-19 DIAGNOSIS — L218 Other seborrheic dermatitis: Secondary | ICD-10-CM | POA: Diagnosis not present

## 2018-02-08 ENCOUNTER — Ambulatory Visit: Payer: Medicare Other | Admitting: Physician Assistant

## 2018-02-08 ENCOUNTER — Encounter: Payer: Self-pay | Admitting: Physician Assistant

## 2018-02-08 VITALS — BP 120/70 | HR 68 | Temp 97.6°F | Resp 16 | Wt 186.8 lb

## 2018-02-08 DIAGNOSIS — E119 Type 2 diabetes mellitus without complications: Secondary | ICD-10-CM

## 2018-02-08 DIAGNOSIS — E7800 Pure hypercholesterolemia, unspecified: Secondary | ICD-10-CM

## 2018-02-08 DIAGNOSIS — I10 Essential (primary) hypertension: Secondary | ICD-10-CM

## 2018-02-08 DIAGNOSIS — Z23 Encounter for immunization: Secondary | ICD-10-CM

## 2018-02-08 DIAGNOSIS — F5101 Primary insomnia: Secondary | ICD-10-CM

## 2018-02-08 MED ORDER — ZOLPIDEM TARTRATE 10 MG PO TABS: 10.0000 mg | ORAL_TABLET | Freq: Every evening | ORAL | 5 refills | Status: DC | PRN

## 2018-02-08 NOTE — Patient Instructions (Signed)
Cervical contour neck pillow memory foam firm

## 2018-02-08 NOTE — Progress Notes (Signed)
Patient: Nicole Lynn Female    DOB: [birth year]   77 y.o.   MRN: [id number] Visit Date: 02/08/2018  Today's Provider: [name], PA-C   Chief Complaint  Patient presents with  . Follow-up    Insomnia,HTN,HLD   Subjective:    HPI   Insomnia, Follow up:  She reports excellent compliance with treatment. She is not having side effects. . Patient currently stable on Zolpidem 10mg .   Diabetes Mellitus Type II, Follow-up:   Lab Results  Component Value Date   HGBA1C 6.7 (H) 07/06/2017   HGBA1C 6.7 (H) 11/29/2016   HGBA1C 6.7 04/12/2016    Last seen for diabetes 6 months ago.  Management since then includes none. She reports excellent compliance with treatment. She is not having side effects.  Current symptoms include cotton mouth and have been unchanged. Home blood sugar records: fasting range: 100's-130's  Episodes of hypoglycemia? no   Current Insulin Regimen: no Most Recent Eye Exam: a month ago per patient it was Normal, No Retinopathy and is scheduled for follow-up in 6 months. Weight trend: stable Prior visit with dietician: no Current diet: well balanced Current exercise: walking and yard work  Pertinent Labs:    Component Value Date/Time   CHOL 266 (H) 07/06/2017 1458   TRIG 255 (H) 07/06/2017 1458   HDL 53 07/06/2017 1458   LDLCALC 162 (H) 07/06/2017 1458   CREATININE 0.56 (L) 07/06/2017 1458   CREATININE 0.54 (L) 06/21/2013 0809    Wt Readings from Last 3 Encounters:  02/08/18 186 lb 12.8 oz (84.7 kg)  07/06/17 186 lb 6.4 oz (84.6 kg)  05/30/17 193 lb (87.5 kg)    ------------------------------------------------------------------------  Lipid/Cholesterol, Follow-up:   Last seen for this6 months ago.  Management changes since that visit include Simvastatin 10mg . . Last Lipid Panel:    Component Value Date/Time   CHOL 266 (H) 07/06/2017 1458   TRIG 255 (H) 07/06/2017 1458   HDL 53 07/06/2017 1458   CHOLHDL 5.0  (H) 07/06/2017 1458   LDLCALC 162 (H) 07/06/2017 1458    Risk factors for vascular disease include diabetes mellitus, hypercholesterolemia and hypertension  She reports excellent compliance with treatment. She is not having side effects.  Current symptoms include none and have been stable. Weight trend: stable Prior visit with dietician: no Current diet: well balanced Current exercise: walking and yard work  [employer] Readings from Last 3 Encounters:  02/08/18 186 lb 12.8 oz (84.7 kg)  07/06/17 186 lb 6.4 oz (84.6 kg)  05/30/17 193 lb (87.5 kg)    -------------------------------------------------------------------  Hypertension, follow-up:  BP Readings from Last 3 Encounters:  02/08/18 120/70  07/06/17 138/80  05/30/17 120/70    She was last seen for hypertension 6 months ago.  BP at that visit was 138/80 Management since that visit includes none. She reports excellent compliance with treatment. She is not having side effects.  She is exercising. She is adherent to low salt diet.   Outside blood pressures are n/a. She is experiencing fatigue and arms have some tingling/numbness on and off at night.  Patient denies chest pain, chest pressure/discomfort, claudication, exertional chest pressure/discomfort, irregular heart beat, lower extremity edema, near-syncope and palpitations.   Cardiovascular risk factors include advanced age (older than 52 for men, 83 for women), diabetes mellitus, dyslipidemia, hypertension and obesity (BMI >= 30 kg/m2).    Weight trend: stable Wt Readings from Last 3 Encounters:  02/08/18 186 lb 12.8 oz (84.7 kg)  07/06/17 186 lb 6.4 oz (84.6 kg)  05/30/17 193 lb (87.5 kg)    Current diet: well balanced  ------------------------------------------------------------------------      Allergies  Allergen Reactions  . Latex      Current Outpatient Medications:  .  atenolol-chlorthalidone (TENORETIC) 50-25 MG tablet, take 1 tablet by mouth once  daily, Disp: 90 tablet, Rfl: 3 .  fish oil-omega-3 fatty acids 1000 MG capsule, Take 2 g by mouth daily., Disp: , Rfl:  .  glucose blood (ONETOUCH VERIO) test strip, Use to check blood sugar once daily, Disp: 100 each, Rfl: 11 .  latanoprost (XALATAN) 0.005 % ophthalmic solution, , Disp: , Rfl: 0 .  metFORMIN (GLUCOPHAGE) 500 MG tablet, TAKE 1 TABLET BY MOUTH EVERY MORNING WITH BREAKFAST, Disp: 90 tablet, Rfl: 1 .  Multiple Vitamins-Minerals (CENTRUM SILVER PO), Take by mouth once., Disp: , Rfl:  .  ONETOUCH DELICA LANCETS FINE MISC, To check blood sugars once a day.  DX: E11.9, Disp: 100 each, Rfl: 1 .  simvastatin (ZOCOR) 10 MG tablet, Take 1 tablet (10 mg total) by mouth at bedtime., Disp: 90 tablet, Rfl: 3 .  zolpidem (AMBIEN) 10 MG tablet, Take 1 tablet (10 mg total) by mouth at bedtime. (Patient taking differently: Take 10 mg by mouth at bedtime as needed. ), Disp: 30 tablet, Rfl: 5  Review of Systems  Constitutional: Negative.   Respiratory: Negative.   Cardiovascular: Negative.   Endocrine: Negative.   Neurological: Negative.     Social History   Tobacco Use  . Smoking status: Never Smoker  . Smokeless tobacco: Never Used  Substance Use Topics  . Alcohol use: Yes    Alcohol/week: 3.0 standard drinks    Types: 3 Glasses of wine per week   Objective:   BP 120/70 (BP Location: Left Arm, Patient Position: Sitting, Cuff Size: Normal)   Pulse 68   Temp 97.6 F (36.4 C) (Oral)   Resp 16   Wt 186 lb 12.8 oz (84.7 kg)   SpO2 96%   BMI 35.30 kg/m  Vitals:   02/08/18 1044  BP: 120/70  Pulse: 68  Resp: 16  Temp: 97.6 F (36.4 C)  TempSrc: Oral  SpO2: 96%  Weight: 186 lb 12.8 oz (84.7 kg)     Physical Exam  Constitutional: She appears well-developed and well-nourished. No distress.  Neck: Normal range of motion. Neck supple.  Cardiovascular: Normal rate, regular rhythm and normal heart sounds. Exam reveals no gallop and no friction rub.  No murmur  heard. Pulmonary/Chest: Effort normal and breath sounds normal. No respiratory distress. She has no wheezes. She has no rales.  Skin: She is not diaphoretic.  Vitals reviewed.       Assessment & Plan:     1. Essential hypertension Stable. Continue atenolol-chlorthalidone 50-25mg . Will check labs as below and f/u pending results. I will see her back in 6 months for AWV. - Basic Metabolic Panel (BMET)  2. Type 2 diabetes mellitus without complication, without long-term current use of insulin (HCC) Stable. Continue metformin 500mg . Will check labs as below and f/u pending results. - Hemoglobin A1c - Basic Metabolic Panel (BMET)  3. Hypercholesteremia Stable. Continue simvastatin 10mg . Will check labs as below and f/u pending results. - Lipid panel - Basic Metabolic Panel (BMET)  4. Primary insomnia Uses prn. Last refill was 2016. Only uses mostly when she is on vacation sleeping in a different bed. Refilled as below.  - zolpidem (AMBIEN) 10 MG tablet; Take 1 tablet (  10 mg total) by mouth at bedtime as needed.  Dispense: 30 tablet; Refill: 5  5. Need for influenza vaccination Flu vaccine given today without complication. Patient sat upright for 15 minutes to check for adverse reaction before being released. - Flu vaccine HIGH DOSE PF       [name], PA-C  [facility]

## 2018-02-09 ENCOUNTER — Telehealth: Payer: Self-pay

## 2018-02-09 LAB — BASIC METABOLIC PANEL
BUN/Creatinine Ratio: 32 — ABNORMAL HIGH (ref 12–28)
BUN: 19 mg/dL (ref 8–27)
CO2: 25 mmol/L (ref 20–29)
Calcium: 9.9 mg/dL (ref 8.7–10.3)
Chloride: 100 mmol/L (ref 96–106)
Creatinine, Ser: 0.59 mg/dL (ref 0.57–1.00)
GFR calc Af Amer: 104 mL/min/{1.73_m2} (ref >59)
GFR calc non Af Amer: 90 mL/min/{1.73_m2} (ref >59)
Glucose: 110 mg/dL — ABNORMAL HIGH (ref 65–99)
Potassium: 3.7 mmol/L (ref 3.5–5.2)
Sodium: 138 mmol/L (ref 134–144)

## 2018-02-09 LAB — HEMOGLOBIN A1C
Est. average glucose Bld gHb Est-mCnc: 146 mg/dL
Hgb A1c MFr Bld: 6.7 % — ABNORMAL HIGH (ref 4.8–5.6)

## 2018-02-09 LAB — LIPID PANEL
Chol/HDL Ratio: 4.9 ratio — ABNORMAL HIGH (ref 0.0–4.4)
Cholesterol, Total: 251 mg/dL — ABNORMAL HIGH (ref 100–199)
HDL: 51 mg/dL (ref >39)
LDL Calculated: 162 mg/dL — ABNORMAL HIGH (ref 0–99)
Triglycerides: 188 mg/dL — ABNORMAL HIGH (ref 0–149)
VLDL Cholesterol Cal: 38 mg/dL (ref 5–40)

## 2018-02-09 NOTE — Telephone Encounter (Signed)
Patient advised as directed below. 

## 2018-02-09 NOTE — Telephone Encounter (Signed)
-----   Message from [name], PA-C sent at 02/09/2018 11:17 AM EDT ----- Cholesterol still elevated but improved from last year. Sugar stable at 6.7. Kidney function normal.

## 2018-02-23 ENCOUNTER — Other Ambulatory Visit: Payer: Self-pay | Admitting: Physician Assistant

## 2018-02-23 DIAGNOSIS — I10 Essential (primary) hypertension: Secondary | ICD-10-CM

## 2018-02-23 NOTE — Telephone Encounter (Signed)
Nicole Lynn's patient 

## 2018-05-26 ENCOUNTER — Other Ambulatory Visit: Payer: Self-pay | Admitting: Family Medicine

## 2018-05-26 DIAGNOSIS — I10 Essential (primary) hypertension: Secondary | ICD-10-CM

## 2018-08-03 ENCOUNTER — Other Ambulatory Visit: Payer: Self-pay | Admitting: Physician Assistant

## 2018-08-03 DIAGNOSIS — E7800 Pure hypercholesterolemia, unspecified: Secondary | ICD-10-CM

## 2018-08-14 ENCOUNTER — Ambulatory Visit: Payer: Self-pay | Admitting: Physician Assistant

## 2018-09-06 ENCOUNTER — Other Ambulatory Visit: Payer: Self-pay | Admitting: Physician Assistant

## 2018-09-06 DIAGNOSIS — F5101 Primary insomnia: Secondary | ICD-10-CM

## 2018-10-04 DIAGNOSIS — H40153 Residual stage of open-angle glaucoma, bilateral: Secondary | ICD-10-CM | POA: Diagnosis not present

## 2018-10-04 DIAGNOSIS — H524 Presbyopia: Secondary | ICD-10-CM | POA: Diagnosis not present

## 2018-10-16 ENCOUNTER — Encounter: Payer: Self-pay | Admitting: Physician Assistant

## 2018-10-16 ENCOUNTER — Other Ambulatory Visit: Payer: Self-pay

## 2018-10-16 ENCOUNTER — Ambulatory Visit (INDEPENDENT_AMBULATORY_CARE_PROVIDER_SITE_OTHER): Payer: Medicare Other | Admitting: Physician Assistant

## 2018-10-16 VITALS — BP 145/77 | HR 66 | Temp 98.2°F | Resp 16 | Ht 64.0 in | Wt 185.0 lb

## 2018-10-16 DIAGNOSIS — Z0000 Encounter for general adult medical examination without abnormal findings: Secondary | ICD-10-CM

## 2018-10-16 DIAGNOSIS — E039 Hypothyroidism, unspecified: Secondary | ICD-10-CM

## 2018-10-16 DIAGNOSIS — E7800 Pure hypercholesterolemia, unspecified: Secondary | ICD-10-CM

## 2018-10-16 DIAGNOSIS — I10 Essential (primary) hypertension: Secondary | ICD-10-CM

## 2018-10-16 DIAGNOSIS — E119 Type 2 diabetes mellitus without complications: Secondary | ICD-10-CM | POA: Diagnosis not present

## 2018-10-16 DIAGNOSIS — E038 Other specified hypothyroidism: Secondary | ICD-10-CM

## 2018-10-16 MED ORDER — METFORMIN HCL 500 MG PO TABS: 500.0000 mg | ORAL_TABLET | Freq: Every day | ORAL | 3 refills | Status: DC

## 2018-10-16 MED ORDER — ATENOLOL-CHLORTHALIDONE 50-25 MG PO TABS: 1.0000 | ORAL_TABLET | Freq: Every day | ORAL | 3 refills | Status: DC

## 2018-10-16 NOTE — Patient Instructions (Signed)
Health Maintenance After Age 76 After age 76, you are at a higher risk for certain long-term diseases and infections as well as injuries from falls. Falls are a major cause of broken bones and head injuries in people who are older than age 76. Getting regular preventive care can help to keep you healthy and well. Preventive care includes getting regular testing and making lifestyle changes as recommended by your health care provider. Talk with your health care provider about:  Which screenings and tests you should have. A screening is a test that checks for a disease when you have no symptoms.  A diet and exercise plan that is right for you. What should I know about screenings and tests to prevent falls? Screening and testing are the best ways to find a health problem early. Early diagnosis and treatment give you the best chance of managing medical conditions that are common after age 76. Certain conditions and lifestyle choices may make you more likely to have a fall. Your health care provider may recommend:  Regular vision checks. Poor vision and conditions such as cataracts can make you more likely to have a fall. If you wear glasses, make sure to get your prescription updated if your vision changes.  Medicine review. Work with your health care provider to regularly review all of the medicines you are taking, including over-the-counter medicines. Ask your health care provider about any side effects that may make you more likely to have a fall. Tell your health care provider if any medicines that you take make you feel dizzy or sleepy.  Osteoporosis screening. Osteoporosis is a condition that causes the bones to get weaker. This can make the bones weak and cause them to break more easily.  Blood pressure screening. Blood pressure changes and medicines to control blood pressure can make you feel dizzy.  Strength and balance checks. Your health care provider may recommend certain tests to check your  strength and balance while standing, walking, or changing positions.  Foot health exam. Foot pain and numbness, as well as not wearing proper footwear, can make you more likely to have a fall.  Depression screening. You may be more likely to have a fall if you have a fear of falling, feel emotionally low, or feel unable to do activities that you used to do.  Alcohol use screening. Using too much alcohol can affect your balance and may make you more likely to have a fall. What actions can I take to lower my risk of falls? General instructions  Talk with your health care provider about your risks for falling. Tell your health care provider if: ? You fall. Be sure to tell your health care provider about all falls, even ones that seem minor. ? You feel dizzy, sleepy, or off-balance.  Take over-the-counter and prescription medicines only as told by your health care provider. These include any supplements.  Eat a healthy diet and maintain a healthy weight. A healthy diet includes low-fat dairy products, low-fat (lean) meats, and fiber from whole grains, beans, and lots of fruits and vegetables. Home safety  Remove any tripping hazards, such as rugs, cords, and clutter.  Install safety equipment such as grab bars in bathrooms and safety rails on stairs.  Keep rooms and walkways well-lit. Activity   Follow a regular exercise program to stay fit. This will help you maintain your balance. Ask your health care provider what types of exercise are appropriate for you.  If you need a cane or   walker, use it as recommended by your health care provider.  Wear supportive shoes that have nonskid soles. Lifestyle  Do not drink alcohol if your health care provider tells you not to drink.  If you drink alcohol, limit how much you have: ? 0-1 drink a day for women. ? 0-2 drinks a day for men.  Be aware of how much alcohol is in your drink. In the [place] one drink equals one typical bottle of beer (12  oz), one-half glass of wine (5 oz), or one shot of hard liquor (1 oz).  Do not use any products that contain nicotine or tobacco, such as cigarettes and e-cigarettes. If you need help quitting, ask your health care provider. Summary  Having a healthy lifestyle and getting preventive care can help to protect your health and wellness after age 76.  Screening and testing are the best way to find a health problem early and help you avoid having a fall. Early diagnosis and treatment give you the best chance for managing medical conditions that are more common for people who are older than age 76.  Falls are a major cause of broken bones and head injuries in people who are older than age 76. Take precautions to prevent a fall at home.  Work with your health care provider to learn what changes you can make to improve your health and wellness and to prevent falls. This information is not intended to replace advice given to you by your health care provider. Make sure you discuss any questions you have with your health care provider. Document Released: 02/23/2017 Document Revised: 02/23/2017 Document Reviewed: 02/23/2017 Elsevier Interactive Patient Education  2019 Elsevier Inc.  

## 2018-10-16 NOTE — Progress Notes (Signed)
Patient: Nicole Lynn, Female    DOB: [birth year], 78 y.o.   MRN: [id number] Visit Date: 10/16/2018  Today's Provider: Margaretann LovelessJennifer M Fransheska Willingham, PA-C   Chief Complaint  Patient presents with  . Medicare Wellness   Subjective:     Annual wellness visit Nicole Lynn is a 78 y.o. female. She feels well. She reports exercising. She reports she is sleeping well. ----------------------------------------------------------- Patient reports that she stopped the Simvastatin because it was making her feel very tired.  Review of Systems  Constitutional: Negative.   HENT: Negative.   Eyes: Negative.   Respiratory: Negative.   Cardiovascular: Negative.   Gastrointestinal: Negative.   Endocrine: Negative.   Genitourinary: Negative.   Musculoskeletal: Positive for neck pain.  Skin: Negative.   Allergic/Immunologic: Negative.   Neurological: Negative.   Hematological: Negative.   Psychiatric/Behavioral: Negative.     Social History   Socioeconomic History  . Marital status: Widowed    Spouse name: Not on file  . Number of children: Not on file  . Years of education: Not on file  . Highest education level: Not on file  Occupational History  . Not on file  Social Needs  . Financial resource strain: Not on file  . Food insecurity    Worry: Not on file    Inability: Not on file  . Transportation needs    Medical: Not on file    Non-medical: Not on file  Tobacco Use  . Smoking status: Never Smoker  . Smokeless tobacco: Never Used  Substance and Sexual Activity  . Alcohol use: Yes    Alcohol/week: 3.0 standard drinks    Types: 3 Glasses of wine per week  . Drug use: No  . Sexual activity: Never    Birth control/protection: Surgical, None    Comment: BTL/HYST  Lifestyle  . Physical activity    Days per week: Not on file    Minutes per session: Not on file  . Stress: Not on file  Relationships  . Social Musicianconnections    Talks on phone: Not on file   Gets together: Not on file    Attends religious service: Not on file    Active member of club or organization: Not on file    Attends meetings of clubs or organizations: Not on file    Relationship status: Not on file  . Intimate partner violence    Fear of current or ex partner: Not on file    Emotionally abused: Not on file    Physically abused: Not on file    Forced sexual activity: Not on file  Other Topics Concern  . Not on file  Social History Narrative   Full time. Regularly exercises.     Past Medical History:  Diagnosis Date  . Anxiety   . Chest pain, unspecified   . HTN (hypertension)   . Menopausal state   . Overweight(278.02)      Patient Active Problem List   Diagnosis Date Noted  . Diabetes mellitus (HCC) 06/06/2015  . Anxiety 05/16/2015  . Claustrophobia 05/16/2015  . Fatigue 05/16/2015  . Hypercholesteremia 05/16/2015  . Adiposity 05/16/2015  . Gonalgia 05/16/2015  . Subclinical hypothyroidism 05/16/2015  . History of recurrent UTIs 05/16/2015  . Insomnia 02/28/2015  . Varicose veins of leg with complications 02/04/2014  . Menopausal state   . HTN (hypertension)   . OVERWEIGHT/OBESITY 10/17/2008  . Chest pain 10/17/2008    Past Surgical History:  Procedure Laterality  Date  . ABDOMINAL HYSTERECTOMY  2002   TAH/BSO  . APPENDECTOMY  1959  . BILATERAL SALPINGOOPHORECTOMY      Her family history includes Cancer in an other family member; Coronary artery disease in an other family member; Prostate cancer in her father; Stroke in her mother and another family member.   Current Outpatient Medications:  .  atenolol-chlorthalidone (TENORETIC) 50-25 MG tablet, TAKE 1 TABLET BY MOUTH EVERY DAY, Disp: 90 tablet, Rfl: 1 .  fish oil-omega-3 fatty acids 1000 MG capsule, Take 2 g by mouth daily., Disp: , Rfl:  .  glucose blood (ONETOUCH VERIO) test strip, Use to check blood sugar once daily, Disp: 100 each, Rfl: 11 .  latanoprost (XALATAN) 0.005 % ophthalmic  solution, , Disp: , Rfl: 0 .  metFORMIN (GLUCOPHAGE) 500 MG tablet, TAKE 1 TABLET BY MOUTH EVERY MORNING WITH BREAKFAST, Disp: 90 tablet, Rfl: 1 .  Multiple Vitamins-Minerals (CENTRUM SILVER PO), Take by mouth once., Disp: , Rfl:  .  ONETOUCH DELICA LANCETS FINE MISC, To check blood sugars once a day.  DX: E11.9, Disp: 100 each, Rfl: 1 .  zolpidem (AMBIEN) 10 MG tablet, TAKE 1 TABLET(10 MG) BY MOUTH AT BEDTIME AS NEEDED, Disp: 30 tablet, Rfl: 5 .  simvastatin (ZOCOR) 10 MG tablet, TAKE 1 TABLET(10 MG) BY MOUTH AT BEDTIME (Patient not taking: Reported on 10/16/2018), Disp: 90 tablet, Rfl: 3  Patient Care Team: Margaretann LovelessBurnette, Netanya Yazdani M, PA-C as PCP - General (Family Medicine) Silverio Layivard, Sandra, MD as Consulting Physician (Obstetrics and Gynecology) Lennon Alstromorley, Sarah, MD as Referring Physician (Dermatology)    Objective:    Vitals: BP (!) 145/77 (BP Location: Left Arm, Patient Position: Sitting, Cuff Size: Large)   Pulse 66   Temp 98.2 F (36.8 C) (Oral)   Resp 16   Ht 5\' 4"  (1.626 m)   Wt 185 lb (83.9 kg)   BMI 31.76 kg/m   Physical Exam Vitals signs reviewed.  Constitutional:      General: She is not in acute distress.    Appearance: Normal appearance. She is well-developed. She is obese. She is not ill-appearing or diaphoretic.  HENT:     Head: Normocephalic and atraumatic.     Right Ear: Tympanic membrane, ear canal and external ear normal.     Left Ear: Tympanic membrane, ear canal and external ear normal.     Nose: Nose normal.     Mouth/Throat:     Mouth: Mucous membranes are moist.     Pharynx: Oropharynx is clear. No oropharyngeal exudate.  Eyes:     General: No scleral icterus.       Right eye: No discharge.        Left eye: No discharge.     Extraocular Movements: Extraocular movements intact.     Conjunctiva/sclera: Conjunctivae normal.     Pupils: Pupils are equal, round, and reactive to light.  Neck:     Musculoskeletal: Normal range of motion and neck supple.      Thyroid: No thyromegaly.     Vascular: No carotid bruit or JVD.     Trachea: No tracheal deviation.  Cardiovascular:     Rate and Rhythm: Normal rate and regular rhythm.     Pulses: Normal pulses.     Heart sounds: Normal heart sounds. No murmur. No friction rub. No gallop.   Pulmonary:     Effort: Pulmonary effort is normal. No respiratory distress.     Breath sounds: Normal breath sounds. No wheezing or rales.  Chest:     Chest wall: No tenderness.  Abdominal:     General: Bowel sounds are normal. There is no distension.     Palpations: Abdomen is soft. There is no mass.     Tenderness: There is no abdominal tenderness. There is no guarding or rebound.  Musculoskeletal: Normal range of motion.        General: No tenderness.  Lymphadenopathy:     Cervical: No cervical adenopathy.  Skin:    General: Skin is warm and dry.     Capillary Refill: Capillary refill takes less than 2 seconds.     Findings: No rash.  Neurological:     General: No focal deficit present.     Mental Status: She is alert and oriented to person, place, and time. Mental status is at baseline.     Cranial Nerves: No cranial nerve deficit.     Motor: No weakness.     Gait: Gait normal.  Psychiatric:        Mood and Affect: Mood normal.        Behavior: Behavior normal.        Thought Content: Thought content normal.        Judgment: Judgment normal.    Diabetic Foot Exam - Simple   Simple Foot Form Diabetic Foot exam was performed with the following findings: Yes 10/16/2018 12:53 PM  Visual Inspection No deformities, no ulcerations, no other skin breakdown bilaterally: Yes Sensation Testing Intact to touch and monofilament testing bilaterally: Yes Pulse Check Posterior Tibialis and Dorsalis pulse intact bilaterally: Yes Comments     Activities of Daily Living In your present state of health, do you have any difficulty performing the following activities: 10/16/2018  Hearing? N  Vision? N   Difficulty concentrating or making decisions? N  Walking or climbing stairs? N  Dressing or bathing? N  Doing errands, shopping? N  Some recent data might be hidden    Fall Risk Assessment Fall Risk  10/16/2018 07/06/2017 06/16/2016 05/16/2015  Falls in the past year? 0 No No No     Depression Screen PHQ 2/9 Scores 10/16/2018 07/06/2017 06/16/2016 05/16/2015  PHQ - 2 Score 0 0 0 0    6CIT Screen 10/16/2018  What Year? 0 points  What month? 0 points  What time? 0 points  Count back from 20 2 points  Months in reverse 0 points  Repeat phrase 8 points  Total Score 10      Assessment & Plan:     Annual Wellness Visit  Reviewed patient's Family Medical History Reviewed and updated list of patient's medical providers Assessment of cognitive impairment was done Assessed patient's functional ability Established a written schedule for health screening services Health Risk Assessent Completed and Reviewed  Exercise Activities and Dietary recommendations Goals    . Weight (lb) < 180 lb (81.6 kg)     Starting in 2018, I will work to lose 10 lbs this year.        Immunization History  Administered Date(s) Administered  . Influenza, High Dose Seasonal PF 01/05/2016, 02/08/2018  . Influenza,inj,Quad PF,6+ Mos 05/16/2015  . Pneumococcal Conjugate-13 05/16/2015  . Pneumococcal Polysaccharide-23 01/08/2013  . Zoster 10/25/2012    Health Maintenance  Topic Date Due  . Janet BerlinETANUS/TDAP  10/04/1961  . OPHTHALMOLOGY EXAM  04/26/2017  . FOOT EXAM  06/16/2017  . HEMOGLOBIN A1C  08/10/2018  . INFLUENZA VACCINE  11/25/2018  . DEXA SCAN  Completed  . PNA vac Low Risk Adult  Completed     Discussed health benefits of physical activity, and encouraged her to engage in regular exercise appropriate for her age and condition.    1. Medicare annual wellness visit, subsequent Normal exam. Up to date on vaccinations.   2. Essential hypertension Stable. Diagnosis pulled for medication  refill. Continue current medical treatment plan. Will check labs as below and f/u pending results. - CBC w/Diff/Platelet - Comprehensive Metabolic Panel (CMET) - Lipid Profile - HgB A1c - atenolol-chlorthalidone (TENORETIC) 50-25 MG tablet; Take 1 tablet by mouth daily.  Dispense: 90 tablet; Refill: 3  3. Type 2 diabetes mellitus without complication, without long-term current use of insulin (HCC) Stable. Diagnosis pulled for medication refill. Continue current medical treatment plan. Will check labs as below and f/u pending results. - CBC w/Diff/Platelet - Comprehensive Metabolic Panel (CMET) - Lipid Profile - HgB A1c - metFORMIN (GLUCOPHAGE) 500 MG tablet; Take 1 tablet (500 mg total) by mouth daily with breakfast.  Dispense: 90 tablet; Refill: 3  4. Subclinical hypothyroidism Asymptomatic. Will check labs as below and f/u pending results. - CBC w/Diff/Platelet - TSH  5. Hypercholesteremia Stable. Diet controlled. Stopped Simvastatin and refuses other medications at this time. Will check labs as below and f/u pending results. - CBC w/Diff/Platelet - Comprehensive Metabolic Panel (CMET) - Lipid Profile - HgB A1c  ------------------------------------------------------------------------------------------------------------    Mar Daring, PA-C  [facility]

## 2018-10-17 ENCOUNTER — Telehealth: Payer: Self-pay | Admitting: Physician Assistant

## 2018-10-17 LAB — CBC WITH DIFFERENTIAL/PLATELET
Basophils Absolute: 0 10*3/uL (ref 0.0–0.2)
Basos: 1 %
EOS (ABSOLUTE): 0.1 10*3/uL (ref 0.0–0.4)
Eos: 3 %
Hematocrit: 44.5 % (ref 34.0–46.6)
Hemoglobin: 15.2 g/dL (ref 11.1–15.9)
Immature Grans (Abs): 0 10*3/uL (ref 0.0–0.1)
Immature Granulocytes: 0 %
Lymphocytes Absolute: 1.8 10*3/uL (ref 0.7–3.1)
Lymphs: 34 %
MCH: 31.7 pg (ref 26.6–33.0)
MCHC: 34.2 g/dL (ref 31.5–35.7)
MCV: 93 fL (ref 79–97)
Monocytes Absolute: 0.5 10*3/uL (ref 0.1–0.9)
Monocytes: 9 %
Neutrophils Absolute: 2.9 10*3/uL (ref 1.4–7.0)
Neutrophils: 53 %
Platelets: 254 10*3/uL (ref 150–450)
RBC: 4.8 x10E6/uL (ref 3.77–5.28)
RDW: 12.8 % (ref 11.7–15.4)
WBC: 5.3 10*3/uL (ref 3.4–10.8)

## 2018-10-17 LAB — LIPID PANEL
Chol/HDL Ratio: 5.3 ratio — ABNORMAL HIGH (ref 0.0–4.4)
Cholesterol, Total: 266 mg/dL — ABNORMAL HIGH (ref 100–199)
HDL: 50 mg/dL (ref >39)
LDL Calculated: 159 mg/dL — ABNORMAL HIGH (ref 0–99)
Triglycerides: 285 mg/dL — ABNORMAL HIGH (ref 0–149)
VLDL Cholesterol Cal: 57 mg/dL — ABNORMAL HIGH (ref 5–40)

## 2018-10-17 LAB — HEMOGLOBIN A1C
Est. average glucose Bld gHb Est-mCnc: 151 mg/dL
Hgb A1c MFr Bld: 6.9 % — ABNORMAL HIGH (ref 4.8–5.6)

## 2018-10-17 LAB — COMPREHENSIVE METABOLIC PANEL
ALT: 17 IU/L (ref 0–32)
AST: 18 IU/L (ref 0–40)
Albumin/Globulin Ratio: 1.9 (ref 1.2–2.2)
Albumin: 4.4 g/dL (ref 3.7–4.7)
Alkaline Phosphatase: 58 IU/L (ref 39–117)
BUN/Creatinine Ratio: 27 (ref 12–28)
BUN: 20 mg/dL (ref 8–27)
Bilirubin Total: 0.3 mg/dL (ref 0.0–1.2)
CO2: 25 mmol/L (ref 20–29)
Calcium: 10.8 mg/dL — ABNORMAL HIGH (ref 8.7–10.3)
Chloride: 94 mmol/L — ABNORMAL LOW (ref 96–106)
Creatinine, Ser: 0.73 mg/dL (ref 0.57–1.00)
GFR calc Af Amer: 93 mL/min/{1.73_m2} (ref >59)
GFR calc non Af Amer: 80 mL/min/{1.73_m2} (ref >59)
Globulin, Total: 2.3 g/dL (ref 1.5–4.5)
Glucose: 121 mg/dL — ABNORMAL HIGH (ref 65–99)
Potassium: 3.8 mmol/L (ref 3.5–5.2)
Sodium: 139 mmol/L (ref 134–144)
Total Protein: 6.7 g/dL (ref 6.0–8.5)

## 2018-10-17 LAB — TSH: TSH: 3.65 u[IU]/mL (ref 0.450–4.500)

## 2018-10-17 NOTE — Telephone Encounter (Signed)
Pt returned call. This message was relayed. Pt does not want to try a new medication at this time. Pt understood and did not have any questions. TF

## 2018-10-17 NOTE — Telephone Encounter (Signed)
No answer. Left v/m to call us. Called to relay this message. TF

## 2018-10-17 NOTE — Telephone Encounter (Signed)
-----   Message from Mar Daring, [place] sent at 10/17/2018  1:37 PM EDT ----- Blood count is normal. Kidney and liver function is normal. Calcium is borderline high. I would stop any calcium supplement at this time. Thyroid is normal. Cholesterol is elevated. I know simvastatin caused fatigue. Would you be willing to try another cholesterol medication? A1c also up from 6.7 to 6.9. This is still below goal of 7. Continue metformin 500mg  daily.

## 2018-10-17 NOTE — Telephone Encounter (Signed)
-----   Message from Jennifer M Burnette, PA-C sent at 10/17/2018  1:37 PM EDT ----- °Blood count is normal. Kidney and liver function is normal. Calcium is borderline high. I would stop any calcium supplement at this time. Thyroid is normal. Cholesterol is elevated. I know simvastatin caused fatigue. Would you be willing to try another cholesterol medication? A1c also up from 6.7 to 6.9. This is still below goal of 7. Continue metformin 500mg daily. °

## 2018-11-20 DIAGNOSIS — Z13820 Encounter for screening for osteoporosis: Secondary | ICD-10-CM | POA: Diagnosis not present

## 2018-11-20 DIAGNOSIS — Z1231 Encounter for screening mammogram for malignant neoplasm of breast: Secondary | ICD-10-CM | POA: Diagnosis not present

## 2018-11-20 DIAGNOSIS — Z1211 Encounter for screening for malignant neoplasm of colon: Secondary | ICD-10-CM | POA: Diagnosis not present

## 2018-11-20 DIAGNOSIS — Z01419 Encounter for gynecological examination (general) (routine) without abnormal findings: Secondary | ICD-10-CM | POA: Diagnosis not present

## 2018-11-23 ENCOUNTER — Other Ambulatory Visit: Payer: Self-pay | Admitting: Physician Assistant

## 2018-11-23 DIAGNOSIS — I10 Essential (primary) hypertension: Secondary | ICD-10-CM

## 2019-01-25 DIAGNOSIS — L304 Erythema intertrigo: Secondary | ICD-10-CM | POA: Diagnosis not present

## 2019-01-25 DIAGNOSIS — D229 Melanocytic nevi, unspecified: Secondary | ICD-10-CM | POA: Diagnosis not present

## 2019-01-25 DIAGNOSIS — L218 Other seborrheic dermatitis: Secondary | ICD-10-CM | POA: Diagnosis not present

## 2019-01-25 DIAGNOSIS — L821 Other seborrheic keratosis: Secondary | ICD-10-CM | POA: Diagnosis not present

## 2019-02-06 DIAGNOSIS — H40153 Residual stage of open-angle glaucoma, bilateral: Secondary | ICD-10-CM | POA: Diagnosis not present

## 2019-03-06 ENCOUNTER — Other Ambulatory Visit: Payer: Self-pay

## 2019-03-06 ENCOUNTER — Ambulatory Visit (INDEPENDENT_AMBULATORY_CARE_PROVIDER_SITE_OTHER): Payer: Medicare Other

## 2019-03-06 DIAGNOSIS — Z23 Encounter for immunization: Secondary | ICD-10-CM | POA: Diagnosis not present

## 2019-03-28 ENCOUNTER — Ambulatory Visit: Payer: Self-pay | Admitting: Physician Assistant

## 2019-03-30 NOTE — Progress Notes (Signed)
Patient: Nicole Lynn Female    DOB: [birth year]   78 y.o.   MRN: [id number] Visit Date: 04/02/2019  Today's Provider: [name], PA-C   Chief Complaint  Patient presents with  . Follow-up    DM,HTN,HLD   Subjective:     HPI   Diabetes Mellitus Type II, Follow-up:   Lab Results  Component Value Date   HGBA1C 6.9 (H) 10/16/2018   HGBA1C 6.7 (H) 02/08/2018   HGBA1C 6.7 (H) 07/06/2017   Last seen for diabetes 6 months ago.  Management since then includes none. She reports excellent compliance with treatment. She is not having side effects.  Current symptoms include none and dry mouth and have been stable. Home blood sugar records: fasting range: 130-150  Episodes of hypoglycemia? no   Current Insulin Regimen: none Most Recent Eye Exam: a month ago- Dr.Bell Weight trend: stable Current diet: well balanced Current exercise: none  ------------------------------------------------------------------------   Hypertension, follow-up:  BP Readings from Last 3 Encounters:  04/02/19 136/77  10/16/18 (!) 145/77  02/08/18 120/70    She was last seen for hypertension 6 months ago.  BP at that visit was 145/77. Management since that visit includes none.She reports excellent compliance with treatment. She is not having side effects.  She is adherent to low salt diet.   Outside blood pressures are n/a. She is experiencing none. Some fatigue. Patient denies chest pain, chest pressure/discomfort, exertional chest pressure/discomfort, irregular heart beat, lower extremity edema, near-syncope and palpitations.   Cardiovascular risk factors include advanced age (older than 14 for men, 91 for women), diabetes mellitus, dyslipidemia and hypertension.  ------------------------------------------------------------------------    Lipid/Cholesterol, Follow-up:   Last seen for this 6 months ago.  Management since that visit includes none.Patient did not want  to try new medication.  Last Lipid Panel:    Component Value Date/Time   CHOL 266 (H) 10/16/2018 1108   TRIG 285 (H) 10/16/2018 1108   HDL 50 10/16/2018 1108   CHOLHDL 5.3 (H) 10/16/2018 1108   LDLCALC 159 (H) 10/16/2018 1108    Wt Readings from Last 3 Encounters:  04/02/19 187 lb (84.8 kg)  10/16/18 185 lb (83.9 kg)  02/08/18 186 lb 12.8 oz (84.7 kg)    ------------------------------------------------------------------------   Allergies  Allergen Reactions  . Latex      Current Outpatient Medications:  .  atenolol-chlorthalidone (TENORETIC) 50-25 MG tablet, TAKE 1 TABLET BY MOUTH EVERY DAY, Disp: 90 tablet, Rfl: 3 .  fish oil-omega-3 fatty acids 1000 MG capsule, Take 2 g by mouth daily., Disp: , Rfl:  .  glucose blood (ONETOUCH VERIO) test strip, Use to check blood sugar once daily, Disp: 100 each, Rfl: 11 .  latanoprost (XALATAN) 0.005 % ophthalmic solution, , Disp: , Rfl: 0 .  metFORMIN (GLUCOPHAGE) 500 MG tablet, Take 1 tablet (500 mg total) by mouth daily with breakfast., Disp: 90 tablet, Rfl: 3 .  Multiple Vitamins-Minerals (CENTRUM SILVER PO), Take by mouth once., Disp: , Rfl:  .  ONETOUCH DELICA LANCETS FINE MISC, To check blood sugars once a day.  DX: E11.9, Disp: 100 each, Rfl: 1 .  zolpidem (AMBIEN) 10 MG tablet, TAKE 1 TABLET(10 MG) BY MOUTH AT BEDTIME AS NEEDED, Disp: 30 tablet, Rfl: 5  Review of Systems  Constitutional: Negative.   HENT: Negative.   Respiratory: Negative.   Cardiovascular: Negative.   Neurological: Negative.   Psychiatric/Behavioral: Negative.     Social History   Tobacco Use  .  Smoking status: Never Smoker  . Smokeless tobacco: Never Used  Substance Use Topics  . Alcohol use: Yes    Alcohol/week: 3.0 standard drinks    Types: 3 Glasses of wine per week      Objective:   BP 136/77 (BP Location: Left Arm, Patient Position: Sitting, Cuff Size: Normal)   Pulse 76   Temp (!) 96.9 F (36.1 C) (Temporal)   Resp 16   Wt 187 lb  (84.8 kg)   BMI 32.10 kg/m  Vitals:   04/02/19 1334  BP: 136/77  Pulse: 76  Resp: 16  Temp: (!) 96.9 F (36.1 C)  TempSrc: Temporal  Weight: 187 lb (84.8 kg)  Body mass index is 32.1 kg/m.   Physical Exam Vitals signs reviewed.  Constitutional:      General: She is not in acute distress.    Appearance: Normal appearance. She is well-developed. She is obese. She is not ill-appearing or diaphoretic.  HENT:     Head: Normocephalic and atraumatic.  Eyes:     General: No scleral icterus.    Extraocular Movements: Extraocular movements intact.  Neck:     Musculoskeletal: Normal range of motion and neck supple.  Cardiovascular:     Rate and Rhythm: Normal rate and regular rhythm.     Heart sounds: Normal heart sounds. No murmur. No friction rub. No gallop.   Pulmonary:     Effort: Pulmonary effort is normal. No respiratory distress.     Breath sounds: Normal breath sounds. No wheezing or rales.  Neurological:     Mental Status: She is alert.      No results found for any visits on 04/02/19.     Assessment & Plan    1. Yeast infection Stable. Diagnosis pulled for medication refill. Continue current medical treatment plan. - fluconazole (DIFLUCAN) 150 MG tablet; Take 1 tablet (150 mg total) by mouth once for 1 dose.  Dispense: 1 tablet; Refill: 3  2. Essential hypertension Stable. Continue atenolol-chlorthalidone 50-25mg . Will check labs as below and f/u pending results. Return in 6 months for her AWV.  - CBC w/Diff - Comprehensive Metabolic Panel (CMET) - HgB A1c - Lipid Profile  3. Type 2 diabetes mellitus without complication, without long-term current use of insulin (HCC) Stable. Continue metformin 500mg  daily. Will check labs as below and f/u pending results. - CBC w/Diff - Comprehensive Metabolic Panel (CMET) - HgB A1c - Lipid Profile  4. Hypercholesteremia Stable. Diet controlled. Will check labs as below and f/u pending results. - CBC w/Diff -  Comprehensive Metabolic Panel (CMET) - HgB A1c - Lipid Profile     Mar Daring, PA-C  [facility]

## 2019-04-02 ENCOUNTER — Other Ambulatory Visit: Payer: Self-pay

## 2019-04-02 ENCOUNTER — Encounter: Payer: Self-pay | Admitting: Physician Assistant

## 2019-04-02 ENCOUNTER — Ambulatory Visit (INDEPENDENT_AMBULATORY_CARE_PROVIDER_SITE_OTHER): Payer: Medicare Other | Admitting: Physician Assistant

## 2019-04-02 VITALS — BP 136/77 | HR 76 | Temp 96.9°F | Resp 16 | Wt 187.0 lb

## 2019-04-02 DIAGNOSIS — E119 Type 2 diabetes mellitus without complications: Secondary | ICD-10-CM

## 2019-04-02 DIAGNOSIS — I10 Essential (primary) hypertension: Secondary | ICD-10-CM | POA: Diagnosis not present

## 2019-04-02 DIAGNOSIS — B379 Candidiasis, unspecified: Secondary | ICD-10-CM | POA: Diagnosis not present

## 2019-04-02 DIAGNOSIS — E7800 Pure hypercholesterolemia, unspecified: Secondary | ICD-10-CM | POA: Diagnosis not present

## 2019-04-02 MED ORDER — FLUCONAZOLE 150 MG PO TABS: 150.0000 mg | ORAL_TABLET | Freq: Once | ORAL | 3 refills | Status: AC

## 2019-04-02 NOTE — Patient Instructions (Signed)

## 2019-04-03 LAB — CBC WITH DIFFERENTIAL/PLATELET
Basophils Absolute: 0.1 10*3/uL (ref 0.0–0.2)
Basos: 1 %
EOS (ABSOLUTE): 0.1 10*3/uL (ref 0.0–0.4)
Eos: 2 %
Hematocrit: 43.3 % (ref 34.0–46.6)
Hemoglobin: 14.7 g/dL (ref 11.1–15.9)
Immature Grans (Abs): 0 10*3/uL (ref 0.0–0.1)
Immature Granulocytes: 0 %
Lymphocytes Absolute: 1.5 10*3/uL (ref 0.7–3.1)
Lymphs: 27 %
MCH: 31.7 pg (ref 26.6–33.0)
MCHC: 33.9 g/dL (ref 31.5–35.7)
MCV: 94 fL (ref 79–97)
Monocytes Absolute: 0.4 10*3/uL (ref 0.1–0.9)
Monocytes: 8 %
Neutrophils Absolute: 3.3 10*3/uL (ref 1.4–7.0)
Neutrophils: 62 %
Platelets: 255 10*3/uL (ref 150–450)
RBC: 4.63 x10E6/uL (ref 3.77–5.28)
RDW: 12.7 % (ref 11.7–15.4)
WBC: 5.4 10*3/uL (ref 3.4–10.8)

## 2019-04-03 LAB — COMPREHENSIVE METABOLIC PANEL
ALT: 19 IU/L (ref 0–32)
AST: 17 IU/L (ref 0–40)
Albumin/Globulin Ratio: 1.9 (ref 1.2–2.2)
Albumin: 4.4 g/dL (ref 3.7–4.7)
Alkaline Phosphatase: 70 IU/L (ref 39–117)
BUN/Creatinine Ratio: 31 — ABNORMAL HIGH (ref 12–28)
BUN: 24 mg/dL (ref 8–27)
Bilirubin Total: 0.2 mg/dL (ref 0.0–1.2)
CO2: 26 mmol/L (ref 20–29)
Calcium: 10.6 mg/dL — ABNORMAL HIGH (ref 8.7–10.3)
Chloride: 100 mmol/L (ref 96–106)
Creatinine, Ser: 0.78 mg/dL (ref 0.57–1.00)
GFR calc Af Amer: 85 mL/min/{1.73_m2} (ref >59)
GFR calc non Af Amer: 74 mL/min/{1.73_m2} (ref >59)
Globulin, Total: 2.3 g/dL (ref 1.5–4.5)
Glucose: 162 mg/dL — ABNORMAL HIGH (ref 65–99)
Potassium: 3.3 mmol/L — ABNORMAL LOW (ref 3.5–5.2)
Sodium: 142 mmol/L (ref 134–144)
Total Protein: 6.7 g/dL (ref 6.0–8.5)

## 2019-04-03 LAB — LIPID PANEL
Chol/HDL Ratio: 5.1 ratio — ABNORMAL HIGH (ref 0.0–4.4)
Cholesterol, Total: 282 mg/dL — ABNORMAL HIGH (ref 100–199)
HDL: 55 mg/dL (ref >39)
LDL Chol Calc (NIH): 157 mg/dL — ABNORMAL HIGH (ref 0–99)
Triglycerides: 370 mg/dL — ABNORMAL HIGH (ref 0–149)
VLDL Cholesterol Cal: 70 mg/dL — ABNORMAL HIGH (ref 5–40)

## 2019-04-03 LAB — HEMOGLOBIN A1C
Est. average glucose Bld gHb Est-mCnc: 143 mg/dL
Hgb A1c MFr Bld: 6.6 % — ABNORMAL HIGH (ref 4.8–5.6)

## 2019-04-04 ENCOUNTER — Telehealth: Payer: Self-pay | Admitting: Physician Assistant

## 2019-04-04 ENCOUNTER — Telehealth: Payer: Self-pay

## 2019-04-04 DIAGNOSIS — E7800 Pure hypercholesterolemia, unspecified: Secondary | ICD-10-CM

## 2019-04-04 MED ORDER — ROSUVASTATIN CALCIUM 5 MG PO TABS: 5.0000 mg | ORAL_TABLET | Freq: Every day | ORAL | 3 refills | Status: DC

## 2019-04-04 NOTE — Telephone Encounter (Signed)
Patient returned call and she was read note from [facility] 04/03/2019. Patient agrees to try medication for her cholesterol. She states last one made her feel "down in the dumps". Please start with smallest dose.

## 2019-04-04 NOTE — Telephone Encounter (Signed)
Crestor 5mg  sent in for patient.

## 2019-04-04 NOTE — Telephone Encounter (Signed)
-----   Message from Mar Daring, [place] sent at 04/03/2019  5:15 PM EST ----- Blood count is normal. Kidney and liver function is normal. Potassium is borderline low. Make sure to take your multivitamin daily. Also increasing potassium rich foods such as sweet potatoes and leafy greens can help. Calcium is borderline high. No need for extra calcium supplement. Sodium is normal. A1c is improved from 6.9 to 6.6. Continue metformin 500mg  daily. Cholesterol has increased compared to last year, but this was not a fasting lab. Continue working on healthy lifestyle modifications. I would again recommend a cholesterol lowering medication. If agreeable I will send in.

## 2019-04-04 NOTE — Addendum Note (Signed)
Addended by: Mar Daring on: 04/04/2019 04:48 PM   Modules accepted: Orders

## 2019-04-04 NOTE — Telephone Encounter (Signed)
Patient advised as below.  

## 2019-04-04 NOTE — Telephone Encounter (Signed)
Please see other note

## 2019-04-04 NOTE — Telephone Encounter (Signed)
LMTCB OK for [facility] triage nurse to give results.

## 2019-07-02 DIAGNOSIS — H40153 Residual stage of open-angle glaucoma, bilateral: Secondary | ICD-10-CM | POA: Diagnosis not present

## 2019-07-11 ENCOUNTER — Other Ambulatory Visit: Payer: Self-pay | Admitting: Physician Assistant

## 2019-07-11 DIAGNOSIS — E7800 Pure hypercholesterolemia, unspecified: Secondary | ICD-10-CM

## 2019-07-11 NOTE — Telephone Encounter (Signed)
Requested Prescriptions  Pending Prescriptions Disp Refills  . rosuvastatin (CRESTOR) 5 MG tablet [Pharmacy Med Name: ROSUVASTATIN 5MG  TABLETS] 90 tablet 3    Sig: TAKE 1 TABLET(5 MG) BY MOUTH DAILY     Cardiovascular:  Antilipid - Statins Failed - 07/11/2019 10:00 AM      Failed - Total Cholesterol in normal range and within 360 days    Cholesterol, Total  Date Value Ref Range Status  04/02/2019 282 (H) 100 - 199 mg/dL Final         Failed - LDL in normal range and within 360 days    LDL Chol Calc (NIH)  Date Value Ref Range Status  04/02/2019 157 (H) 0 - 99 mg/dL Final         Failed - Triglycerides in normal range and within 360 days    Triglycerides  Date Value Ref Range Status  04/02/2019 370 (H) 0 - 149 mg/dL Final         Passed - HDL in normal range and within 360 days    HDL  Date Value Ref Range Status  04/02/2019 55 >39 mg/dL Final         Passed - Patient is not pregnant      Passed - Valid encounter within last 12 months    Recent Outpatient Visits          3 months ago Yeast infection   14/10/2018, [facility], [place] [place]   1 year ago Essential hypertension   [facility] [place], [place], [place]   2 years ago [place] annual wellness visit, subsequent   [facility] [place], [place], [place]   2 years ago Viral upper respiratory tract infection   [facility] [place], [place], [place]   2 years ago Trigger finger of right thumb   [facility] [place], [place], [place]

## 2019-08-18 ENCOUNTER — Other Ambulatory Visit: Payer: Self-pay | Admitting: Physician Assistant

## 2019-08-18 DIAGNOSIS — E7800 Pure hypercholesterolemia, unspecified: Secondary | ICD-10-CM

## 2019-10-17 ENCOUNTER — Telehealth: Payer: Self-pay | Admitting: Physician Assistant

## 2019-10-17 NOTE — Telephone Encounter (Signed)
Copied from CRM [id number]. Topic: Medicare AWV >> Oct 17, 2019 11:11 AM Claudette Laws R wrote: Reason for CRM: Called patient to schedule Medicare Annual Wellness Visit with Nurse Health Advisor.   If patient returns call, please schedule for any date.  Last AWV completed: 10/16/2018  Appointment length should be 40 minutes  Thank you,  Judeth Cornfield [phone]

## 2019-10-18 DIAGNOSIS — L219 Seborrheic dermatitis, unspecified: Secondary | ICD-10-CM | POA: Diagnosis not present

## 2019-10-18 DIAGNOSIS — B353 Tinea pedis: Secondary | ICD-10-CM | POA: Diagnosis not present

## 2019-10-18 DIAGNOSIS — B354 Tinea corporis: Secondary | ICD-10-CM | POA: Diagnosis not present

## 2019-10-18 DIAGNOSIS — L814 Other melanin hyperpigmentation: Secondary | ICD-10-CM | POA: Diagnosis not present

## 2019-10-31 ENCOUNTER — Ambulatory Visit: Payer: Medicare Other | Admitting: Physician Assistant

## 2019-10-31 ENCOUNTER — Other Ambulatory Visit: Payer: Self-pay

## 2019-10-31 ENCOUNTER — Encounter: Payer: Self-pay | Admitting: Physician Assistant

## 2019-10-31 VITALS — BP 124/76 | HR 65 | Temp 97.0°F | Resp 16 | Wt 188.6 lb

## 2019-10-31 DIAGNOSIS — F5101 Primary insomnia: Secondary | ICD-10-CM

## 2019-10-31 DIAGNOSIS — E039 Hypothyroidism, unspecified: Secondary | ICD-10-CM

## 2019-10-31 DIAGNOSIS — E038 Other specified hypothyroidism: Secondary | ICD-10-CM

## 2019-10-31 DIAGNOSIS — E119 Type 2 diabetes mellitus without complications: Secondary | ICD-10-CM

## 2019-10-31 DIAGNOSIS — I10 Essential (primary) hypertension: Secondary | ICD-10-CM

## 2019-10-31 DIAGNOSIS — E7800 Pure hypercholesterolemia, unspecified: Secondary | ICD-10-CM

## 2019-10-31 DIAGNOSIS — R5383 Other fatigue: Secondary | ICD-10-CM

## 2019-10-31 LAB — POCT GLYCOSYLATED HEMOGLOBIN (HGB A1C)
Est. average glucose Bld gHb Est-mCnc: 160
Hemoglobin A1C: 7.2 % — AB (ref 4.0–5.6)

## 2019-10-31 MED ORDER — ONETOUCH VERIO VI STRP: ORAL_STRIP | 11 refills | Status: DC

## 2019-10-31 MED ORDER — ROSUVASTATIN CALCIUM 5 MG PO TABS: ORAL_TABLET | ORAL | 3 refills | Status: DC

## 2019-10-31 MED ORDER — ATENOLOL-CHLORTHALIDONE 50-25 MG PO TABS: 1.0000 | ORAL_TABLET | Freq: Every day | ORAL | 3 refills | Status: DC

## 2019-10-31 MED ORDER — METFORMIN HCL 500 MG PO TABS: 500.0000 mg | ORAL_TABLET | Freq: Every day | ORAL | 3 refills | Status: DC

## 2019-10-31 NOTE — Assessment & Plan Note (Addendum)
Well Controlled .Continue current medication Rosuvastatin 5mg  Will check labs as below and f/u pending results.

## 2019-10-31 NOTE — Progress Notes (Signed)
I,Joseline E Rosas,acting as a scribe for [employer], PA-C.,have documented all relevant documentation on the behalf of [name], PA-C,as directed by  [name], PA-C while in the presence of [name], [place].   Established patient visit   Patient: Nicole Lynn   DOB: 06-08-1940   79 y.o. Female  MRN: [id number] Visit Date: 10/31/2019  Today's healthcare provider: [name], PA-C   Chief Complaint  Patient presents with  . Follow-up   Subjective    HPI  Diabetes Mellitus Type II, follow-up  Lab Results  Component Value Date   HGBA1C 7.2 (A) 10/31/2019   HGBA1C 6.6 (H) 04/02/2019   HGBA1C 6.9 (H) 10/16/2018   Last seen for diabetes 6 months ago.  Management since then includes continuing the same treatment.metformin 500mg  daily She reports excellent compliance with treatment. She is not having side effects.   Home blood sugar records: not being checked  Episodes of hypoglycemia? No not being checked   Current insulin regiment: none Most Recent Eye Exam: Scheduled for next week  --------------------------------------------------------------------------------------------------- Hypertension, follow-up  BP Readings from Last 3 Encounters:  10/31/19 124/76  04/02/19 136/77  10/16/18 (!) 145/77   Wt Readings from Last 3 Encounters:  10/31/19 188 lb 9.6 oz (85.5 kg)  04/02/19 187 lb (84.8 kg)  10/16/18 185 lb (83.9 kg)     She was last seen for hypertension 6 months ago.  BP at that visit was 136/77 Management since that visit includes Continue atenolol-chlorthalidone 50-25mg  She reports excellent compliance with treatment. She is not having side effects.  She is exercisingsome She is not adherent to low salt diet.   Outside blood pressures are not being checked.  She does not smoke.  --------------------------------------------------------------------------------------------------- Lipid/Cholesterol,  follow-up  Last Lipid Panel: Lab Results  Component Value Date   CHOL 282 (H) 04/02/2019   LDLCALC 157 (H) 04/02/2019   HDL 55 04/02/2019   TRIG 370 (H) 04/02/2019    She was last seen for this 6 months ago.  Management since that visit includes Rosuvastatin 5mg   She reports excellent compliance with treatment. She is not having side effects. none  Symptoms: No appetite changes No foot ulcerations  No chest pain No chest pressure/discomfort  No dyspnea No orthopnea  No fatigue No lower extremity edema  No palpitations No paroxysmal nocturnal dyspnea  No nausea No numbness or tingling of extremity  No polydipsia No polyuria  No speech difficulty No syncope   She is following a Regular diet. Current exercise: walking  Last metabolic panel Lab Results  Component Value Date   GLUCOSE 162 (H) 04/02/2019   NA 142 04/02/2019   K 3.3 (L) 04/02/2019   BUN 24 04/02/2019   CREATININE 0.78 04/02/2019   GFRNONAA 74 04/02/2019   GFRAA 85 04/02/2019   CALCIUM 10.6 (H) 04/02/2019   AST 17 04/02/2019   ALT 19 04/02/2019   The 10-year ASCVD risk score 14/10/2018 DC Jr., et al., 2013) is: 41.8%  --------------------------------------------------------------------------------------------------- Subclinical hypothyroidism: Asymptomatic  Patient Active Problem List   Diagnosis Date Noted  . Diabetes mellitus (HCC) 06/06/2015  . Anxiety 05/16/2015  . Claustrophobia 05/16/2015  . Fatigue 05/16/2015  . Hypercholesteremia 05/16/2015  . Adiposity 05/16/2015  . Gonalgia 05/16/2015  . Subclinical hypothyroidism 05/16/2015  . History of recurrent UTIs 05/16/2015  . Insomnia 02/28/2015  . Varicose veins of leg with complications 02/04/2014  . Menopausal state   . HTN (hypertension)   . OVERWEIGHT/OBESITY  10/17/2008  . Chest pain 10/17/2008   Past Medical History:  Diagnosis Date  . Anxiety   . Chest pain, unspecified   . HTN (hypertension)   . Menopausal state   .  Overweight(278.02)        Medications: Outpatient Medications Prior to Visit  Medication Sig  . fish oil-omega-3 fatty acids 1000 MG capsule Take 2 g by mouth daily.  Marland Kitchen latanoprost (XALATAN) 0.005 % ophthalmic solution   . Multiple Vitamins-Minerals (CENTRUM SILVER PO) Take by mouth once.  Letta Pate DELICA LANCETS FINE MISC To check blood sugars once a day.  DX: E11.9  . zolpidem (AMBIEN) 10 MG tablet TAKE 1 TABLET(10 MG) BY MOUTH AT BEDTIME AS NEEDED  . [DISCONTINUED] atenolol-chlorthalidone (TENORETIC) 50-25 MG tablet TAKE 1 TABLET BY MOUTH EVERY DAY  . [DISCONTINUED] glucose blood (ONETOUCH VERIO) test strip Use to check blood sugar once daily  . [DISCONTINUED] metFORMIN (GLUCOPHAGE) 500 MG tablet Take 1 tablet (500 mg total) by mouth daily with breakfast.  . [DISCONTINUED] rosuvastatin (CRESTOR) 5 MG tablet TAKE 1 TABLET(5 MG) BY MOUTH DAILY   No facility-administered medications prior to visit.    Review of Systems  Constitutional: Positive for fatigue.  Eyes: Negative for visual disturbance.  Respiratory: Positive for shortness of breath ("some"reports this is not knew it has been going on for 2 yrs.). Negative for chest tightness and wheezing.   Cardiovascular: Negative for chest pain, palpitations and leg swelling.  Neurological: Negative for dizziness, weakness, light-headedness and headaches.    Last CBC Lab Results  Component Value Date   WBC 5.4 04/02/2019   HGB 14.7 04/02/2019   HCT 43.3 04/02/2019   MCV 94 04/02/2019   MCH 31.7 04/02/2019   RDW 12.7 04/02/2019   PLT 255 04/02/2019   Last metabolic panel Lab Results  Component Value Date   GLUCOSE 162 (H) 04/02/2019   NA 142 04/02/2019   K 3.3 (L) 04/02/2019   CL 100 04/02/2019   CO2 26 04/02/2019   BUN 24 04/02/2019   CREATININE 0.78 04/02/2019   GFRNONAA 74 04/02/2019   GFRAA 85 04/02/2019   CALCIUM 10.6 (H) 04/02/2019   PROT 6.7 04/02/2019   ALBUMIN 4.4 04/02/2019   LABGLOB 2.3 04/02/2019    AGRATIO 1.9 04/02/2019   BILITOT 0.2 04/02/2019   ALKPHOS 70 04/02/2019   AST 17 04/02/2019   ALT 19 04/02/2019   ANIONGAP 7 06/21/2013      Objective    BP 124/76 (BP Location: Left Arm, Patient Position: Sitting, Cuff Size: Large)   Pulse 65   Temp (!) 97 F (36.1 C) (Temporal)   Resp 16   Wt 188 lb 9.6 oz (85.5 kg)   BMI 32.37 kg/m  BP Readings from Last 3 Encounters:  10/31/19 124/76  04/02/19 136/77  10/16/18 (!) 145/77   Wt Readings from Last 3 Encounters:  10/31/19 188 lb 9.6 oz (85.5 kg)  04/02/19 187 lb (84.8 kg)  10/16/18 185 lb (83.9 kg)      Physical Exam Constitutional:      Appearance: Normal appearance. She is obese.  HENT:     Right Ear: Tympanic membrane and ear canal normal.     Left Ear: Tympanic membrane and ear canal normal.  Cardiovascular:     Rate and Rhythm: Normal rate and regular rhythm.     Pulses: Normal pulses.     Heart sounds: Normal heart sounds. No murmur heard.   Pulmonary:     Effort: Pulmonary effort  is normal.     Breath sounds: Normal breath sounds. No decreased breath sounds, wheezing, rhonchi or rales.  Musculoskeletal:     Right lower leg: No edema.     Left lower leg: No edema.     Right foot: Normal.     Left foot: Normal.  Skin:    General: Skin is warm.  Neurological:     Mental Status: She is alert and oriented to person, place, and time.  Psychiatric:        Mood and Affect: Mood normal.        Behavior: Behavior normal.     Diabetic Foot Exam - Simple   Simple Foot Form Diabetic Foot exam was performed with the following findings: Yes 10/31/2019  3:00 PM  Visual Inspection No deformities, no ulcerations, no other skin breakdown bilaterally: Yes Sensation Testing Intact to touch and monofilament testing bilaterally: Yes Pulse Check Posterior Tibialis and Dorsalis pulse intact bilaterally: Yes Comments      Results for orders placed or performed in visit on 10/31/19  POCT glycosylated hemoglobin (Hb  A1C)  Result Value Ref Range   Hemoglobin A1C 7.2 (A) 4.0 - 5.6 %   Est. average glucose Bld gHb Est-mCnc 160     Assessment & Plan     Problem List Items Addressed This Visit      Cardiovascular and Mediastinum   HTN (hypertension)    Well controlled Continue current medications Recheck metabolic panel       Relevant Medications   rosuvastatin (CRESTOR) 5 MG tablet   atenolol-chlorthalidone (TENORETIC) 50-25 MG tablet   Other Relevant Orders   CBC with Differential/Platelet   Comprehensive metabolic panel     Endocrine   Subclinical hypothyroidism    Asymptomatic. Will check labs as below and f/u pending results      Relevant Orders   CBC with Differential/Platelet   TSH   Diabetes mellitus (HCC) - Primary    A1C elevated today, up from 6.6 to 7.2 Continue current medications UTD on vaccines Eye exam Scheduled for next week On Statin Discussed diet and exercise       Relevant Medications   rosuvastatin (CRESTOR) 5 MG tablet   metFORMIN (GLUCOPHAGE) 500 MG tablet   atenolol-chlorthalidone (TENORETIC) 50-25 MG tablet   glucose blood (ONETOUCH VERIO) test strip   Other Relevant Orders   POCT glycosylated hemoglobin (Hb A1C) (Completed)   CBC with Differential/Platelet   Comprehensive metabolic panel     Other   Insomnia   Fatigue    Will check labs as below and f/u pending results.      Relevant Orders   CBC with Differential/Platelet   B12 and Folate Panel   Hypercholesteremia    Well Controlled .Continue current medication Rosuvastatin 5mg  Will check labs as below and f/u pending results.      Relevant Medications   rosuvastatin (CRESTOR) 5 MG tablet   atenolol-chlorthalidone (TENORETIC) 50-25 MG tablet    Other Visit Diagnoses    Hypercholesterolemia       Relevant Medications   rosuvastatin (CRESTOR) 5 MG tablet   atenolol-chlorthalidone (TENORETIC) 50-25 MG tablet   Other Relevant Orders   CBC with Differential/Platelet   Comprehensive  metabolic panel   Lipid panel      Return in about 6 months (around 05/02/2020) for T2DM.      06/30/2020, PA-C, have reviewed all documentation for this visit. The documentation on 10/31/19 for the exam, diagnosis, procedures, and orders  are all accurate and complete.   Reine JustJennifer M Saphronia Ozdemir, PA-C  [facility] [phone] (phone) [phone] (fax)  [facility]

## 2019-10-31 NOTE — Assessment & Plan Note (Signed)
Well controlled Continue current medications Recheck metabolic panel 

## 2019-10-31 NOTE — Assessment & Plan Note (Signed)
A1C elevated today, up from 6.6 to 7.2 Continue current medications UTD on vaccines Eye exam Scheduled for next week On Statin Discussed diet and exercise

## 2019-10-31 NOTE — Assessment & Plan Note (Signed)
Will check labs as below and f/u pending results. °

## 2019-10-31 NOTE — Assessment & Plan Note (Signed)
Asymptomatic. Will check labs as below and f/u pending results

## 2019-10-31 NOTE — Patient Instructions (Signed)

## 2019-11-01 ENCOUNTER — Telehealth: Payer: Self-pay

## 2019-11-01 LAB — LIPID PANEL
Chol/HDL Ratio: 3.3 ratio (ref 0.0–4.4)
Cholesterol, Total: 173 mg/dL (ref 100–199)
HDL: 52 mg/dL (ref >39)
LDL Chol Calc (NIH): 89 mg/dL (ref 0–99)
Triglycerides: 188 mg/dL — ABNORMAL HIGH (ref 0–149)
VLDL Cholesterol Cal: 32 mg/dL (ref 5–40)

## 2019-11-01 LAB — CBC WITH DIFFERENTIAL/PLATELET
Basophils Absolute: 0 10*3/uL (ref 0.0–0.2)
Basos: 1 %
EOS (ABSOLUTE): 0.1 10*3/uL (ref 0.0–0.4)
Eos: 3 %
Hematocrit: 43.5 % (ref 34.0–46.6)
Hemoglobin: 14.1 g/dL (ref 11.1–15.9)
Immature Grans (Abs): 0 10*3/uL (ref 0.0–0.1)
Immature Granulocytes: 0 %
Lymphocytes Absolute: 1.5 10*3/uL (ref 0.7–3.1)
Lymphs: 30 %
MCH: 31.3 pg (ref 26.6–33.0)
MCHC: 32.4 g/dL (ref 31.5–35.7)
MCV: 97 fL (ref 79–97)
Monocytes Absolute: 0.4 10*3/uL (ref 0.1–0.9)
Monocytes: 7 %
Neutrophils Absolute: 2.9 10*3/uL (ref 1.4–7.0)
Neutrophils: 59 %
Platelets: 215 10*3/uL (ref 150–450)
RBC: 4.5 x10E6/uL (ref 3.77–5.28)
RDW: 12.8 % (ref 11.7–15.4)
WBC: 4.9 10*3/uL (ref 3.4–10.8)

## 2019-11-01 LAB — COMPREHENSIVE METABOLIC PANEL
ALT: 20 IU/L (ref 0–32)
AST: 20 IU/L (ref 0–40)
Albumin/Globulin Ratio: 1.9 (ref 1.2–2.2)
Albumin: 4.3 g/dL (ref 3.7–4.7)
Alkaline Phosphatase: 57 IU/L (ref 48–121)
BUN/Creatinine Ratio: 26 (ref 12–28)
BUN: 18 mg/dL (ref 8–27)
Bilirubin Total: 0.2 mg/dL (ref 0.0–1.2)
CO2: 27 mmol/L (ref 20–29)
Calcium: 10.1 mg/dL (ref 8.7–10.3)
Chloride: 103 mmol/L (ref 96–106)
Creatinine, Ser: 0.68 mg/dL (ref 0.57–1.00)
GFR calc Af Amer: 98 mL/min/{1.73_m2} (ref >59)
GFR calc non Af Amer: 85 mL/min/{1.73_m2} (ref >59)
Globulin, Total: 2.3 g/dL (ref 1.5–4.5)
Glucose: 115 mg/dL — ABNORMAL HIGH (ref 65–99)
Potassium: 3.7 mmol/L (ref 3.5–5.2)
Sodium: 143 mmol/L (ref 134–144)
Total Protein: 6.6 g/dL (ref 6.0–8.5)

## 2019-11-01 LAB — B12 AND FOLATE PANEL
Folate: >20 ng/mL (ref >3.0)
Vitamin B-12: 647 pg/mL (ref 232–1245)

## 2019-11-01 LAB — TSH: TSH: 2.81 u[IU]/mL (ref 0.450–4.500)

## 2019-11-01 NOTE — Telephone Encounter (Signed)
-----   Message from [name], [place] sent at 11/01/2019  9:17 AM EDT ----- Blood count is normal. Kidney and liver function is normal. Sodium, potassium and calcium are normal. Cholesterol is normal. Thyroid is normal. B12 and folate are normal. No source for fatigue.

## 2019-11-01 NOTE — Telephone Encounter (Signed)
Patient advised as directed below. 

## 2019-11-06 DIAGNOSIS — H524 Presbyopia: Secondary | ICD-10-CM | POA: Diagnosis not present

## 2019-11-06 DIAGNOSIS — H40153 Residual stage of open-angle glaucoma, bilateral: Secondary | ICD-10-CM | POA: Diagnosis not present

## 2019-12-11 ENCOUNTER — Telehealth: Payer: Self-pay | Admitting: Physician Assistant

## 2019-12-11 NOTE — Telephone Encounter (Signed)
Copied from CRM [id number]. Topic: Medicare AWV >> Dec 11, 2019  2:27 PM Claudette Laws R wrote: Reason for CRM:  Left message for patient to call back and schedule Medicare Annual Wellness Visit (AWV) either virtually or in office.  Last AWV 10/16/2018   Please schedule at anytime with [facility] Health Advisor.  If any questions, please contact me at [phone]

## 2019-12-18 DIAGNOSIS — Z1231 Encounter for screening mammogram for malignant neoplasm of breast: Secondary | ICD-10-CM | POA: Diagnosis not present

## 2019-12-18 DIAGNOSIS — Z1211 Encounter for screening for malignant neoplasm of colon: Secondary | ICD-10-CM | POA: Diagnosis not present

## 2019-12-18 DIAGNOSIS — Z13820 Encounter for screening for osteoporosis: Secondary | ICD-10-CM | POA: Diagnosis not present

## 2019-12-18 DIAGNOSIS — Z01419 Encounter for gynecological examination (general) (routine) without abnormal findings: Secondary | ICD-10-CM | POA: Diagnosis not present

## 2020-01-31 NOTE — Progress Notes (Deleted)
Subjective:   Nicole Lynn is a 79 y.o. female who presents for Medicare Annual (Subsequent) preventive examination.  Review of Systems    N/A        Objective:    There were no vitals filed for this visit. There is no height or weight on file to calculate BMI.  Advanced Directives 08/13/2016 06/16/2016 01/05/2016 05/16/2015 02/04/2014  Does Patient Have a Medical Advance Directive? Yes Yes Yes Yes No;Yes  Type of Estate agent of [place];Living will Living will;Out of facility DNR (pink MOST or yellow form);Healthcare Power of [employer] of [place];Living will Healthcare Power of [place];Living will;Out of facility DNR (pink MOST or yellow form) Living will;Healthcare Power of [employer] of Healthcare Power of Attorney in Chart? - Yes - - No - copy requested  Would patient like information on creating a medical advance directive? - - - - No - patient declined information    Current Medications (verified) Outpatient Encounter Medications as of 02/04/2020  Medication Sig  . atenolol-chlorthalidone (TENORETIC) 50-25 MG tablet Take 1 tablet by mouth daily.  . fish oil-omega-3 fatty acids 1000 MG capsule Take 2 g by mouth daily.  Marland Kitchen glucose blood (ONETOUCH VERIO) test strip Use to check blood sugar once daily  . latanoprost (XALATAN) 0.005 % ophthalmic solution   . metFORMIN (GLUCOPHAGE) 500 MG tablet Take 1 tablet (500 mg total) by mouth daily with breakfast.  . Multiple Vitamins-Minerals (CENTRUM SILVER PO) Take by mouth once.  Letta Pate DELICA LANCETS FINE MISC To check blood sugars once a day.  DX: E11.9  . rosuvastatin (CRESTOR) 5 MG tablet TAKE 1 TABLET(5 MG) BY MOUTH DAILY  . zolpidem (AMBIEN) 10 MG tablet TAKE 1 TABLET(10 MG) BY MOUTH AT BEDTIME AS NEEDED   No facility-administered encounter medications on file as of 02/04/2020.    Allergies (verified) Latex   History: Past Medical History:  Diagnosis Date  . Anxiety     . Chest pain, unspecified   . HTN (hypertension)   . Menopausal state   . Overweight(278.02)    Past Surgical History:  Procedure Laterality Date  . ABDOMINAL HYSTERECTOMY  2002   TAH/BSO  . APPENDECTOMY  1959  . BILATERAL SALPINGOOPHORECTOMY     Family History  Problem Relation Age of Onset  . Stroke Mother   . Prostate cancer Father   . Cancer Other   . Coronary artery disease Other   . Stroke Other    Social History   Socioeconomic History  . Marital status: Widowed    Spouse name: Not on file  . Number of children: Not on file  . Years of education: Not on file  . Highest education level: Not on file  Occupational History  . Not on file  Tobacco Use  . Smoking status: Never Smoker  . Smokeless tobacco: Never Used  Substance and Sexual Activity  . Alcohol use: Yes    Alcohol/week: 3.0 standard drinks    Types: 3 Glasses of wine per week  . Drug use: No  . Sexual activity: Never    Birth control/protection: Surgical, None    Comment: BTL/HYST  Other Topics Concern  . Not on file  Social History Narrative   Full time. Regularly exercises.    Social Determinants of Health   Financial Resource Strain:   . Difficulty of Paying Living Expenses: Not on file  Food Insecurity:   . Worried About Programme researcher, broadcasting/film/video in the Last Year:  Not on file  . Ran Out of Food in the Last Year: Not on file  Transportation Needs:   . Lack of Transportation (Medical): Not on file  . Lack of Transportation (Non-Medical): Not on file  Physical Activity:   . Days of Exercise per Week: Not on file  . Minutes of Exercise per Session: Not on file  Stress:   . Feeling of Stress : Not on file  Social Connections:   . Frequency of Communication with Friends and Family: Not on file  . Frequency of Social Gatherings with Friends and Family: Not on file  . Attends Religious Services: Not on file  . Active Member of Clubs or Organizations: Not on file  . Attends Banker  Meetings: Not on file  . Marital Status: Not on file    Tobacco Counseling Counseling given: Not Answered   Clinical Intake:                 Diabetic? Yes  Nutrition Risk Assessment:  Has the patient had any N/V/D within the last 2 months?  No  Does the patient have any non-healing wounds?  No  Has the patient had any unintentional weight loss or weight gain?  No   Diabetes:  Is the patient diabetic?  Yes  If diabetic, was a CBG obtained today?  No  Did the patient bring in their glucometer from home?  No  How often do you monitor your CBG's? ***.   Financial Strains and Diabetes Management:  Are you having any financial strains with the device, your supplies or your medication? No .  Does the patient want to be seen by Chronic Care Management for management of their diabetes?  No  Would the patient like to be referred to a Nutritionist or for Diabetic Management?  No   Diabetic Exams:  Diabetic Eye Exam: Overdue for diabetic eye exam. Pt has been advised about the importance in completing this exam. Patient advised to call and schedule an eye exam. Diabetic Foot Exam: Completed 10/31/19.          Activities of Daily Living No flowsheet data found.  Patient Care Team: Reine Just as PCP - General (Family Medicine) Silverio Lay, MD as Consulting Physician (Obstetrics and Gynecology) Lennon Alstrom, MD as Referring Physician (Dermatology)  Indicate any recent Medical Services you may have received from other than [facility] providers in the past year (date may be approximate).     Assessment:   This is a routine wellness examination for Nicole Lynn.  Hearing/Vision screen No exam data present  Dietary issues and exercise activities discussed:    Goals    . Weight (lb) < 180 lb (81.6 kg)     Starting in 2018, I will work to lose 10 lbs this year.       Depression Screen PHQ 2/9 Scores 10/16/2018 07/06/2017 06/16/2016 05/16/2015  PHQ - 2 Score  0 0 0 0    Fall Risk Fall Risk  04/02/2019 10/16/2018 07/06/2017 06/16/2016 05/16/2015  Falls in the past year? 0 0 No No No  Number falls in past yr: 0 - - - -  Injury with Fall? 0 - - - -  Follow up Falls evaluation completed - - - -    Any stairs in or around the home? {YES/NO:21197} If so, are there any without handrails? {YES/NO:21197} Home free of loose throw rugs in walkways, pet beds, electrical cords, etc? Yes  Adequate lighting in your home  to reduce risk of falls? Yes   ASSISTIVE DEVICES UTILIZED TO PREVENT FALLS:  Life alert? {YES/NO:21197} Use of a cane, walker or w/c? {YES/NO:21197} Grab bars in the bathroom? {YES/NO:21197} Shower chair or bench in shower? {YES/NO:21197} Elevated toilet seat or a handicapped toilet? {YES/NO:21197}  TIMED UP AND GO:  Was the test performed? Yes .  Length of time to ambulate 10 feet: *** sec.   {Appearance of [id number]:2101803}  Cognitive Function:     6CIT Screen 10/16/2018 06/16/2016  What Year? 0 points 0 points  What month? 0 points 0 points  What time? 0 points 0 points  Count back from 20 2 points 0 points  Months in reverse 0 points 0 points  Repeat phrase 8 points 6 points  Total Score 10 6    Immunizations Immunization History  Administered Date(s) Administered  . Fluad Quad(high Dose 65+) 03/06/2019  . Influenza Split 06/13/2012  . Influenza, High Dose Seasonal PF 02/18/2014, 01/05/2016, 02/08/2018  . Influenza,inj,Quad PF,6+ Mos 01/08/2013, 05/16/2015  . Pneumococcal Conjugate-13 05/16/2015  . Pneumococcal Polysaccharide-23 01/08/2013  . Zoster 10/25/2012    TDAP status: Due, Education has been provided regarding the importance of this vaccine. Advised may receive this vaccine at local pharmacy or Health Dept. Aware to provide a copy of the vaccination record if obtained from local pharmacy or Health Dept. Verbalized acceptance and understanding. {Flu Vaccine status:2101806} Pneumococcal vaccine status: Up to  date {Covid-19 vaccine status:2101808}  Qualifies for Shingles Vaccine? Yes   Zostavax completed Yes   Shingrix Completed?: No.    Education has been provided regarding the importance of this vaccine. Patient has been advised to call insurance company to determine out of pocket expense if they have not yet received this vaccine. Advised may also receive vaccine at local pharmacy or Health Dept. Verbalized acceptance and understanding.  Screening Tests Health Maintenance  Topic Date Due  . COVID-19 Vaccine (1) Never done  . OPHTHALMOLOGY EXAM  04/26/2017  . INFLUENZA VACCINE  11/25/2019  . TETANUS/TDAP  10/30/2024 (Originally 10/04/1959)  . HEMOGLOBIN A1C  05/02/2020  . FOOT EXAM  10/30/2020  . DEXA SCAN  Completed  . PNA vac Low Risk Adult  Completed    Health Maintenance  Health Maintenance Due  Topic Date Due  . COVID-19 Vaccine (1) Never done  . OPHTHALMOLOGY EXAM  04/26/2017  . INFLUENZA VACCINE  11/25/2019    Colorectal cancer screening: No longer required.  Mammogram status: No longer required.  {Bone Density status:21018021}  Lung Cancer Screening: (Low Dose CT Chest recommended if Age 47-80 years, 30 pack-year currently smoking OR have quit w/in 15years.) does not qualify.   Additional Screening:  Vision Screening: Recommended annual ophthalmology exams for early detection of glaucoma and other disorders of the eye. Is the patient up to date with their annual eye exam?  Yes  Who is the provider or what is the name of the office in which the patient attends annual eye exams? *** If pt is not established with a provider, would they like to be referred to a provider to establish care? No .   Dental Screening: Recommended annual dental exams for proper oral hygiene  Community Resource Referral / Chronic Care Management: CRR required this visit?  No   CCM required this visit?  No      Plan:     I have personally reviewed and noted the following in the patient's  chart:   . Medical and social history . Use of alcohol,  tobacco or illicit drugs  . Current medications and supplements . Functional ability and status . Nutritional status . Physical activity . Advanced directives . List of other physicians . Hospitalizations, surgeries, and ER visits in previous 12 months . Vitals . Screenings to include cognitive, depression, and falls . Referrals and appointments  In addition, I have reviewed and discussed with patient certain preventive protocols, quality metrics, and best practice recommendations. A written personalized care plan for preventive services as well as general preventive health recommendations were provided to patient.     Melquisedec Journey [place], [place]   85/07/6268   Nurse Notes: ***

## 2020-02-20 ENCOUNTER — Telehealth: Payer: Self-pay | Admitting: Physician Assistant

## 2020-02-20 NOTE — Telephone Encounter (Signed)
Copied from CRM [id number]. Topic: Medicare AWV >> Feb 20, 2020  1:07 PM Claudette Laws R wrote: Reason for CRM:   Left message for patient to call back and schedule Medicare Annual Wellness Visit (AWV) either virtually or in office.  Last AWV  10/16/2018  Please schedule at anytime with [facility] Health Advisor.  If any questions, please contact me at [phone]

## 2020-03-18 DIAGNOSIS — Z1231 Encounter for screening mammogram for malignant neoplasm of breast: Secondary | ICD-10-CM | POA: Diagnosis not present

## 2020-04-11 ENCOUNTER — Other Ambulatory Visit: Payer: Self-pay

## 2020-04-11 ENCOUNTER — Ambulatory Visit (INDEPENDENT_AMBULATORY_CARE_PROVIDER_SITE_OTHER): Payer: Medicare Other | Admitting: Physician Assistant

## 2020-04-11 DIAGNOSIS — Z23 Encounter for immunization: Secondary | ICD-10-CM | POA: Diagnosis not present

## 2020-04-11 NOTE — Progress Notes (Signed)
Flu vaccine given today without complication. Patient sat upright for 15 minutes to check for adverse reaction before being released. °

## 2020-04-16 NOTE — Telephone Encounter (Signed)
This encounter was created in error - please disregard.

## 2020-05-02 NOTE — Progress Notes (Signed)
Established patient visit   Patient: Nicole Lynn   DOB: 20-Jun-1940   80 y.o. Female  MRN: [id number] Visit Date: 05/05/2020  Today's healthcare provider: Mar Daring, PA-C   No chief complaint on file.  Subjective    HPI  Diabetes Mellitus Type II, Follow-up  Lab Results  Component Value Date   HGBA1C 7.2 (A) 10/31/2019   HGBA1C 6.6 (H) 04/02/2019   HGBA1C 6.9 (H) 10/16/2018   Wt Readings from Last 3 Encounters:  05/05/20 186 lb (84.4 kg)  10/31/19 188 lb 9.6 oz (85.5 kg)  04/02/19 187 lb (84.8 kg)   Last seen for diabetes 6 weeks ago.  Management since then includes nothing. She reports excellent compliance with treatment. She is not having side effects.  Symptoms: Yes fatigue No foot ulcerations  No appetite changes No nausea  No paresthesia of the feet  No polydipsia  No polyuria No visual disturbances   No vomiting     Home blood sugar records: less than 200 but more than 120  Episodes of hypoglycemia? No   Current insulin regiment: N/A Most Recent Eye Exam: 05/05/2020 Current exercise: none Current diet habits: in general, a "healthy" diet    Pertinent Labs: Lab Results  Component Value Date   CHOL 173 10/31/2019   HDL 52 10/31/2019   LDLCALC 89 10/31/2019   TRIG 188 (H) 10/31/2019   CHOLHDL 3.3 10/31/2019   Lab Results  Component Value Date   NA 143 10/31/2019   K 3.7 10/31/2019   CREATININE 0.68 10/31/2019   GFRNONAA 85 10/31/2019   GFRAA 98 10/31/2019   GLUCOSE 115 (H) 10/31/2019     ---------------------------------------------------------------------------------------------------  Patient Active Problem List   Diagnosis Date Noted  . Diabetes mellitus ([place]) 06/06/2015  . Anxiety 05/16/2015  . Claustrophobia 05/16/2015  . Fatigue 05/16/2015  . Hypercholesteremia 05/16/2015  . Adiposity 05/16/2015  . Gonalgia 05/16/2015  . Subclinical hypothyroidism 05/16/2015  . History of recurrent UTIs 05/16/2015  .  Insomnia 02/28/2015  . Varicose veins of leg with complications 38/01/1750  . Menopausal state   . HTN (hypertension)   . OVERWEIGHT/OBESITY 10/17/2008  . Chest pain 10/17/2008   Past Medical History:  Diagnosis Date  . Anxiety   . Chest pain, unspecified   . HTN (hypertension)   . Menopausal state   . Overweight(278.02)    Family History  Problem Relation Age of Onset  . Stroke Mother   . Prostate cancer Father   . Cancer Other   . Coronary artery disease Other   . Stroke Other    Allergies  Allergen Reactions  . Latex        Medications: Outpatient Medications Prior to Visit  Medication Sig  . atenolol-chlorthalidone (TENORETIC) 50-25 MG tablet Take 1 tablet by mouth daily.  . fish oil-omega-3 fatty acids 1000 MG capsule Take 2 g by mouth daily.  Marland Kitchen glucose blood (ONETOUCH VERIO) test strip Use to check blood sugar once daily  . latanoprost (XALATAN) 0.005 % ophthalmic solution   . metFORMIN (GLUCOPHAGE) 500 MG tablet Take 1 tablet (500 mg total) by mouth daily with breakfast.  . Multiple Vitamins-Minerals (CENTRUM SILVER PO) Take by mouth once.  Glory Rosebush DELICA LANCETS FINE MISC To check blood sugars once a day.  DX: E11.9  . rosuvastatin (CRESTOR) 5 MG tablet TAKE 1 TABLET(5 MG) BY MOUTH DAILY  . zolpidem (AMBIEN) 10 MG tablet TAKE 1 TABLET(10 MG) BY MOUTH AT BEDTIME AS NEEDED  No facility-administered medications prior to visit.    Review of Systems  Constitutional: Positive for fatigue.  Respiratory: Negative.   Cardiovascular: Negative.   Gastrointestinal: Negative.   Neurological: Negative.     Last CBC Lab Results  Component Value Date   WBC 4.9 10/31/2019   HGB 14.1 10/31/2019   HCT 43.5 10/31/2019   MCV 97 10/31/2019   MCH 31.3 10/31/2019   RDW 12.8 10/31/2019   PLT 215 10/31/2019   Last metabolic panel Lab Results  Component Value Date   GLUCOSE 115 (H) 10/31/2019   NA 143 10/31/2019   K 3.7 10/31/2019   CL 103 10/31/2019   CO2 27  10/31/2019   BUN 18 10/31/2019   CREATININE 0.68 10/31/2019   GFRNONAA 85 10/31/2019   GFRAA 98 10/31/2019   CALCIUM 10.1 10/31/2019   PROT 6.6 10/31/2019   ALBUMIN 4.3 10/31/2019   LABGLOB 2.3 10/31/2019   AGRATIO 1.9 10/31/2019   BILITOT 0.2 10/31/2019   ALKPHOS 57 10/31/2019   AST 20 10/31/2019   ALT 20 10/31/2019   ANIONGAP 7 06/21/2013      Objective    BP (!) 170/82 (BP Location: Left Arm, Patient Position: Sitting, Cuff Size: Large)   Pulse 84   Temp 98.1 F (36.7 C) (Oral)   Ht 5\' 1"  (1.549 m)   Wt 186 lb (84.4 kg)   SpO2 97%   BMI 35.14 kg/m  BP Readings from Last 3 Encounters:  05/05/20 140/68  10/31/19 124/76  04/02/19 136/77   Wt Readings from Last 3 Encounters:  05/05/20 186 lb (84.4 kg)  10/31/19 188 lb 9.6 oz (85.5 kg)  04/02/19 187 lb (84.8 kg)      Physical Exam Vitals reviewed.  Constitutional:      General: She is not in acute distress.    Appearance: Normal appearance. She is well-developed and well-nourished. She is obese. She is not ill-appearing or diaphoretic.  Neck:     Thyroid: No thyromegaly.     Vascular: No JVD.     Trachea: No tracheal deviation.  Cardiovascular:     Rate and Rhythm: Normal rate and regular rhythm.     Pulses: Normal pulses.     Heart sounds: Normal heart sounds. No murmur heard. No friction rub. No gallop.   Pulmonary:     Effort: Pulmonary effort is normal. No respiratory distress.     Breath sounds: Normal breath sounds. No wheezing or rales.  Musculoskeletal:     Cervical back: Normal range of motion and neck supple.  Lymphadenopathy:     Cervical: No cervical adenopathy.  Skin:    General: Skin is warm and dry.  Neurological:     General: No focal deficit present.     Mental Status: She is alert.  Psychiatric:        Mood and Affect: Mood normal.        Thought Content: Thought content normal.     No results found for any visits on 05/05/20.  Assessment & Plan     1. Primary  insomnia Stable. Diagnosis pulled for medication refill. Continue current medical treatment plan. - zolpidem (AMBIEN) 10 MG tablet; Take 1 tablet (10 mg total) by mouth at bedtime as needed for sleep.  Dispense: 30 tablet; Refill: 5  2. Primary hypertension BP much better on recheck. Continue Atenolol-Chlorthalidone 5-25mg . Will check labs as below and f/u pending results. - HgB A1c - Basic Metabolic Panel (BMET)  3. Type 2 diabetes mellitus with  hyperglycemia, without long-term current use of insulin (HCC) Has been stable with metformin 500mg  daily. On a statin. Will check labs as below and f/u pending results. - HgB A1c - Basic Metabolic Panel (BMET)   No follow-ups on file.      , PA-C, have reviewed all documentation for this visit. The documentation on 05/05/20 for the exam, diagnosis, procedures, and orders are all accurate and complete.   07/03/20  [facility] [phone] (phone) [phone] (fax)  [facility]

## 2020-05-05 ENCOUNTER — Other Ambulatory Visit: Payer: Self-pay

## 2020-05-05 ENCOUNTER — Encounter: Payer: Self-pay | Admitting: Physician Assistant

## 2020-05-05 ENCOUNTER — Ambulatory Visit (INDEPENDENT_AMBULATORY_CARE_PROVIDER_SITE_OTHER): Payer: Medicare Other | Admitting: Physician Assistant

## 2020-05-05 VITALS — BP 140/68 | HR 84 | Temp 98.1°F | Ht 61.0 in | Wt 186.0 lb

## 2020-05-05 DIAGNOSIS — I10 Essential (primary) hypertension: Secondary | ICD-10-CM

## 2020-05-05 DIAGNOSIS — H40153 Residual stage of open-angle glaucoma, bilateral: Secondary | ICD-10-CM | POA: Diagnosis not present

## 2020-05-05 DIAGNOSIS — E1165 Type 2 diabetes mellitus with hyperglycemia: Secondary | ICD-10-CM | POA: Diagnosis not present

## 2020-05-05 DIAGNOSIS — F5101 Primary insomnia: Secondary | ICD-10-CM

## 2020-05-05 MED ORDER — ZOLPIDEM TARTRATE 10 MG PO TABS: 10.0000 mg | ORAL_TABLET | Freq: Every evening | ORAL | 5 refills | Status: DC | PRN

## 2020-05-05 NOTE — Patient Instructions (Signed)

## 2020-05-06 ENCOUNTER — Telehealth: Payer: Self-pay

## 2020-05-06 LAB — BASIC METABOLIC PANEL
BUN/Creatinine Ratio: 37 — ABNORMAL HIGH (ref 12–28)
BUN: 24 mg/dL (ref 8–27)
CO2: 27 mmol/L (ref 20–29)
Calcium: 10.2 mg/dL (ref 8.7–10.3)
Chloride: 99 mmol/L (ref 96–106)
Creatinine, Ser: 0.65 mg/dL (ref 0.57–1.00)
GFR calc Af Amer: 99 mL/min/{1.73_m2} (ref >59)
GFR calc non Af Amer: 86 mL/min/{1.73_m2} (ref >59)
Glucose: 154 mg/dL — ABNORMAL HIGH (ref 65–99)
Potassium: 3.7 mmol/L (ref 3.5–5.2)
Sodium: 140 mmol/L (ref 134–144)

## 2020-05-06 LAB — HEMOGLOBIN A1C
Est. average glucose Bld gHb Est-mCnc: 157 mg/dL
Hgb A1c MFr Bld: 7.1 % — ABNORMAL HIGH (ref 4.8–5.6)

## 2020-05-06 NOTE — Telephone Encounter (Signed)
Pt advised.   Thanks,   -Kaylani Fromme  

## 2020-05-06 NOTE — Telephone Encounter (Signed)
-----   Message from [name], PA-C sent at 05/06/2020  9:15 AM EST ----- All labs are stable.

## 2020-07-10 ENCOUNTER — Other Ambulatory Visit: Payer: Self-pay

## 2020-07-10 ENCOUNTER — Ambulatory Visit (INDEPENDENT_AMBULATORY_CARE_PROVIDER_SITE_OTHER): Payer: Medicare Other | Admitting: Adult Health

## 2020-07-10 ENCOUNTER — Encounter: Payer: Self-pay | Admitting: Adult Health

## 2020-07-10 VITALS — BP 147/68 | HR 69 | Resp 16 | Wt 188.4 lb

## 2020-07-10 DIAGNOSIS — I10 Essential (primary) hypertension: Secondary | ICD-10-CM | POA: Diagnosis not present

## 2020-07-10 DIAGNOSIS — E1165 Type 2 diabetes mellitus with hyperglycemia: Secondary | ICD-10-CM

## 2020-07-10 DIAGNOSIS — R5383 Other fatigue: Secondary | ICD-10-CM | POA: Diagnosis not present

## 2020-07-10 DIAGNOSIS — R42 Dizziness and giddiness: Secondary | ICD-10-CM

## 2020-07-10 LAB — POCT URINALYSIS DIPSTICK
Bilirubin, UA: NEGATIVE
Blood, UA: NEGATIVE
Glucose, UA: NEGATIVE
Ketones, UA: NEGATIVE
Leukocytes, UA: NEGATIVE
Nitrite, UA: NEGATIVE
Protein, UA: NEGATIVE
Spec Grav, UA: <=1.005 — AB (ref 1.010–1.025)
Urobilinogen, UA: 0.2 E.U./dL
pH, UA: 7 (ref 5.0–8.0)

## 2020-07-10 LAB — POCT GLYCOSYLATED HEMOGLOBIN (HGB A1C): Hemoglobin A1C: 6.9 % — AB (ref 4.0–5.6)

## 2020-07-10 LAB — GLUCOSE, POCT (MANUAL RESULT ENTRY): POC Glucose: 136 mg/dl — AB (ref 70–99)

## 2020-07-10 NOTE — Progress Notes (Signed)
Established patient visit   Patient: Nicole Lynn   DOB: March 26, 1941   80 y.o. Female  MRN: [id number] Visit Date: 07/10/2020  Today's healthcare provider: Jairo Ben, FNP   Chief Complaint  Patient presents with  . Diabetes   Subjective    HPI  Diabetes Mellitus Type II, Follow-up  Lab Results  Component Value Date   HGBA1C 6.9 (A) 07/10/2020   HGBA1C 7.1 (H) 05/05/2020   HGBA1C 7.2 (A) 10/31/2019   Wt Readings from Last 3 Encounters:  07/10/20 188 lb 6.4 oz (85.5 kg)  05/05/20 186 lb (84.4 kg)  10/31/19 188 lb 9.6 oz (85.5 kg)   Last seen for diabetes 2 months ago.  Management since then includes none. She reports good compliance with treatment. She is not having side effects.  Symptoms: Yes fatigue No foot ulcerations  No appetite changes No nausea  No paresthesia of the feet  No polydipsia  No polyuria No visual disturbances   No vomiting     Home blood sugar records: 115-295 patient reports that over the past 3 days she has noticed fluctuation throughout the day of her blood sugar readings and reports symptoms of fatigue increased.  She reports she got the 295 reading of blood glucose aroun an hour after eating and one hour after that it was 120. She has felt dizzy headed a few times she coorelated this to when her sugar is high.  She had diarrhea for one day over two days ago and denied any other symptoms.   Denies any other recent illness. Denies any falls. Denies any head trauma.    Episodes of hypoglycemia? No    Current insulin regiment: none Most Recent Eye Exam: 05/05/20 Current exercise: none Current diet habits: not asked   Patient  denies any fever, body aches,chills, rash, chest pain, shortness of breath, nausea, vomiting, or diarrhea.  Deniesightheadedness, pre syncopal or syncopal episodes.   Pertinent Labs: Lab Results  Component Value Date   CHOL 173 10/31/2019   HDL 52 10/31/2019   LDLCALC 89 10/31/2019    TRIG 188 (H) 10/31/2019   CHOLHDL 3.3 10/31/2019   Lab Results  Component Value Date   NA 140 05/05/2020   K 3.7 05/05/2020   CREATININE 0.65 05/05/2020   GFRNONAA 86 05/05/2020   GFRAA 99 05/05/2020   GLUCOSE 154 (H) 05/05/2020     ---------------------------------------------------------------------------------------------------     Medications: Outpatient Medications Prior to Visit  Medication Sig  . atenolol-chlorthalidone (TENORETIC) 50-25 MG tablet Take 1 tablet by mouth daily.  . fish oil-omega-3 fatty acids 1000 MG capsule Take 2 g by mouth daily.  Marland Kitchen glucose blood (ONETOUCH VERIO) test strip Use to check blood sugar once daily  . HYDROcodone-homatropine (HYCODAN) 5-1.5 MG/5ML syrup Hydromet 5 mg-1.5 mg/5 mL oral syrup  TK 5 MLS PO Q 4 TO 6 H PRN F COUGH  . latanoprost (XALATAN) 0.005 % ophthalmic solution   . metFORMIN (GLUCOPHAGE) 500 MG tablet Take 1 tablet (500 mg total) by mouth daily with breakfast.  . Multiple Vitamins-Minerals (CENTRUM SILVER PO) Take by mouth once.  Letta Pate DELICA LANCETS FINE MISC To check blood sugars once a day.  DX: E11.9  . rosuvastatin (CRESTOR) 5 MG tablet TAKE 1 TABLET(5 MG) BY MOUTH DAILY  . zolpidem (AMBIEN) 10 MG tablet Take 1 tablet (10 mg total) by mouth at bedtime as needed for sleep.   No facility-administered medications prior to visit.    Review of  Systems  Constitutional: Negative.   HENT: Negative.   Respiratory: Negative.   Cardiovascular: Negative.   Gastrointestinal: Positive for diarrhea.  Endocrine: Negative for polydipsia, polyphagia and polyuria.  Genitourinary: Negative.   Musculoskeletal: Positive for arthralgias.  Skin: Negative.   Neurological: Positive for dizziness.    Last CBC Lab Results  Component Value Date   WBC 4.9 10/31/2019   HGB 14.1 10/31/2019   HCT 43.5 10/31/2019   MCV 97 10/31/2019   MCH 31.3 10/31/2019   RDW 12.8 10/31/2019   PLT 215 10/31/2019   Last metabolic panel Lab  Results  Component Value Date   GLUCOSE 154 (H) 05/05/2020   NA 140 05/05/2020   K 3.7 05/05/2020   CL 99 05/05/2020   CO2 27 05/05/2020   BUN 24 05/05/2020   CREATININE 0.65 05/05/2020   GFRNONAA 86 05/05/2020   GFRAA 99 05/05/2020   CALCIUM 10.2 05/05/2020   PROT 6.6 10/31/2019   ALBUMIN 4.3 10/31/2019   LABGLOB 2.3 10/31/2019   AGRATIO 1.9 10/31/2019   BILITOT 0.2 10/31/2019   ALKPHOS 57 10/31/2019   AST 20 10/31/2019   ALT 20 10/31/2019   ANIONGAP 7 06/21/2013   Last lipids Lab Results  Component Value Date   CHOL 173 10/31/2019   HDL 52 10/31/2019   LDLCALC 89 10/31/2019   TRIG 188 (H) 10/31/2019   CHOLHDL 3.3 10/31/2019   Last hemoglobin A1c Lab Results  Component Value Date   HGBA1C 6.9 (A) 07/10/2020   Last thyroid functions Lab Results  Component Value Date   TSH 2.810 10/31/2019   Last vitamin D Lab Results  Component Value Date   VD25OH 36 09/21/2011   Last vitamin B12 and Folate Lab Results  Component Value Date   VITAMINB12 647 10/31/2019   FOLATE >20.0 10/31/2019     Objective    BP (!) 147/68   Pulse 69   Resp 16   Wt 188 lb 6.4 oz (85.5 kg)   SpO2 94%   BMI 35.60 kg/m  BP Readings from Last 3 Encounters:  07/10/20 (!) 147/68  05/05/20 140/68  10/31/19 124/76   Wt Readings from Last 3 Encounters:  07/10/20 188 lb 6.4 oz (85.5 kg)  05/05/20 186 lb (84.4 kg)  10/31/19 188 lb 9.6 oz (85.5 kg)       Physical Exam Vitals and nursing note reviewed.  Constitutional:      General: She is not in acute distress.    Appearance: Normal appearance. She is obese. She is not ill-appearing, toxic-appearing or diaphoretic.  HENT:     Head: Normocephalic and atraumatic.     Right Ear: Tympanic membrane, ear canal and external ear normal. There is no impacted cerumen.     Left Ear: Tympanic membrane, ear canal and external ear normal. There is no impacted cerumen.     Nose: Nose normal. No congestion or rhinorrhea.     Mouth/Throat:      Mouth: Mucous membranes are moist.     Pharynx: Oropharynx is clear. No oropharyngeal exudate or posterior oropharyngeal erythema.  Eyes:     General: No scleral icterus.       Right eye: No discharge.        Left eye: No discharge.     Conjunctiva/sclera: Conjunctivae normal.     Pupils: Pupils are equal, round, and reactive to light.  Cardiovascular:     Rate and Rhythm: Normal rate and regular rhythm.     Pulses: Normal pulses.  Heart sounds: Normal heart sounds. No murmur heard. No friction rub. No gallop.   Pulmonary:     Effort: Pulmonary effort is normal. No respiratory distress.     Breath sounds: Normal breath sounds. No stridor. No wheezing, rhonchi or rales.  Chest:     Chest wall: No tenderness.  Abdominal:     General: Bowel sounds are normal. There is no distension.     Palpations: Abdomen is soft.     Tenderness: There is no abdominal tenderness. There is no guarding.  Genitourinary:    Comments: Deferred.  Musculoskeletal:        General: No tenderness. Normal range of motion.     Cervical back: Normal range of motion and neck supple.     Right lower leg: No edema.     Left lower leg: No edema.  Skin:    General: Skin is warm.     Findings: No erythema, lesion or rash.  Neurological:     General: No focal deficit present.     Mental Status: She is alert and oriented to person, place, and time.     Cranial Nerves: No cranial nerve deficit.     Motor: No weakness.     Gait: Gait normal.     Deep Tendon Reflexes: Reflexes normal.     Comments: Slight unsteady at times getting on the table but reports she is " always like this" walks without difficulty in room and hall and gets on table without any issue alone.   Psychiatric:        Mood and Affect: Mood normal.        Behavior: Behavior normal.        Thought Content: Thought content normal.        Judgment: Judgment normal.       Results for orders placed or performed in visit on 07/10/20  POCT  glucose (manual entry)  Result Value Ref Range   POC Glucose 136 (A) 70 - 99 mg/dl  POCT glycosylated hemoglobin (Hb A1C)  Result Value Ref Range   Hemoglobin A1C 6.9 (A) 4.0 - 5.6 %   HbA1c POC (<> result, manual entry)     HbA1c, POC (prediabetic range)     HbA1c, POC (controlled diabetic range)      Assessment & Plan     Type 2 diabetes mellitus with hyperglycemia, without long-term current use of insulin (HCC) - Plan: POCT glucose (manual entry), POCT glycosylated hemoglobin (Hb A1C)  Fatigue, unspecified type  Dizziness  Essential hypertension - Plan: CBC with Differential/Platelet, Comprehensive Metabolic Panel (CMET), TSH  Continue current medication management. Check blood glucose in am fasting only and if you feel different.  Keep log. Eat small frequent healthy meals.  Increase hydration since you recently had diarrhea.   Red Flags discussed. The patient was given clear instructions to go to ER or return to medical center if any red flags develop, symptoms do not improve, worsen or new problems develop. They verbalized understanding.    Return in about 2 weeks (around 07/24/2020), or if symptoms worsen or fail to improve, for at any time for any worsening symptoms, Go to Emergency room/ urgent care if worse.      The entirety of the information documented in the History of Present Illness, Review of Systems and Physical Exam were personally obtained by me. Portions of this information were initially documented by the CMA and reviewed by me for thoroughness and accuracy.  Marcille Buffy, [place] [phone] (phone) [phone] (fax)  [place]

## 2020-07-10 NOTE — Progress Notes (Signed)
Hemoglobin A1C slightly improved, non fasting glucose in office was 136. See office note for plan.

## 2020-07-10 NOTE — Patient Instructions (Addendum)
Check fasting blood glucose in the morning, keep log.  Monitor blood pressure at home if able.   Labs today.  Follow up in 2 weeks.   Red Flags discussed. The patient was given clear instructions to go to ER or return to medical center if any red flags develop, symptoms do not improve, worsen or new problems develop. They verbalized understanding.   Blood Glucose Monitoring, Adult Monitoring your blood sugar (glucose) is an important part of managing your diabetes. Blood glucose monitoring involves checking your blood glucose as often as directed and keeping a log or record of your results over time. Checking your blood glucose regularly and keeping a blood glucose log can:  Help you and your health care provider adjust your diabetes management plan as needed, including your medicines or insulin.  Help you understand how food, exercise, illnesses, and medicines affect your blood glucose.  Let you know what your blood glucose is at any time. You can quickly find out if you have low blood glucose (hypoglycemia) or high blood glucose (hyperglycemia). Your health care provider will set individualized treatment goals for you. Your goals will be based on your age, other medical conditions you have, and how you respond to diabetes treatment. Generally, the goal of treatment is to maintain the following blood glucose levels:  Before meals (preprandial): 80-130 mg/dL (0.0-7.6 mmol/L).  After meals (postprandial): below 180 mg/dL (10 mmol/L).  A1C level: less than 7%. Supplies needed:  Blood glucose meter.  Test strips for your meter. Each meter has its own strips. You must use the strips that came with your meter.  A needle to prick your finger (lancet). Do not use a lancet more than one time.  A device that holds the lancet (lancing device).  A journal or log book to write down your results. How to check your blood glucose Checking your blood glucose 1. Wash your hands for at least 20  seconds with soap and water. 2. Prick the side of your finger (not the tip) with the lancet. Do not use the same finger consecutively. 3. Gently rub the finger until a small drop of blood appears. 4. Follow instructions that come with your meter for inserting the test strip, applying blood to the strip, and using your blood glucose meter. 5. Write down your result and any notes in your log.   Using alternative sites Some meters allow you to use areas of your body other than your finger (alternative sites) to test your blood. The most common alternative sites are the forearm, the thigh, and the palm of your hand. Alternative sites may not be as accurate as the fingers because blood flow is slower in those areas. This means that the result you get may be delayed, and it may be different from the result that you would get from your finger. Use the finger only, and do not use alternative sites, if:  You think you have hypoglycemia.  You sometimes do not know that your blood glucose is getting low (hypoglycemia unawareness). General tips and recommendations Blood glucose log  Every time you check your blood glucose, write down your result. Also write down any notes about things that may be affecting your blood glucose, such as your diet and exercise for the day. This information can help you and your health care provider: ? Look for patterns in your blood glucose over time. ? Adjust your diabetes management plan as needed.  Check if your meter allows you to download your  records to a computer or if there is an app for the meter. Most glucose meters store a record of glucose readings in the meter.   If you have type 1 diabetes:  Check your blood glucose 4 or more times a day if you are on intensive insulin therapy with multiple daily injections (MDI) or if you are using an insulin pump. Check your blood glucose: ? Before every meal and snack. ? Before bedtime.  Also check your blood  glucose: ? If you have symptoms of hypoglycemia. ? After treating low blood glucose. ? Before doing activities that create a risk for injury, like driving or using machinery. ? Before and after exercise. ? Two hours after a meal. ? Occasionally between 2:00 a.m. and 3:00 a.m., as directed.  You may need to check your blood glucose more often, 6-10 times per day, if: ? You have diabetes that is not well controlled. ? You are ill. ? You have a history of severe hypoglycemia. ? You have hypoglycemia unawareness. If you have type 2 diabetes:  Check your blood glucose 2 or more times a day if you take insulin or other diabetes medicines.  Check your blood glucose 4 or more times a day if you are on intensive insulin therapy. Occasionally, you may also need to check your glucose between 2:00 a.m. and 3:00 a.m., as directed.  Also check your blood glucose: ? Before and after exercise. ? Before doing activities that create a risk for injury, like driving or using machinery.  You may need to check your blood glucose more often if: ? Your medicine is being adjusted. ? Your diabetes is not well controlled. ? You are ill. General tips  Make sure you always have your supplies with you.  After you use a few boxes of test strips, adjust (calibrate) your blood glucose meter by following instructions that came with your meter.  If you have questions or need help, all blood glucose meters have a 24-hour hotline phone number available that you can call. Also contact your health care provider with questions or concerns you may have. Where to find more information  The American Diabetes Association: www.diabetes.org  The Association of Diabetes Care & Education Specialists: www.diabeteseducator.org Contact a health care provider if:  Your blood glucose is at or above 240 mg/dL (62.1 mmol/L) for 2 days in a row.  You have been sick or have had a fever for 2 days or longer, and you are not  getting better.  You have any of the following problems for more than 6 hours: ? You cannot eat or drink. ? You have nausea or vomiting. ? You have diarrhea. Get help right away if:  Your blood glucose is lower than 54 mg/dL (3 mmol/L).  You become confused, or you have trouble thinking clearly.  You have difficulty breathing.  You have moderate or large ketone levels in your urine. These symptoms may represent a serious problem that is an emergency. Do not wait to see if the symptoms will go away. Get medical help right away. Call your local emergency services (911 in the [place] Do not drive yourself to the hospital. Summary  Monitoring your blood glucose is an important part of managing your diabetes.  Blood glucose monitoring involves checking your blood glucose as often as directed and keeping a log or record of your results over time.  Your health care provider will set individualized treatment goals for you. Your goals will be based on your  age, other medical conditions you have, and how you respond to diabetes treatment.  Every time you check your blood glucose, write down your result. Also, write down any notes about things that may be affecting your blood glucose, such as your diet and exercise for the day. This information is not intended to replace advice given to you by your health care provider. Make sure you discuss any questions you have with your health care provider. Document Revised: 01/09/2020 Document Reviewed: 01/09/2020 Elsevier Patient Education  2021 [employer] Inc[employer]  Dizziness Dizziness is a common problem. It makes you feel unsteady or light-headed. You may feel like you are about to pass out (faint). Dizziness can lead to getting hurt if you stumble or fall. Dizziness can be caused by many things, including:  Medicines.  Not having enough water in your body (dehydration).  Illness. Follow these instructions at home: Eating and drinking  Drink enough  fluid to keep your pee (urine) clear or pale yellow. This helps to keep you from getting dehydrated. Try to drink more clear fluids, such as water.  Do not drink alcohol.  Limit how much caffeine you drink or eat, if your doctor tells you to do that.  Limit how much salt (sodium) you drink or eat, if your doctor tells you to do that.   Activity  Avoid making quick movements. ? When you stand up from sitting in a chair, steady yourself until you feel okay. ? In the morning, first sit up on the side of the bed. When you feel okay, stand slowly while you hold onto something. Do this until you know that your balance is fine.  If you need to stand in one place for a long time, move your legs often. Tighten and relax the muscles in your legs while you are standing.  Do not drive or use heavy machinery if you feel dizzy.  Avoid bending down if you feel dizzy. Place items in your home so you can reach them easily without leaning over.   Lifestyle  Do not use any products that contain nicotine or tobacco, such as cigarettes and e-cigarettes. If you need help quitting, ask your doctor.  Try to lower your stress level. You can do this by using methods such as yoga or meditation. Talk with your doctor if you need help. General instructions  Watch your dizziness for any changes.  Take over-the-counter and prescription medicines only as told by your doctor. Talk with your doctor if you think that you are dizzy because of a medicine that you are taking.  Tell a friend or a family member that you are feeling dizzy. If he or she notices any changes in your behavior, have this person call your doctor.  Keep all follow-up visits as told by your doctor. This is important. Contact a doctor if:  Your dizziness does not go away.  Your dizziness or light-headedness gets worse.  You feel sick to your stomach (nauseous).  You have trouble hearing.  You have new symptoms.  You are unsteady on your  feet.  You feel like the room is spinning. Get help right away if:  You throw up (vomit) or have watery poop (diarrhea), and you cannot eat or drink anything.  You have trouble: ? Talking. ? Walking. ? Swallowing. ? Using your arms, hands, or legs.  You feel generally weak.  You are not thinking clearly, or you have trouble forming sentences. A friend or family member may notice this.  You have: ? Chest pain. ? Pain in your belly (abdomen). ? Shortness of breath. ? Sweating.  Your vision changes.  You are bleeding.  You have a very bad headache.  You have neck pain or a stiff neck.  You have a fever. These symptoms may be an emergency. Do not wait to see if the symptoms will go away. Get medical help right away. Call your local emergency services (911 in the [place] Do not drive yourself to the hospital. Summary  Dizziness makes you feel unsteady or light-headed. You may feel like you are about to pass out (faint).  Drink enough fluid to keep your pee (urine) clear or pale yellow. Do not drink alcohol.  Avoid making quick movements if you feel dizzy.  Watch your dizziness for any changes. This information is not intended to replace advice given to you by your health care provider. Make sure you discuss any questions you have with your health care provider. Document Revised: 01/02/2020 Document Reviewed: 04/29/2016 Elsevier Patient Education  2021 Elsevier Inc.  Blood Glucose Monitoring, Adult Monitoring your blood sugar (glucose) is an important part of managing your diabetes. Blood glucose monitoring involves checking your blood glucose as often as directed and keeping a log or record of your results over time. Checking your blood glucose regularly and keeping a blood glucose log can:  Help you and your health care provider adjust your diabetes management plan as needed, including your medicines or insulin.  Help you understand how food, exercise, illnesses, and  medicines affect your blood glucose.  Let you know what your blood glucose is at any time. You can quickly find out if you have low blood glucose (hypoglycemia) or high blood glucose (hyperglycemia). Your health care provider will set individualized treatment goals for you. Your goals will be based on your age, other medical conditions you have, and how you respond to diabetes treatment. Generally, the goal of treatment is to maintain the following blood glucose levels:  Before meals (preprandial): 80-130 mg/dL (6.5-7.8 mmol/L).  After meals (postprandial): below 180 mg/dL (10 mmol/L).  A1C level: less than 7%. Supplies needed:  Blood glucose meter.  Test strips for your meter. Each meter has its own strips. You must use the strips that came with your meter.  A needle to prick your finger (lancet). Do not use a lancet more than one time.  A device that holds the lancet (lancing device).  A journal or log book to write down your results. How to check your blood glucose Checking your blood glucose 6. Wash your hands for at least 20 seconds with soap and water. 7. Prick the side of your finger (not the tip) with the lancet. Do not use the same finger consecutively. 8. Gently rub the finger until a small drop of blood appears. 9. Follow instructions that come with your meter for inserting the test strip, applying blood to the strip, and using your blood glucose meter. 10. Write down your result and any notes in your log.   Using alternative sites Some meters allow you to use areas of your body other than your finger (alternative sites) to test your blood. The most common alternative sites are the forearm, the thigh, and the palm of your hand. Alternative sites may not be as accurate as the fingers because blood flow is slower in those areas. This means that the result you get may be delayed, and it may be different from the result that you would get from  your finger. Use the finger only,  and do not use alternative sites, if:  You think you have hypoglycemia.  You sometimes do not know that your blood glucose is getting low (hypoglycemia unawareness). General tips and recommendations Blood glucose log  Every time you check your blood glucose, write down your result. Also write down any notes about things that may be affecting your blood glucose, such as your diet and exercise for the day. This information can help you and your health care provider: ? Look for patterns in your blood glucose over time. ? Adjust your diabetes management plan as needed.  Check if your meter allows you to download your records to a computer or if there is an app for the meter. Most glucose meters store a record of glucose readings in the meter.   If you have type 1 diabetes:  Check your blood glucose 4 or more times a day if you are on intensive insulin therapy with multiple daily injections (MDI) or if you are using an insulin pump. Check your blood glucose: ? Before every meal and snack. ? Before bedtime.  Also check your blood glucose: ? If you have symptoms of hypoglycemia. ? After treating low blood glucose. ? Before doing activities that create a risk for injury, like driving or using machinery. ? Before and after exercise. ? Two hours after a meal. ? Occasionally between 2:00 a.m. and 3:00 a.m., as directed.  You may need to check your blood glucose more often, 6-10 times per day, if: ? You have diabetes that is not well controlled. ? You are ill. ? You have a history of severe hypoglycemia. ? You have hypoglycemia unawareness. If you have type 2 diabetes:  Check your blood glucose 2 or more times a day if you take insulin or other diabetes medicines.  Check your blood glucose 4 or more times a day if you are on intensive insulin therapy. Occasionally, you may also need to check your glucose between 2:00 a.m. and 3:00 a.m., as directed.  Also check your blood glucose: ? Before  and after exercise. ? Before doing activities that create a risk for injury, like driving or using machinery.  You may need to check your blood glucose more often if: ? Your medicine is being adjusted. ? Your diabetes is not well controlled. ? You are ill. General tips  Make sure you always have your supplies with you.  After you use a few boxes of test strips, adjust (calibrate) your blood glucose meter by following instructions that came with your meter.  If you have questions or need help, all blood glucose meters have a 24-hour hotline phone number available that you can call. Also contact your health care provider with questions or concerns you may have. Where to find more information  The American Diabetes Association: www.diabetes.org  The Association of Diabetes Care & Education Specialists: www.diabeteseducator.org Contact a health care provider if:  Your blood glucose is at or above 240 mg/dL (16.1 mmol/L) for 2 days in a row.  You have been sick or have had a fever for 2 days or longer, and you are not getting better.  You have any of the following problems for more than 6 hours: ? You cannot eat or drink. ? You have nausea or vomiting. ? You have diarrhea. Get help right away if:  Your blood glucose is lower than 54 mg/dL (3 mmol/L).  You become confused, or you have trouble thinking clearly.  You  have difficulty breathing.  You have moderate or large ketone levels in your urine. These symptoms may represent a serious problem that is an emergency. Do not wait to see if the symptoms will go away. Get medical help right away. Call your local emergency services (911 in the [place] Do not drive yourself to the hospital. Summary  Monitoring your blood glucose is an important part of managing your diabetes.  Blood glucose monitoring involves checking your blood glucose as often as directed and keeping a log or record of your results over time.  Your health care  provider will set individualized treatment goals for you. Your goals will be based on your age, other medical conditions you have, and how you respond to diabetes treatment.  Every time you check your blood glucose, write down your result. Also, write down any notes about things that may be affecting your blood glucose, such as your diet and exercise for the day. This information is not intended to replace advice given to you by your health care provider. Make sure you discuss any questions you have with your health care provider. Document Revised: 01/09/2020 Document Reviewed: 01/09/2020 Elsevier Patient Education  2021 [employer]

## 2020-07-11 LAB — CBC WITH DIFFERENTIAL/PLATELET
Basophils Absolute: 0 10*3/uL (ref 0.0–0.2)
Basos: 0 %
EOS (ABSOLUTE): 0.1 10*3/uL (ref 0.0–0.4)
Eos: 1 %
Hematocrit: 41.7 % (ref 34.0–46.6)
Hemoglobin: 14.4 g/dL (ref 11.1–15.9)
Immature Grans (Abs): 0.1 10*3/uL (ref 0.0–0.1)
Immature Granulocytes: 1 %
Lymphocytes Absolute: 1.7 10*3/uL (ref 0.7–3.1)
Lymphs: 17 %
MCH: 32.8 pg (ref 26.6–33.0)
MCHC: 34.5 g/dL (ref 31.5–35.7)
MCV: 95 fL (ref 79–97)
Monocytes Absolute: 0.9 10*3/uL (ref 0.1–0.9)
Monocytes: 9 %
Neutrophils Absolute: 7.4 10*3/uL — ABNORMAL HIGH (ref 1.4–7.0)
Neutrophils: 72 %
Platelets: 240 10*3/uL (ref 150–450)
RBC: 4.39 x10E6/uL (ref 3.77–5.28)
RDW: 12.3 % (ref 11.7–15.4)
WBC: 10.3 10*3/uL (ref 3.4–10.8)

## 2020-07-11 LAB — COMPREHENSIVE METABOLIC PANEL
ALT: 13 IU/L (ref 0–32)
AST: 15 IU/L (ref 0–40)
Albumin/Globulin Ratio: 1.6 (ref 1.2–2.2)
Albumin: 4.2 g/dL (ref 3.7–4.7)
Alkaline Phosphatase: 81 IU/L (ref 44–121)
BUN/Creatinine Ratio: 23 (ref 12–28)
BUN: 16 mg/dL (ref 8–27)
Bilirubin Total: 0.4 mg/dL (ref 0.0–1.2)
CO2: 25 mmol/L (ref 20–29)
Calcium: 9.9 mg/dL (ref 8.7–10.3)
Chloride: 93 mmol/L — ABNORMAL LOW (ref 96–106)
Creatinine, Ser: 0.71 mg/dL (ref 0.57–1.00)
Globulin, Total: 2.7 g/dL (ref 1.5–4.5)
Glucose: 124 mg/dL — ABNORMAL HIGH (ref 65–99)
Potassium: 3.6 mmol/L (ref 3.5–5.2)
Sodium: 136 mmol/L (ref 134–144)
Total Protein: 6.9 g/dL (ref 6.0–8.5)
eGFR: 88 mL/min/{1.73_m2} (ref >59)

## 2020-07-11 LAB — TSH: TSH: 3.9 u[IU]/mL (ref 0.450–4.500)

## 2020-07-11 NOTE — Progress Notes (Signed)
CBC ok , neutrophil's mildly elevated, watch for any signs of infection/illness.CMP ok glucose elevated but not fasting. TSH within normal limits.

## 2020-07-15 ENCOUNTER — Emergency Department
Admission: EM | Admit: 2020-07-15 | Discharge: 2020-07-15 | Disposition: A | Discharge status: Home / Self Care | Payer: Medicare Other | Attending: Emergency Medicine | Admitting: Emergency Medicine

## 2020-07-15 ENCOUNTER — Other Ambulatory Visit: Payer: Self-pay

## 2020-07-15 ENCOUNTER — Ambulatory Visit: Payer: Self-pay | Admitting: *Deleted

## 2020-07-15 ENCOUNTER — Emergency Department: Payer: Medicare Other

## 2020-07-15 DIAGNOSIS — Z91040 Latex allergy status: Secondary | ICD-10-CM | POA: Diagnosis not present

## 2020-07-15 DIAGNOSIS — Z79899 Other long term (current) drug therapy: Secondary | ICD-10-CM | POA: Insufficient documentation

## 2020-07-15 DIAGNOSIS — I10 Essential (primary) hypertension: Secondary | ICD-10-CM | POA: Insufficient documentation

## 2020-07-15 DIAGNOSIS — K5900 Constipation, unspecified: Secondary | ICD-10-CM | POA: Insufficient documentation

## 2020-07-15 DIAGNOSIS — Z7984 Long term (current) use of oral hypoglycemic drugs: Secondary | ICD-10-CM | POA: Insufficient documentation

## 2020-07-15 DIAGNOSIS — E119 Type 2 diabetes mellitus without complications: Secondary | ICD-10-CM | POA: Diagnosis not present

## 2020-07-15 MED ORDER — MAGNESIUM CITRATE PO SOLN
1.0000 | Freq: Once | ORAL | Status: AC
  Administered 2020-07-15: 1 via ORAL
  Filled 2020-07-15: qty 296

## 2020-07-15 MED ORDER — GLYCERIN (LAXATIVE) 1.2 G RE SUPP
1.0000 | RECTAL | Status: DC | PRN
  Administered 2020-07-15: 1.2 g via RECTAL
  Filled 2020-07-15: qty 2

## 2020-07-15 NOTE — Discharge Instructions (Addendum)
Follow-up with your regular doctor if not improving to 3 days.  Return emergency department worsening. If you become constipated you could try a laxative that is over-the-counter. Try to eat a high-fiber diet this will help with constipation.  Stool softener such as MiraLAX or Senokot are safe to take daily.  May consider taking this if you begin to have trouble with constipation.

## 2020-07-15 NOTE — ED Notes (Signed)
See triage note  Presents with some abd discomfort   Also has been constipated since last Saturday  No n/v

## 2020-07-15 NOTE — Telephone Encounter (Signed)
Patient is calling to report she is having severe abdominal cramping- lasting 1-2 minutes and occurring every 30 minute- 1 hour. Patient states the pain started  On Sunday and has not gotten better.Passing gas helps- but patient states she is only passing mucus- no stool since Sunday. Advised per disposition- ED. Reason for Disposition . [1] SEVERE pain AND [2] age > 60 years  Answer Assessment - Initial Assessment Questions 1. LOCATION: "Where does it hurt?"      Lower mid abdomen 2. RADIATION: "Does the pain shoot anywhere else?" (e.g., chest, back)     No radiation 3. ONSET: "When did the pain begin?" (e.g., minutes, hours or days ago)      2 1/2 days- Sunday 4. SUDDEN: "Gradual or sudden onset?"     sudden 5. PATTERN "Does the pain come and go, or is it constant?"    - If constant: "Is it getting better, staying the same, or worsening?"      (Note: Constant means the pain never goes away completely; most serious pain is constant and it progresses)     - If intermittent: "How long does it last?" "Do you have pain now?"     (Note: Intermittent means the pain goes away completely between bouts)     Comes and goes, 1-2 minutes- gas helps-  Mucus discharge- no BM since Saturday 6. SEVERITY: "How bad is the pain?"  (e.g., Scale 1-10; mild, moderate, or severe)   - MILD (1-3): doesn't interfere with normal activities, abdomen soft and not tender to touch    - MODERATE (4-7): interferes with normal activities or awakens from sleep, tender to touch    - SEVERE (8-10): excruciating pain, doubled over, unable to do any normal activities      Severe-10 7. RECURRENT SYMPTOM: "Have you ever had this type of stomach pain before?" If Yes, ask: "When was the last time?" and "What happened that time?"      no 8. CAUSE: "What do you think is causing the stomach pain?"     Bowel blockage 9. RELIEVING/AGGRAVATING FACTORS: "What makes it better or worse?" (e.g., movement, antacids, bowel movement)      Passes gas helps 10. OTHER SYMPTOMS: "Has there been any vomiting, diarrhea, constipation, or urine problems?"       constipation 11. PREGNANCY: "Is there any chance you are pregnant?" "When was your last menstrual period?"       n/a  Protocols used: ABDOMINAL PAIN - [facility]

## 2020-07-15 NOTE — ED Triage Notes (Signed)
Pt states rectum pain and constipation. Last BM Saturday. Denies fever or NV. States passing gas. Hasn't tried OTC meds. A&O, ambulatory.

## 2020-07-15 NOTE — ED Provider Notes (Signed)
[facility] Emergency Department Provider Note  ____________________________________________   Event Date/Time   First MD Initiated Contact with Patient 07/15/20 1113     (approximate)  I have reviewed the triage vital signs and the nursing notes.   HISTORY  Chief Complaint Constipation    HPI Nicole Lynn is a 80 y.o. female presents emergency department complaining of constipation.  States she might have a blockage.  Patient states that her last normal bowel movement was Saturday which was 4 days ago.  She states she feels a lot of pressure at the rectum.  She states she eats prunes which did not help at all she is passing his mucus.  Patient has not noticed any blood in her stool.  States she does feel full when she eats.  States she has "had a lot of gas from both ends "she denies any fever or chills.  No vomiting or diarrhea..    Past Medical History:  Diagnosis Date  . Anxiety   . Chest pain, unspecified   . HTN (hypertension)   . Menopausal state   . Overweight(278.02)     Patient Active Problem List   Diagnosis Date Noted  . Dizziness 07/10/2020  . Diabetes mellitus (HCC) 06/06/2015  . Anxiety 05/16/2015  . Claustrophobia 05/16/2015  . Fatigue 05/16/2015  . Hypercholesteremia 05/16/2015  . Adiposity 05/16/2015  . Gonalgia 05/16/2015  . Subclinical hypothyroidism 05/16/2015  . History of recurrent UTIs 05/16/2015  . Insomnia 02/28/2015  . Varicose veins of leg with complications 02/04/2014  . Menopausal state   . HTN (hypertension)   . OVERWEIGHT/OBESITY 10/17/2008  . Chest pain 10/17/2008    Past Surgical History:  Procedure Laterality Date  . ABDOMINAL HYSTERECTOMY  2002   TAH/BSO  . APPENDECTOMY  1959  . BILATERAL SALPINGOOPHORECTOMY      Prior to Admission medications   Medication Sig Start Date End Date Taking? Authorizing Provider  atenolol-chlorthalidone (TENORETIC) 50-25 MG tablet Take 1 tablet by mouth  daily. 10/31/19   [name], PA-C  fish oil-omega-3 fatty acids 1000 MG capsule Take 2 g by mouth daily.    [provider]  glucose blood (ONETOUCH VERIO) test strip Use to check blood sugar once daily 10/31/19   [name], PA-C  HYDROcodone-homatropine (HYCODAN) 5-1.5 MG/5ML syrup Hydromet 5 mg-1.5 mg/5 mL oral syrup  TK 5 MLS PO Q 4 TO 6 H PRN F COUGH    [provider]  latanoprost (XALATAN) 0.005 % ophthalmic solution  09/05/15   [provider]  metFORMIN (GLUCOPHAGE) 500 MG tablet Take 1 tablet (500 mg total) by mouth daily with breakfast. 10/31/19   [name], PA-C  Multiple Vitamins-Minerals (CENTRUM SILVER PO) Take by mouth once.    [provider]  [facility] DELICA LANCETS FINE MISC To check blood sugars once a day.  DX: E11.9 06/24/15   Lorie Phenix, MD  rosuvastatin (CRESTOR) 5 MG tablet TAKE 1 TABLET(5 MG) BY MOUTH DAILY 10/31/19   [name], PA-C  zolpidem (AMBIEN) 10 MG tablet Take 1 tablet (10 mg total) by mouth at bedtime as needed for sleep. 05/05/20   [name], PA-C    Allergies Latex  Family History  Problem Relation Age of Onset  . Stroke Mother   . Prostate cancer Father   . Cancer Other   . Coronary artery disease Other   . Stroke Other     Social History Social History   Tobacco  Use  . Smoking status: Never Smoker  . Smokeless tobacco: Never Used  Substance Use Topics  . Alcohol use: Yes    Alcohol/week: 3.0 standard drinks    Types: 3 Glasses of wine per week  . Drug use: No    Review of Systems  Constitutional: No fever/chills Eyes: No visual changes. ENT: No sore throat. Respiratory: Denies cough Cardiovascular: Denies chest pain Gastrointestinal: Positive abdominal pain with constipation Genitourinary: Negative for dysuria. Musculoskeletal: Negative for back pain. Skin: Negative for rash. Psychiatric: no mood changes,      ____________________________________________   PHYSICAL EXAM:  VITAL SIGNS: ED Triage Vitals  Enc Vitals Group     BP 07/15/20 1038 137/66     Pulse Rate 07/15/20 1038 77     Resp 07/15/20 1038 18     Temp 07/15/20 1038 98 F (36.7 C)     Temp Source 07/15/20 1038 Oral     SpO2 07/15/20 1038 98 %     Weight 07/15/20 1039 186 lb (84.4 kg)     Height 07/15/20 1039 5\' 2"  (1.575 m)     Head Circumference --      Peak Flow --      Pain Score 07/15/20 1039 8     Pain Loc --      Pain Edu? --      Excl. in GC? --     Constitutional: Alert and oriented. Well appearing and in no acute distress. Eyes: Conjunctivae are normal.  Head: Atraumatic. Nose: No congestion/rhinnorhea. Mouth/Throat: Mucous membranes are moist.   Neck:  supple no lymphadenopathy noted Cardiovascular: Normal rate, regular rhythm.  Respiratory: Normal respiratory effort.  No retractions,  Abd: soft minimally tender bs increased in upper quadrants, decreased in the lower quadrants GU: deferred Musculoskeletal: FROM all extremities, warm and well perfused Neurologic:  Normal speech and language.  Skin:  Skin is warm, dry and intact. No rash noted. Psychiatric: Mood and affect are normal. Speech and behavior are normal.  ____________________________________________   LABS (all labs ordered are listed, but only abnormal results are displayed)  Labs Reviewed - No data to display ____________________________________________   ____________________________________________  RADIOLOGY  X-ray of the abdomen  ____________________________________________   PROCEDURES  Procedure(s) performed: No  Procedures    ____________________________________________   INITIAL IMPRESSION / ASSESSMENT AND PLAN / ED COURSE  Pertinent labs & imaging results that were available during my care of the patient were reviewed by me and considered in my medical decision making (see chart for details).   Patient  80 year old female presents emergency department with concerns of constipation.  See HPI.  Physical exam shows patient appears stable.  DDx: Bowel blockage, constipation, mass in the rectum  X-ray of the abdomen   X-ray of the abdomen reviewed by me confirmed by radiology to have large stool burden.  No obstruction noted  I did explain everything to the patient.  We did a glycerin suppository and gave her mag citrate while here in the ED.  She was able to have a bowel movement.  She was discharged in stable condition.  He was given information on how to prevent constipation.  She is to return if worsening  Nicole Lynn was evaluated in Emergency Department on 07/15/2020 for the symptoms described in the history of present illness. She was evaluated in the context of the global COVID-19 pandemic, which necessitated consideration that the patient might be at risk for infection with the SARS-CoV-2 virus that causes COVID-19. Institutional  protocols and algorithms that pertain to the evaluation of patients at risk for COVID-19 are in a state of rapid change based on information released by regulatory bodies including the CDC and federal and state organizations. These policies and algorithms were followed during the patient's care in the ED.    As part of my medical decision making, I reviewed the following data within the electronic MEDICAL RECORD NUMBER Nursing notes reviewed and incorporated, Old chart reviewed, Radiograph reviewed , Notes from prior ED visits and [place] Controlled Substance Database  ____________________________________________   FINAL CLINICAL IMPRESSION(S) / ED DIAGNOSES  Final diagnoses:  Constipation, unspecified constipation type      NEW MEDICATIONS STARTED DURING THIS VISIT:  New Prescriptions   No medications on file     Note:  This document was prepared using Dragon voice recognition software and may include unintentional dictation errors.    Faythe Ghee, PA-C 07/15/20 1403    Minna Antis, MD 07/15/20 [phone]

## 2020-07-15 NOTE — ED Notes (Signed)
States she has had 2 small and 1 large BM after meds   Provider aware

## 2020-07-28 ENCOUNTER — Other Ambulatory Visit: Payer: Self-pay

## 2020-07-28 ENCOUNTER — Ambulatory Visit (INDEPENDENT_AMBULATORY_CARE_PROVIDER_SITE_OTHER): Payer: Medicare Other | Admitting: Physician Assistant

## 2020-07-28 ENCOUNTER — Encounter: Payer: Self-pay | Admitting: Physician Assistant

## 2020-07-28 VITALS — BP 144/75 | HR 64 | Temp 98.5°F | Resp 18 | Wt 189.0 lb

## 2020-07-28 DIAGNOSIS — R0609 Other forms of dyspnea: Secondary | ICD-10-CM

## 2020-07-28 DIAGNOSIS — R0600 Dyspnea, unspecified: Secondary | ICD-10-CM | POA: Diagnosis not present

## 2020-07-28 DIAGNOSIS — E119 Type 2 diabetes mellitus without complications: Secondary | ICD-10-CM | POA: Diagnosis not present

## 2020-07-28 DIAGNOSIS — K5909 Other constipation: Secondary | ICD-10-CM

## 2020-07-28 NOTE — Progress Notes (Signed)
I,Nicole Lynn,acting as a scribe for [employer], PA-C.,have documented all relevant documentation on the behalf of Nicole Loveless, PA-C,as directed by  Nicole Loveless, PA-C while in the presence of Nicole Loveless, PA-C.   Established patient visit   Patient: Nicole Lynn   DOB: 1940/08/22   80 y.o. Female  MRN: [id number] Visit Date: 07/28/2020  Today's healthcare provider: [name], PA-C   Chief Complaint  Patient presents with  . Follow-up   Subjective    HPI  Follow up ER visit  Patient was seen in ER for constipation on 07/15/2020. She was treated for constipation. Treatment for this included; see notes in chart. She reports good compliance with treatment. She reports this condition is Improved.  -------------------------------------------------------------------  Patient has improved since ER visit.  She does have an acute complaint of DOE. Reports has been progressive. Now noticing having to stop on the stairs half way up to catch her breath. Notices when walking on flat surfaces too. Feels she may can walk 100-200 ft before having to stop and take a deep breath. After the one deep breath she is able to continue.    Patient Active Problem List   Diagnosis Date Noted  . Dizziness 07/10/2020  . Diabetes mellitus (HCC) 06/06/2015  . Anxiety 05/16/2015  . Claustrophobia 05/16/2015  . Fatigue 05/16/2015  . Hypercholesteremia 05/16/2015  . Adiposity 05/16/2015  . Gonalgia 05/16/2015  . Subclinical hypothyroidism 05/16/2015  . History of recurrent UTIs 05/16/2015  . Insomnia 02/28/2015  . Varicose veins of leg with complications 02/04/2014  . Menopausal state   . HTN (hypertension)   . OVERWEIGHT/OBESITY 10/17/2008  . Chest pain 10/17/2008   Past Medical History:  Diagnosis Date  . Anxiety   . Chest pain, unspecified   . HTN (hypertension)   . Menopausal state   . Overweight(278.02)         Medications: Outpatient Medications Prior to Visit  Medication Sig  . atenolol-chlorthalidone (TENORETIC) 50-25 MG tablet Take 1 tablet by mouth daily.  . fish oil-omega-3 fatty acids 1000 MG capsule Take 2 g by mouth daily.  Marland Kitchen glucose blood (ONETOUCH VERIO) test strip Use to check blood sugar once daily  . HYDROcodone-homatropine (HYCODAN) 5-1.5 MG/5ML syrup Hydromet 5 mg-1.5 mg/5 mL oral syrup  TK 5 MLS PO Q 4 TO 6 H PRN F COUGH  . latanoprost (XALATAN) 0.005 % ophthalmic solution   . metFORMIN (GLUCOPHAGE) 500 MG tablet Take 1 tablet (500 mg total) by mouth daily with breakfast.  . Multiple Vitamins-Minerals (CENTRUM SILVER PO) Take by mouth once.  Letta Pate DELICA LANCETS FINE MISC To check blood sugars once a day.  DX: E11.9  . rosuvastatin (CRESTOR) 5 MG tablet TAKE 1 TABLET(5 MG) BY MOUTH DAILY  . zolpidem (AMBIEN) 10 MG tablet Take 1 tablet (10 mg total) by mouth at bedtime as needed for sleep.   No facility-administered medications prior to visit.    Review of Systems  Constitutional: Negative for appetite change, chills, fatigue and fever.  Respiratory: Negative for chest tightness and shortness of breath.   Cardiovascular: Negative for chest pain and palpitations.  Gastrointestinal: Negative for abdominal pain, nausea and vomiting.  Neurological: Negative for dizziness and weakness.    Last CBC Lab Results  Component Value Date   WBC 10.3 07/10/2020   HGB 14.4 07/10/2020   HCT 41.7 07/10/2020   MCV 95 07/10/2020   MCH 32.8 07/10/2020   RDW 12.3  07/10/2020   PLT 240 07/10/2020   Last metabolic panel Lab Results  Component Value Date   GLUCOSE 124 (H) 07/10/2020   NA 136 07/10/2020   K 3.6 07/10/2020   CL 93 (L) 07/10/2020   CO2 25 07/10/2020   BUN 16 07/10/2020   CREATININE 0.71 07/10/2020   GFRNONAA 86 05/05/2020   GFRAA 99 05/05/2020   CALCIUM 9.9 07/10/2020   PROT 6.9 07/10/2020   ALBUMIN 4.2 07/10/2020   LABGLOB 2.7 07/10/2020   AGRATIO 1.6  07/10/2020   BILITOT 0.4 07/10/2020   ALKPHOS 81 07/10/2020   AST 15 07/10/2020   ALT 13 07/10/2020   ANIONGAP 7 06/21/2013       Objective    BP (!) 144/75 (BP Location: Left Arm, Patient Position: Sitting, Cuff Size: Large)   Pulse 64   Temp 98.5 F (36.9 C) (Oral)   Resp 18   Wt 189 lb (85.7 kg)   SpO2 94%   BMI 34.57 kg/m  BP Readings from Last 3 Encounters:  07/28/20 (!) 144/75  07/15/20 137/66  07/10/20 (!) 147/68   Wt Readings from Last 3 Encounters:  07/28/20 189 lb (85.7 kg)  07/15/20 186 lb (84.4 kg)  07/10/20 188 lb 6.4 oz (85.5 kg)       Physical Exam Vitals reviewed.  Constitutional:      General: She is not in acute distress.    Appearance: Normal appearance. She is well-developed and well-groomed. She is obese. She is not ill-appearing or diaphoretic.  Cardiovascular:     Rate and Rhythm: Normal rate and regular rhythm.     Pulses: Normal pulses.     Heart sounds: Normal heart sounds. No murmur heard. No friction rub. No gallop.   Pulmonary:     Effort: Pulmonary effort is normal. No respiratory distress.     Breath sounds: Normal breath sounds. No wheezing or rales.  Musculoskeletal:     Cervical back: Normal range of motion and neck supple.     Right lower leg: No edema.     Left lower leg: No edema.  Skin:    Capillary Refill: Capillary refill takes less than 2 seconds.  Neurological:     General: No focal deficit present.     Mental Status: She is alert. Mental status is at baseline.  Psychiatric:        Mood and Affect: Mood normal.        Behavior: Behavior is cooperative.        Thought Content: Thought content normal.      No results found for any visits on 07/28/20.  Assessment & Plan     1. Other constipation Improved. Patient reports no other issues at this time. Mentioned colonoscopy, but wants to wait and see how she does over the next month or so and may consider referral then.   2. DOE (dyspnea on exertion) EKG today  shows NSR with LAE rate of 63, personally reviewed by me. Again reports she has a vacation coming up. Wants to wait and will consider referral after her vacation if still continuing to have or if worsening.  - EKG 12-Lead  3. Type 2 diabetes mellitus without complication, without long-term current use of insulin (HCC) Patient brought in readings. Previously having wild fluctuations, but now stable. Readings range from 125-165 from 07/11/20-07/27/20.   No follow-ups on file.      Delmer Islam, PA-C, have reviewed all documentation for this visit. The documentation on 07/28/20  for the exam, diagnosis, procedures, and orders are all accurate and complete.   Reine Just  [facility] [phone] (phone) [phone] (fax)  [facility]

## 2020-09-08 DIAGNOSIS — H40153 Residual stage of open-angle glaucoma, bilateral: Secondary | ICD-10-CM | POA: Diagnosis not present

## 2020-09-11 DIAGNOSIS — L219 Seborrheic dermatitis, unspecified: Secondary | ICD-10-CM | POA: Diagnosis not present

## 2020-09-11 DIAGNOSIS — D229 Melanocytic nevi, unspecified: Secondary | ICD-10-CM | POA: Diagnosis not present

## 2020-09-11 DIAGNOSIS — L814 Other melanin hyperpigmentation: Secondary | ICD-10-CM | POA: Diagnosis not present

## 2020-09-11 DIAGNOSIS — Z8659 Personal history of other mental and behavioral disorders: Secondary | ICD-10-CM | POA: Insufficient documentation

## 2020-09-11 DIAGNOSIS — L821 Other seborrheic keratosis: Secondary | ICD-10-CM | POA: Diagnosis not present

## 2020-09-15 ENCOUNTER — Ambulatory Visit (INDEPENDENT_AMBULATORY_CARE_PROVIDER_SITE_OTHER): Payer: Medicare Other | Admitting: Family Medicine

## 2020-09-15 ENCOUNTER — Other Ambulatory Visit: Payer: Self-pay

## 2020-09-15 ENCOUNTER — Encounter: Payer: Self-pay | Admitting: Family Medicine

## 2020-09-15 VITALS — BP 142/62 | HR 80 | Temp 97.3°F | Resp 16 | Ht 60.0 in | Wt 186.0 lb

## 2020-09-15 DIAGNOSIS — E038 Other specified hypothyroidism: Secondary | ICD-10-CM | POA: Diagnosis not present

## 2020-09-15 DIAGNOSIS — Z6836 Body mass index (BMI) 36.0-36.9, adult: Secondary | ICD-10-CM

## 2020-09-15 DIAGNOSIS — R0609 Other forms of dyspnea: Secondary | ICD-10-CM

## 2020-09-15 DIAGNOSIS — F5101 Primary insomnia: Secondary | ICD-10-CM

## 2020-09-15 DIAGNOSIS — E1159 Type 2 diabetes mellitus with other circulatory complications: Secondary | ICD-10-CM

## 2020-09-15 DIAGNOSIS — F40240 Claustrophobia: Secondary | ICD-10-CM

## 2020-09-15 DIAGNOSIS — E669 Obesity, unspecified: Secondary | ICD-10-CM

## 2020-09-15 DIAGNOSIS — R0600 Dyspnea, unspecified: Secondary | ICD-10-CM

## 2020-09-15 DIAGNOSIS — E1169 Type 2 diabetes mellitus with other specified complication: Secondary | ICD-10-CM | POA: Diagnosis not present

## 2020-09-15 DIAGNOSIS — E785 Hyperlipidemia, unspecified: Secondary | ICD-10-CM

## 2020-09-15 DIAGNOSIS — I152 Hypertension secondary to endocrine disorders: Secondary | ICD-10-CM

## 2020-09-15 LAB — POCT GLYCOSYLATED HEMOGLOBIN (HGB A1C)
Est. average glucose Bld gHb Est-mCnc: 154
Hemoglobin A1C: 7 % — AB (ref 4.0–5.6)

## 2020-09-15 NOTE — Assessment & Plan Note (Addendum)
Slightly elevated Thinks this is white coat HTN - advised to monitor home BPs Continue current medications Reviewed recent metabolic panel F/u in 6 months

## 2020-09-15 NOTE — Assessment & Plan Note (Signed)
Reports severe claustrophobia Would need medication for any MRI/CT

## 2020-09-15 NOTE — Assessment & Plan Note (Signed)
Uses medication when travels

## 2020-09-15 NOTE — Progress Notes (Signed)
Established patient visit   Patient: Nicole Lynn   DOB: 12/02/1940   80 y.o. Female  MRN: [id number] Visit Date: 09/15/2020  Today's healthcare provider: Shirlee Latch, MD   Chief Complaint  Patient presents with  . Diabetes  . Hypertension  . Hyperlipidemia   Subjective    Diabetes Pertinent negatives for hypoglycemia include no dizziness or headaches. Pertinent negatives for diabetes include no chest pain and no fatigue.  Hypertension Associated symptoms include shortness of breath. Pertinent negatives include no chest pain, headaches or palpitations.  Hyperlipidemia Associated symptoms include shortness of breath. Pertinent negatives include no chest pain.    Diabetes Mellitus Type II, follow-up  Lab Results  Component Value Date   HGBA1C 7.0 (A) 09/15/2020   HGBA1C 6.9 (A) 07/10/2020   HGBA1C 7.1 (H) 05/05/2020   Last seen for diabetes 6 months ago.  Management since then includes continuing the same treatment. She reports excellent compliance with treatment. She is not having side effects.  Home blood sugar records: fasting range: 120-140's  Episodes of hypoglycemia? No    Current insulin regiment: NONE Most Recent Eye Exam: last week with Dr Alvester Morin  --------------------------------------------------------------------------------------------------- Hypertension, follow-up  BP Readings from Last 3 Encounters:  09/15/20 (!) 142/62  07/28/20 (!) 144/75  07/15/20 137/66   Wt Readings from Last 3 Encounters:  09/15/20 186 lb (84.4 kg)  07/28/20 189 lb (85.7 kg)  07/15/20 186 lb (84.4 kg)     She was last seen for hypertension 6 months ago.  BP at that visit was 124/76. Management since that visit includes NO CHANGES. She reports excellent compliance with treatment. She is not having side effects. She is not exercising. She is adherent to low salt diet.   Outside blood pressures are not being checked.  She does not smoke.  Use of  agents associated with hypertension: none.   --------------------------------------------------------------------------------------------------- Lipid/Cholesterol, follow-up  Last Lipid Panel: Lab Results  Component Value Date   CHOL 173 10/31/2019   LDLCALC 89 10/31/2019   HDL 52 10/31/2019   TRIG 188 (H) 10/31/2019    She was last seen for this 6 months ago.  Management since that visit includes NO CHANGES.  She reports excellent compliance with treatment. She is not having side effects.  Symptoms: No appetite changes No foot ulcerations  No chest pain No chest pressure/discomfort  No dyspnea No orthopnea  No fatigue Yes lower extremity edema  No palpitations No paroxysmal nocturnal dyspnea  No nausea No numbness or tingling of extremity  No polydipsia No polyuria  No speech difficulty No syncope   She is following a Regular diet. Current exercise: gardening  Last metabolic panel Lab Results  Component Value Date   GLUCOSE 124 (H) 07/10/2020   NA 136 07/10/2020   K 3.6 07/10/2020   BUN 16 07/10/2020   CREATININE 0.71 07/10/2020   GFRNONAA 86 05/05/2020   GFRAA 99 05/05/2020   CALCIUM 9.9 07/10/2020   AST 15 07/10/2020   ALT 13 07/10/2020   The 10-year ASCVD risk score Denman George DC Jr., et al., 2013) is: 50.5%  Shortness of Breath She states that ever since she was diagnosed with COVID 19 last spring, she is not able to walk as long as she used to because she experiences shortness of breath.    ---------------------------------------------------------------------------------------------------   Patient Active Problem List   Diagnosis Date Noted  . Dizziness 07/10/2020  . Diabetes mellitus (HCC) 06/06/2015  . Anxiety 05/16/2015  .  Claustrophobia 05/16/2015  . Fatigue 05/16/2015  . Hyperlipidemia associated with type 2 diabetes mellitus (HCC) 05/16/2015  . Obesity 05/16/2015  . Subclinical hypothyroidism 05/16/2015  . History of recurrent UTIs 05/16/2015   . Insomnia 02/28/2015  . Varicose veins of leg with complications 02/04/2014  . Menopausal state   . Hypertension associated with diabetes [facility])    Social History   Tobacco Use  . Smoking status: Never Smoker  . Smokeless tobacco: Never Used  Substance Use Topics  . Alcohol use: Yes    Alcohol/week: 3.0 standard drinks    Types: 3 Glasses of wine per week  . Drug use: No   Allergies  Allergen Reactions  . Latex        Medications: Outpatient Medications Prior to Visit  Medication Sig  . atenolol-chlorthalidone (TENORETIC) 50-25 MG tablet Take 1 tablet by mouth daily.  . fish oil-omega-3 fatty acids 1000 MG capsule Take 2 g by mouth daily.  Marland Kitchen glucose blood (ONETOUCH VERIO) test strip Use to check blood sugar once daily  . HYDROcodone-homatropine (HYCODAN) 5-1.5 MG/5ML syrup Hydromet 5 mg-1.5 mg/5 mL oral syrup  TK 5 MLS PO Q 4 TO 6 H PRN F COUGH  . latanoprost (XALATAN) 0.005 % ophthalmic solution   . metFORMIN (GLUCOPHAGE) 500 MG tablet Take 1 tablet (500 mg total) by mouth daily with breakfast.  . Multiple Vitamins-Minerals (CENTRUM SILVER PO) Take by mouth once.  Letta Pate DELICA LANCETS FINE MISC To check blood sugars once a day.  DX: E11.9  . rosuvastatin (CRESTOR) 5 MG tablet TAKE 1 TABLET(5 MG) BY MOUTH DAILY  . zolpidem (AMBIEN) 10 MG tablet Take 1 tablet (10 mg total) by mouth at bedtime as needed for sleep.   No facility-administered medications prior to visit.    Review of Systems  Constitutional: Negative for activity change, appetite change, chills, fatigue and fever.  HENT: Negative for congestion, ear pain, rhinorrhea, sinus pain and sore throat.   Respiratory: Positive for shortness of breath. Negative for cough, chest tightness and wheezing.   Cardiovascular: Positive for leg swelling. Negative for chest pain and palpitations.  Gastrointestinal: Negative for abdominal pain, blood in stool, diarrhea, nausea and vomiting.  Genitourinary: Negative for  dysuria, flank pain, frequency and urgency.  Musculoskeletal: Positive for arthralgias.  Neurological: Negative for dizziness and headaches.       Objective    BP (!) 142/62 (BP Location: Right Arm, Patient Position: Sitting, Cuff Size: Large)   Pulse 80   Temp (!) 97.3 F (36.3 C) (Oral)   Resp 16   Ht 5' (1.524 m)   Wt 186 lb (84.4 kg)   SpO2 97%   BMI 36.33 kg/m  BP Readings from Last 3 Encounters:  09/15/20 (!) 142/62  07/28/20 (!) 144/75  07/15/20 137/66   Wt Readings from Last 3 Encounters:  09/15/20 186 lb (84.4 kg)  07/28/20 189 lb (85.7 kg)  07/15/20 186 lb (84.4 kg)       Physical Exam Vitals reviewed.  Constitutional:      General: She is not in acute distress.    Appearance: Normal appearance. She is well-developed. She is not diaphoretic.  HENT:     Head: Normocephalic and atraumatic.  Eyes:     General: No scleral icterus.    Conjunctiva/sclera: Conjunctivae normal.  Neck:     Thyroid: No thyromegaly.  Cardiovascular:     Rate and Rhythm: Normal rate and regular rhythm.     Pulses: Normal pulses.  Heart sounds: Normal heart sounds. No murmur heard.   Pulmonary:     Effort: Pulmonary effort is normal. No respiratory distress.     Breath sounds: Normal breath sounds. No wheezing, rhonchi or rales.  Musculoskeletal:     Cervical back: Neck supple.     Right lower leg: No edema.     Left lower leg: No edema.  Lymphadenopathy:     Cervical: No cervical adenopathy.  Skin:    General: Skin is warm and dry.     Findings: No rash.  Neurological:     Mental Status: She is alert and oriented to person, place, and time. Mental status is at baseline.  Psychiatric:        Mood and Affect: Mood normal.        Behavior: Behavior normal.      Results for orders placed or performed in visit on 09/15/20  POCT glycosylated hemoglobin (Hb A1C)  Result Value Ref Range   Hemoglobin A1C 7.0 (A) 4.0 - 5.6 %   Est. average glucose Bld gHb Est-mCnc 154      Assessment & Plan     Problem List Items Addressed This Visit      Cardiovascular and Mediastinum   Hypertension associated with diabetes (HCC) - Primary    Slightly elevated Thinks this is white coat HTN - advised to monitor home BPs Continue current medications Reviewed recent metabolic panel F/u in 6 months         Endocrine   Hyperlipidemia associated with type 2 diabetes mellitus (HCC)    Previously well controlled Continue statin Repeat FLP and CMP at next visit Goal LDL < 70      Subclinical hypothyroidism    Asymptomatic Reviewed last TSH      Diabetes mellitus (HCC)    Well controlled (higher A1c goal due to advanced age) Associated with HTN and HLD Continue current medications UTD on vaccines, eye exam (ROI sent), foot exam On Statin Discussed diet and exercise F/u in 6 months       Relevant Orders   POCT glycosylated hemoglobin (Hb A1C) (Completed)     Other   Insomnia    Uses medication when travels      Claustrophobia    Reports severe claustrophobia Would need medication for any MRI/CT      Obesity    Discussed importance of healthy weight management Discussed diet and exercise        Other Visit Diagnoses    DOE (dyspnea on exertion)       Relevant Orders   Ambulatory referral to Cardiology       Return in about 3 months (around 12/16/2020) for chronic disease f/u. with new PCP     Caprice Beaver as a scribe for Shirlee Latch, MD.,have documented all relevant documentation on the behalf of Shirlee Latch, MD,as directed by  Shirlee Latch, MD while in the presence of Shirlee Latch, MD.  I, Shirlee Latch, MD, have reviewed all documentation for this visit. The documentation on 09/15/20 for the exam, diagnosis, procedures, and orders are all accurate and complete.   Devri Kreher, Marzella Schlein, MD, MPH [facility]

## 2020-09-15 NOTE — Assessment & Plan Note (Signed)
Discussed importance of healthy weight management Discussed diet and exercise  

## 2020-09-15 NOTE — Assessment & Plan Note (Addendum)
Previously well controlled Continue statin Repeat FLP and CMP at next visit Goal LDL < 70  

## 2020-09-15 NOTE — Assessment & Plan Note (Addendum)
Asymptomatic Reviewed last TSH

## 2020-09-15 NOTE — Assessment & Plan Note (Signed)
Well controlled (higher A1c goal due to advanced age) Associated with HTN and HLD Continue current medications UTD on vaccines, eye exam (ROI sent), foot exam On Statin Discussed diet and exercise F/u in 6 months

## 2020-09-15 NOTE — Patient Instructions (Signed)
Blood Pressure Record Sheet To take your blood pressure, you will need a blood pressure machine. You can buy a blood pressure machine (blood pressure monitor) at your clinic, drug store, or online. When choosing one, consider:  An automatic monitor that has an arm cuff.  A cuff that wraps snugly around your upper arm. You should be able to fit only one finger between your arm and the cuff.  A device that stores blood pressure reading results.  Do not choose a monitor that measures your blood pressure from your wrist or finger. Follow your health care provider's instructions for how to take your blood pressure. To use this form:  Get one reading in the morning (a.m.) before you take any medicines.  Get one reading in the evening (p.m.) before supper.  Take at least 2 readings with each blood pressure check. This makes sure the results are correct. Wait 1-2 minutes between measurements.  Write down the results in the spaces on this form.  Repeat this once a week, or as told by your health care provider.  Make a follow-up appointment with your health care provider to discuss the results. Blood pressure log Date: _______________________  a.m. _____________________(1st reading) _____________________(2nd reading)  p.m. _____________________(1st reading) _____________________(2nd reading) Date: _______________________  a.m. _____________________(1st reading) _____________________(2nd reading)  p.m. _____________________(1st reading) _____________________(2nd reading) Date: _______________________  a.m. _____________________(1st reading) _____________________(2nd reading)  p.m. _____________________(1st reading) _____________________(2nd reading) Date: _______________________  a.m. _____________________(1st reading) _____________________(2nd reading)  p.m. _____________________(1st reading) _____________________(2nd reading) Date: _______________________  a.m.  _____________________(1st reading) _____________________(2nd reading)  p.m. _____________________(1st reading) _____________________(2nd reading) This information is not intended to replace advice given to you by your health care provider. Make sure you discuss any questions you have with your health care provider. Document Revised: 08/01/2019 Document Reviewed: 08/01/2019 Elsevier Patient Education  2021 Elsevier Inc.  

## 2020-10-01 DIAGNOSIS — M79641 Pain in right hand: Secondary | ICD-10-CM | POA: Insufficient documentation

## 2020-10-01 DIAGNOSIS — M65342 Trigger finger, left ring finger: Secondary | ICD-10-CM | POA: Insufficient documentation

## 2020-10-01 DIAGNOSIS — M79642 Pain in left hand: Secondary | ICD-10-CM | POA: Diagnosis not present

## 2020-10-01 DIAGNOSIS — M19042 Primary osteoarthritis, left hand: Secondary | ICD-10-CM | POA: Insufficient documentation

## 2020-10-01 DIAGNOSIS — M65341 Trigger finger, right ring finger: Secondary | ICD-10-CM | POA: Diagnosis not present

## 2020-10-01 DIAGNOSIS — M180 Bilateral primary osteoarthritis of first carpometacarpal joints: Secondary | ICD-10-CM | POA: Diagnosis not present

## 2020-11-05 ENCOUNTER — Other Ambulatory Visit: Payer: Self-pay

## 2020-11-05 ENCOUNTER — Encounter: Payer: Self-pay | Admitting: Cardiovascular Disease

## 2020-11-05 ENCOUNTER — Ambulatory Visit: Payer: Medicare Other | Admitting: Cardiovascular Disease

## 2020-11-05 VITALS — BP 160/70 | HR 75 | Ht 61.0 in | Wt 188.4 lb

## 2020-11-05 DIAGNOSIS — E669 Obesity, unspecified: Secondary | ICD-10-CM | POA: Diagnosis not present

## 2020-11-05 DIAGNOSIS — R0602 Shortness of breath: Secondary | ICD-10-CM | POA: Diagnosis not present

## 2020-11-05 DIAGNOSIS — E119 Type 2 diabetes mellitus without complications: Secondary | ICD-10-CM

## 2020-11-05 DIAGNOSIS — I152 Hypertension secondary to endocrine disorders: Secondary | ICD-10-CM | POA: Diagnosis not present

## 2020-11-05 DIAGNOSIS — E1159 Type 2 diabetes mellitus with other circulatory complications: Secondary | ICD-10-CM | POA: Diagnosis not present

## 2020-11-05 DIAGNOSIS — Z794 Long term (current) use of insulin: Secondary | ICD-10-CM

## 2020-11-05 NOTE — Progress Notes (Addendum)
Cardiology Office Note  Date:  11/05/2020   ID:  Meggan Dhaliwal, DOB November 19, 1940, MRN [id number]  PCP:  [name], PA-C   Chief Complaint  Patient presents with   New Patient (Initial Visit)    Ref by Leodis Sias, PA-C for shortness of breath on exertion. Medications reviewed by the patient verbally.     HPI:  Ms Nicole Lynn is a 80 year old woman with past medical history of Diabetes Hypertension Hyperlipidemia Presenting by referral from [facility] for evaluation of her SOB  At baseline before COVID reports that she was active, able to walk 1 or 2 miles without having to stop Like to spend time working in her garden could work for hours at a time  since she was diagnosed with COVID 19 2021 she is not able to walk as long as she used to because she experiences shortness of breath  Weight has trended up, sedentary for the past 2 years Now working in her garden has to stop after a couple of hours cannot keep going Feels her endurance is less Helps to take care of 4 friends who are elderly, sick Able to do her ADLs, otherwise very active at baseline  Off crestor, does not want to be on cholesterol medication  Lab work reviewed A1C 7.0  No prior cardiac studies Reports that she is very claustrophobic, does not like CT scans, stress testing etc.  EKG personally reviewed by myself on todays visit Shows normal sinus rhythm rate 66 beats per minute no significant ST-T wave changes  PMH:   has a past medical history of Anxiety, Chest pain, unspecified, HTN (hypertension), Menopausal state, and Overweight(278.02).  PSH:    Past Surgical History:  Procedure Laterality Date   ABDOMINAL HYSTERECTOMY  2002   TAH/BSO   APPENDECTOMY  1959   BILATERAL SALPINGOOPHORECTOMY      Current Outpatient Medications  Medication Sig Dispense Refill   atenolol-chlorthalidone (TENORETIC) 50-25 MG tablet Take 1 tablet by mouth daily. 90 tablet 3   fish  oil-omega-3 fatty acids 1000 MG capsule Take 2 g by mouth daily.     latanoprost (XALATAN) 0.005 % ophthalmic solution Place 1 drop into both eyes at bedtime.  0   metFORMIN (GLUCOPHAGE) 500 MG tablet Take 1 tablet (500 mg total) by mouth daily with breakfast. 90 tablet 3   Multiple Vitamins-Minerals (CENTRUM SILVER PO) Take by mouth once.     zolpidem (AMBIEN) 10 MG tablet Take 1 tablet (10 mg total) by mouth at bedtime as needed for sleep. 30 tablet 5   glucose blood (ONETOUCH VERIO) test strip Use to check blood sugar once daily (Patient not taking: Reported on 11/05/2020) 100 each 11   HYDROcodone-homatropine (HYCODAN) 5-1.5 MG/5ML syrup Hydromet 5 mg-1.5 mg/5 mL oral syrup  TK 5 MLS PO Q 4 TO 6 H PRN F COUGH (Patient not taking: Reported on 11/05/2020)     ONETOUCH DELICA LANCETS FINE MISC To check blood sugars once a day.  DX: E11.9 (Patient not taking: Reported on 11/05/2020) 100 each 1   rosuvastatin (CRESTOR) 5 MG tablet TAKE 1 TABLET(5 MG) BY MOUTH DAILY (Patient not taking: Reported on 11/05/2020) 90 tablet 3   No current facility-administered medications for this visit.     Allergies:   Latex   Social History:  The patient  reports that she has never smoked. She has never used smokeless tobacco. She reports current alcohol use of about 3.0 standard drinks of alcohol per week. She  reports that she does not use drugs.   Family History:   family history includes Cancer in an other family member; Coronary artery disease in an other family member; Prostate cancer in her father; Stroke in her mother and another family member.    Review of Systems: Review of Systems  Constitutional: Negative.   HENT: Negative.    Respiratory:  Positive for shortness of breath.   Cardiovascular: Negative.   Gastrointestinal: Negative.   Musculoskeletal: Negative.   Neurological: Negative.   Psychiatric/Behavioral: Negative.    All other systems reviewed and are negative.   PHYSICAL EXAM: VS:  BP  (!) 160/70 (BP Location: Right Arm, Patient Position: Sitting, Cuff Size: Normal)   Pulse 75   Ht 5\' 1"  (1.549 m)   Wt 188 lb 6 oz (85.4 kg)   SpO2 98%   BMI 35.59 kg/m  , BMI Body mass index is 35.59 kg/m. GEN: Obese, well nourished, well developed, in no acute distress HEENT: normal Neck: no JVD, carotid bruits, or masses Cardiac: RRR; no murmurs, rubs, or gallops,no edema  Respiratory:  clear to auscultation bilaterally, normal work of breathing GI: soft, nontender, nondistended, + BS MS: no deformity or atrophy Skin: warm and dry, no rash Neuro:  Strength and sensation are intact Psych: euthymic mood, full affect   Recent Labs: 07/10/2020: ALT 13; BUN 16; Creatinine, Ser 0.71; Hemoglobin 14.4; Platelets 240; Potassium 3.6; Sodium 136; TSH 3.900    Lipid Panel Lab Results  Component Value Date   CHOL 173 10/31/2019   HDL 52 10/31/2019   LDLCALC 89 10/31/2019   TRIG 188 (H) 10/31/2019      Wt Readings from Last 3 Encounters:  11/05/20 188 lb 6 oz (85.4 kg)  09/15/20 186 lb (84.4 kg)  07/28/20 189 lb (85.7 kg)     ASSESSMENT AND PLAN:  Problem List Items Addressed This Visit       Cardiology Problems   Hypertension associated with diabetes (HCC) - Primary   Relevant Orders   EKG 12-Lead     Other   Diabetes mellitus (HCC)   Other Visit Diagnoses     Shortness of breath       Relevant Orders   ECHOCARDIOGRAM COMPLETE   Obesity (BMI 35.0-39.9 without comorbidity)          Shortness of breath Etiology unclear, possibly from conditioning, obesity She did have COVID (she feels), unable to exclude COVID-related symptoms Unable to rule out ischemia, few risk factors Recommend we start with echocardiogram to evaluate ejection fraction, rule out valvular heart disease and pulmonary hypertension Recommend she continue a regular walking or exercise program  Obesity We have encouraged continued exercise, careful diet management in an effort to lose  weight.  Essential hypertension Does not have blood pressure cuff at home, numbers running high She is concerned it is whitecoat syndrome, stressed about today's visit Recommend she consider buying a blood pressure cuff, checking at home and calling 03-26-1990 with numbers    Total encounter time more than 60 minutes  Greater than 50% was spent in counseling and coordination of care with the patient    Signed, [place], M.D., Ph.D. [facility] [place], [place]

## 2020-11-05 NOTE — Patient Instructions (Addendum)
Keep walking  Medication Instructions:  No changes  If you need a refill on your cardiac medications before your next appointment, please call your pharmacy.   Lab work: No new labs needed  Testing/Procedures: Echo for shortness of breath   Follow-Up: At [facility], you and your health needs are our priority.  As part of our continuing mission to provide you with exceptional heart care, we have created designated Provider Care Teams.  These Care Teams include your primary Cardiologist (physician) and Advanced Practice Providers (APPs -  Physician Assistants and Nurse Practitioners) who all work together to provide you with the care you need, when you need it.  You will need a follow up appointment as needed (we will call with echo results)  Providers on your designated Care Team:   Nicolasa Ducking, NP Eula Listen, PA-C Marisue Ivan, PA-C [place], [place]  COVID-19 Vaccine Information can be found at: [email or web address] For questions related to vaccine distribution or appointments, please email [email or web address] or call [phone].    Please monitor blood pressures and keep a log of your readings.  Call the clinic in 2 weeks with BP readings.   How to use a home blood pressure monitor. Be still. Measure at the same time every day. It's important to take the readings at the same time each day, such as morning and evening. Take reading approximately 1 1/2 to 2 hours after BP medications.   AVOID these things for 30 minutes before checking your blood pressure: Drinking caffeine. Drinking alcohol. Eating. Smoking. Exercising.   Five minutes before checking your blood pressure: Pee. Sit in a dining chair. Avoid sitting in a soft couch or armchair. Be quiet. Do not talk.       Sit correctly. Sit with your back straight and supported (on a dining chair, rather than a sofa). Your feet should be flat on  the floor and your legs should not be crossed. Your arm should be supported on a flat surface (such as a table) with the upper arm at heart level. Make sure the bottom of the cuff is placed directly above the bend of the elbow.    Your physician has requested that you have an echocardiogram. Echocardiography is a painless test that uses sound waves to create images of your heart. It provides your doctor with information about the size and shape of your heart and how well your heart's chambers and valves are working. This procedure takes approximately one hour. There are no restrictions for this procedure.  There is a possibility that an IV may need to be started during your test to inject an image enhancing agent. This is done to obtain more optimal pictures of your heart. Therefore we ask that you do at least drink some water prior to coming in to hydrate your veins.

## 2020-11-10 ENCOUNTER — Encounter: Payer: Self-pay | Admitting: Physician Assistant

## 2020-11-10 DIAGNOSIS — M65342 Trigger finger, left ring finger: Secondary | ICD-10-CM | POA: Diagnosis not present

## 2020-11-10 DIAGNOSIS — M65341 Trigger finger, right ring finger: Secondary | ICD-10-CM | POA: Diagnosis not present

## 2020-11-13 ENCOUNTER — Telehealth: Payer: Self-pay | Admitting: Physician Assistant

## 2020-11-13 DIAGNOSIS — E119 Type 2 diabetes mellitus without complications: Secondary | ICD-10-CM

## 2020-11-13 NOTE — Telephone Encounter (Signed)
[facility] faxed refill request for the following medications:   metFORMIN (GLUCOPHAGE) 500 MG tablet    Please advise.  

## 2020-11-14 MED ORDER — METFORMIN HCL 500 MG PO TABS: 500.0000 mg | ORAL_TABLET | Freq: Every day | ORAL | 3 refills | Status: DC

## 2020-11-14 NOTE — Telephone Encounter (Signed)
Refilled medication. LOV 09/15/20. F/U in 83mos.

## 2020-11-29 ENCOUNTER — Telehealth: Payer: Self-pay | Admitting: Family Medicine

## 2020-11-29 DIAGNOSIS — I10 Essential (primary) hypertension: Secondary | ICD-10-CM

## 2020-11-29 NOTE — Telephone Encounter (Signed)
Requested Prescriptions  Pending Prescriptions Disp Refills  . atenolol-chlorthalidone (TENORETIC) 50-25 MG tablet [Pharmacy Med Name: ATENOLOL/CHLORTHALIDONE 50/25 TABS] 90 tablet 1    Sig: TAKE 1 TABLET BY MOUTH EVERY DAY     Cardiovascular: Beta Blocker + Diuretic Combos Failed - 11/29/2020  1:46 PM      Failed - Last BP in normal range    BP Readings from Last 1 Encounters:  11/05/20 (!) 160/70         Passed - K in normal range and within 180 days    Potassium  Date Value Ref Range Status  07/10/2020 3.6 3.5 - 5.2 mmol/L Final  06/21/2013 4.2 3.5 - 5.1 mmol/L Final         Passed - Na in normal range and within 180 days    Sodium  Date Value Ref Range Status  07/10/2020 136 134 - 144 mmol/L Final  06/21/2013 138 136 - 145 mmol/L Final         Passed - Cr in normal range and within 180 days    Creatinine  Date Value Ref Range Status  06/21/2013 0.54 (L) 0.60 - 1.30 mg/dL Final   Creatinine, Ser  Date Value Ref Range Status  07/10/2020 0.71 0.57 - 1.00 mg/dL Final         Passed - Ca in normal range and within 180 days    Calcium  Date Value Ref Range Status  07/10/2020 9.9 8.7 - 10.3 mg/dL Final   Calcium, Total  Date Value Ref Range Status  06/21/2013 8.5 8.5 - 10.1 mg/dL Final         Passed - Patient is not pregnant      Passed - Last Heart Rate in normal range    Pulse Readings from Last 1 Encounters:  11/05/20 75         Passed - Valid encounter within last 6 months    Recent Outpatient Visits          2 months ago Hypertension associated with diabetes [facility])   [facility] [place], Marzella Schlein, MD   4 months ago Other constipation   [facility] [place], [place], [place]   4 months ago Type 2 diabetes mellitus with hyperglycemia, without long-term current use of insulin [facility])   [facility] [place], Eula Fried, FNP   6 months ago Primary hypertension   [facility] [place], [place],  [place]   1 year ago Type 2 diabetes mellitus without complication, without long-term current use of insulin [facility])   [facility] [place], [place], [place]

## 2020-12-04 ENCOUNTER — Telehealth: Payer: Self-pay

## 2020-12-04 NOTE — Telephone Encounter (Signed)
[facility] faxed refill request for the following medications:    atenolol-chlorthalidone (TENORETIC) 50-25 MG tablet    Please advise.

## 2020-12-09 NOTE — Telephone Encounter (Signed)
[facility] faxed refill request for the following medications:    atenolol-chlorthalidone (TENORETIC) 50-25 MG tablet    Please advise.  

## 2020-12-17 DIAGNOSIS — Z1231 Encounter for screening mammogram for malignant neoplasm of breast: Secondary | ICD-10-CM | POA: Diagnosis not present

## 2020-12-17 DIAGNOSIS — Z1211 Encounter for screening for malignant neoplasm of colon: Secondary | ICD-10-CM | POA: Diagnosis not present

## 2020-12-17 DIAGNOSIS — Z01419 Encounter for gynecological examination (general) (routine) without abnormal findings: Secondary | ICD-10-CM | POA: Diagnosis not present

## 2020-12-17 DIAGNOSIS — L309 Dermatitis, unspecified: Secondary | ICD-10-CM | POA: Diagnosis not present

## 2020-12-19 ENCOUNTER — Other Ambulatory Visit: Payer: Self-pay

## 2020-12-19 ENCOUNTER — Ambulatory Visit (INDEPENDENT_AMBULATORY_CARE_PROVIDER_SITE_OTHER): Payer: Medicare Other

## 2020-12-19 DIAGNOSIS — R0602 Shortness of breath: Secondary | ICD-10-CM

## 2020-12-19 LAB — ECHOCARDIOGRAM COMPLETE
AR max vel: 2.08 cm2
AV Area VTI: 2.56 cm2
AV Area mean vel: 2.32 cm2
AV Mean grad: 5 mmHg
AV Peak grad: 10.5 mmHg
Ao pk vel: 1.62 m/s
Area-P 1/2: 2.17 cm2
Calc EF: 64.8 %
S' Lateral: 2.7 cm
Single Plane A2C EF: 64.5 %
Single Plane A4C EF: 62.2 %

## 2020-12-25 ENCOUNTER — Telehealth: Payer: Self-pay | Admitting: Cardiovascular Disease

## 2020-12-25 NOTE — Telephone Encounter (Signed)
Please call to discuss echocardiogram results.  

## 2020-12-26 NOTE — Telephone Encounter (Signed)
Was able to call Mrs. Nicole Lynn and review preliminary results reviewed by nurse of pt's recent ECHO. Advised pt should expect a call back next week with final results report after Dr. Mariah Milling has reviewed and gives recommendations.   [name] thankful for the phone call and preliminary results, will await call back.

## 2021-01-06 ENCOUNTER — Telehealth: Payer: Self-pay

## 2021-01-06 NOTE — Telephone Encounter (Signed)
Left detail message on VM of pt's recent results okay by DPR, Dr. Mariah Milling advised   "Echocardiogram  Normal LV function  No significant valve disease, normal pressures "  At this time, no further recommendations or medications changes, advised to call office for any concerns or questions, otherwise will see at next visit.

## 2021-01-08 DIAGNOSIS — Z1211 Encounter for screening for malignant neoplasm of colon: Secondary | ICD-10-CM | POA: Diagnosis not present

## 2021-01-14 LAB — COLOGUARD: COLOGUARD: NEGATIVE

## 2021-01-19 DIAGNOSIS — H524 Presbyopia: Secondary | ICD-10-CM | POA: Diagnosis not present

## 2021-01-19 DIAGNOSIS — H40153 Residual stage of open-angle glaucoma, bilateral: Secondary | ICD-10-CM | POA: Diagnosis not present

## 2021-01-19 DIAGNOSIS — E113393 Type 2 diabetes mellitus with moderate nonproliferative diabetic retinopathy without macular edema, bilateral: Secondary | ICD-10-CM | POA: Diagnosis not present

## 2021-01-26 DIAGNOSIS — M65341 Trigger finger, right ring finger: Secondary | ICD-10-CM | POA: Diagnosis not present

## 2021-03-09 DIAGNOSIS — M65342 Trigger finger, left ring finger: Secondary | ICD-10-CM | POA: Diagnosis not present

## 2021-03-09 DIAGNOSIS — M65341 Trigger finger, right ring finger: Secondary | ICD-10-CM | POA: Diagnosis not present

## 2021-05-12 DIAGNOSIS — H40153 Residual stage of open-angle glaucoma, bilateral: Secondary | ICD-10-CM | POA: Diagnosis not present

## 2021-05-15 ENCOUNTER — Other Ambulatory Visit: Payer: Self-pay | Admitting: Family Medicine

## 2021-05-15 DIAGNOSIS — I10 Essential (primary) hypertension: Secondary | ICD-10-CM

## 2021-05-15 NOTE — Telephone Encounter (Signed)
. °  Requested Prescriptions  Pending Prescriptions Disp Refills   atenolol-chlorthalidone (TENORETIC) 50-25 MG tablet [Pharmacy Med Name: ATENOLOL/CHLORTHALIDONE 50/25 TABS] 30 tablet 0    Sig: TAKE 1 TABLET BY MOUTH EVERY DAY     Cardiovascular: Beta Blocker + Diuretic Combos Failed - 05/15/2021  6:36 AM      Failed - K in normal range and within 180 days    Potassium  Date Value Ref Range Status  07/10/2020 3.6 3.5 - 5.2 mmol/L Final  06/21/2013 4.2 3.5 - 5.1 mmol/L Final         Failed - Na in normal range and within 180 days    Sodium  Date Value Ref Range Status  07/10/2020 136 134 - 144 mmol/L Final  06/21/2013 138 136 - 145 mmol/L Final         Failed - Cr in normal range and within 180 days    Creatinine  Date Value Ref Range Status  06/21/2013 0.54 (L) 0.60 - 1.30 mg/dL Final   Creatinine, Ser  Date Value Ref Range Status  07/10/2020 0.71 0.57 - 1.00 mg/dL Final         Failed - Ca in normal range and within 180 days    Calcium  Date Value Ref Range Status  07/10/2020 9.9 8.7 - 10.3 mg/dL Final   Calcium, Total  Date Value Ref Range Status  06/21/2013 8.5 8.5 - 10.1 mg/dL Final         Failed - Last BP in normal range    BP Readings from Last 1 Encounters:  11/05/20 (!) 160/70         Failed - Valid encounter within last 6 months    Recent Outpatient Visits          8 months ago Hypertension associated with diabetes [facility])   [facility] [place], Marzella Schlein, MD   9 months ago Other constipation   [facility] Joycelyn Man M, [place]   10 months ago Type 2 diabetes mellitus with hyperglycemia, without long-term current use of insulin [facility])   [facility] [place], Eula Fried, FNP   1 year ago Primary hypertension   [facility] [place], [place], [place]   1 year ago Type 2 diabetes mellitus without complication, without long-term current use of insulin [facility])   [facility]  [place], [place], [place] - Patient is not pregnant      Passed - Last Heart Rate in normal range    Pulse Readings from Last 1 Encounters:  11/05/20 75

## 2021-06-03 ENCOUNTER — Other Ambulatory Visit: Payer: Self-pay | Admitting: Family Medicine

## 2021-06-03 DIAGNOSIS — I10 Essential (primary) hypertension: Secondary | ICD-10-CM

## 2021-06-03 NOTE — Telephone Encounter (Signed)
Requested medication (s) are due for refill today: requesting for 90 day supply   Requested medication (s) are on the active medication list: yes  Last refill:  05/15/21 #30 0 refills  Future visit scheduled: yes in 1 week   Notes to clinic:  patient requesting 90 day supply due to going out of country to [place] in March. Scheduled appt in 1 week. Due to courtesy refill but requesting 90 day supply      Requested Prescriptions  Pending Prescriptions Disp Refills   atenolol-chlorthalidone (TENORETIC) 50-25 MG tablet [Pharmacy Med Name: ATENOLOL/CHLORTHALIDONE 50/25 TABS] 90 tablet     Sig: TAKE 1 TABLET BY MOUTH EVERY DAY     Cardiovascular: Beta Blocker + Diuretic Combos Failed - 06/03/2021  1:04 PM      Failed - K in normal range and within 180 days    Potassium  Date Value Ref Range Status  07/10/2020 3.6 3.5 - 5.2 mmol/L Final  06/21/2013 4.2 3.5 - 5.1 mmol/L Final          Failed - Na in normal range and within 180 days    Sodium  Date Value Ref Range Status  07/10/2020 136 134 - 144 mmol/L Final  06/21/2013 138 136 - 145 mmol/L Final          Failed - Cr in normal range and within 180 days    Creatinine  Date Value Ref Range Status  06/21/2013 0.54 (L) 0.60 - 1.30 mg/dL Final   Creatinine, Ser  Date Value Ref Range Status  07/10/2020 0.71 0.57 - 1.00 mg/dL Final          Failed - eGFR in normal range and within 180 days    EGFR (African American)  Date Value Ref Range Status  06/21/2013 >60  Final   GFR calc Af Amer  Date Value Ref Range Status  05/05/2020 99 >59 mL/min/1.73 Final    Comment:    **In accordance with recommendations from the NKF-ASN Task force,**   [facility] is in the process of updating its eGFR calculation to the   2021 CKD-EPI creatinine equation that estimates kidney function   without a race variable.    EGFR (Non-African Amer.)  Date Value Ref Range Status  06/21/2013 >60  Final    Comment:    eGFR values <49m/min/1.73 m2 may be  an indication of chronic kidney disease (CKD). Calculated eGFR is useful in patients with stable renal function. The eGFR calculation will not be reliable in acutely ill patients when serum creatinine is changing rapidly. It is not useful in  patients on dialysis. The eGFR calculation may not be applicable to patients at the low and high extremes of body sizes, pregnant women, and vegetarians. potassium - Slight hemolysis, interpret results with  - caution.    GFR calc non Af Amer  Date Value Ref Range Status  05/05/2020 86 >59 mL/min/1.73 Final   eGFR  Date Value Ref Range Status  07/10/2020 88 >59 mL/min/1.73 Final          Failed - Last BP in normal range    BP Readings from Last 1 Encounters:  11/05/20 (!) 160/70          Failed - Valid encounter within last 6 months    Recent Outpatient Visits           8 months ago Hypertension associated with diabetes ([facility]   [place]   10 months ago Other constipation  [place], PA-C   10 months ago Type 2 diabetes mellitus with hyperglycemia, without long-term current use of insulin [facility])   [place], Kelby Aline, FNP   1 year ago Primary hypertension   [place], [place], PA-C   1 year ago Type 2 diabetes mellitus without complication, without long-term current use of insulin [facility])   [place], [place]       Future Appointments             In 1 week [place], [place], [facility]            Passed - Last Heart Rate in normal range    Pulse Readings from Last 1 Encounters:  11/05/20 75

## 2021-06-16 ENCOUNTER — Ambulatory Visit (INDEPENDENT_AMBULATORY_CARE_PROVIDER_SITE_OTHER): Payer: Medicare Other | Admitting: Physician Assistant

## 2021-06-16 ENCOUNTER — Other Ambulatory Visit: Payer: Self-pay

## 2021-06-16 ENCOUNTER — Encounter: Payer: Self-pay | Admitting: Physician Assistant

## 2021-06-16 DIAGNOSIS — E119 Type 2 diabetes mellitus without complications: Secondary | ICD-10-CM

## 2021-06-16 DIAGNOSIS — E038 Other specified hypothyroidism: Secondary | ICD-10-CM | POA: Diagnosis not present

## 2021-06-16 DIAGNOSIS — I10 Essential (primary) hypertension: Secondary | ICD-10-CM

## 2021-06-16 DIAGNOSIS — F5101 Primary insomnia: Secondary | ICD-10-CM | POA: Diagnosis not present

## 2021-06-16 DIAGNOSIS — E1169 Type 2 diabetes mellitus with other specified complication: Secondary | ICD-10-CM

## 2021-06-16 DIAGNOSIS — E785 Hyperlipidemia, unspecified: Secondary | ICD-10-CM

## 2021-06-16 MED ORDER — ZOLPIDEM TARTRATE 10 MG PO TABS: 10.0000 mg | ORAL_TABLET | Freq: Every evening | ORAL | 2 refills | Status: DC | PRN

## 2021-06-16 MED ORDER — METFORMIN HCL 500 MG PO TABS: 500.0000 mg | ORAL_TABLET | Freq: Every day | ORAL | 3 refills | Status: DC

## 2021-06-16 MED ORDER — ATENOLOL-CHLORTHALIDONE 50-25 MG PO TABS
1.0000 | ORAL_TABLET | Freq: Every day | ORAL | 0 refills | Status: DC
Start: 2021-06-16 — End: 2021-08-14

## 2021-06-16 NOTE — Progress Notes (Signed)
Established Patient Office Visit  Subjective:  Patient ID: Nicole Lynn, female    DOB: 1940/09/19  Age: 81 y.o. MRN: [id number]  CC:  Chief Complaint  Patient presents with   Hypertension   Diabetes   Insomnia    Patient states that she will be traveling to [place] in 2 weeks and will need refill today on Ambien. Patient reports good compliance and tolerance on medication.    HPI Nicole Lynn presents for 6 mo follow-up Hypertension, follow-up  BP Readings from Last 3 Encounters:  06/16/21 (!) 143/69  11/05/20 (!) 160/70  09/15/20 (!) 142/62   Wt Readings from Last 3 Encounters:  06/16/21 187 lb 14.4 oz (85.2 kg)  11/05/20 188 lb 6 oz (85.4 kg)  09/15/20 186 lb (84.4 kg)     She was last seen for hypertension 5 months ago.  BP at that visit was see above. Management since that visit includes Tenoretic.  She reports fair compliance with treatment. She is not having side effects.  She is following a Regular diet. She is not exercising. She does not smoke.  Use of agents associated with hypertension: none.   Symptoms: No chest pain No chest pressure  No palpitations No syncope  *Yes dyspnea No orthopnea  No paroxysmal nocturnal dyspnea No lower extremity edema  *evaluated by Cardiology - 11/05/20. Echo -12/19/20 showed normal results  Pertinent labs: Lab Results  Component Value Date   CHOL 173 10/31/2019   HDL 52 10/31/2019   LDLCALC 89 10/31/2019   TRIG 188 (H) 10/31/2019   CHOLHDL 3.3 10/31/2019   Lab Results  Component Value Date   NA 136 07/10/2020   K 3.6 07/10/2020   CREATININE 0.71 07/10/2020   EGFR 88 07/10/2020   GLUCOSE 124 (H) 07/10/2020   TSH 3.900 07/10/2020     The 10-year ASCVD risk score (Arnett DK, et al., 2019) is: 66.9%   ---------------------------------------------------------------------------------------------------  Diabetes Mellitus Type II, Follow-up  Lab Results  Component Value Date   HGBA1C 7.0 (A)  09/15/2020   HGBA1C 6.9 (A) 07/10/2020   HGBA1C 7.1 (H) 05/05/2020   Wt Readings from Last 3 Encounters:  06/16/21 187 lb 14.4 oz (85.2 kg)  11/05/20 188 lb 6 oz (85.4 kg)  09/15/20 186 lb (84.4 kg)   Last seen for diabetes 5 months ago.  Management since then includes Metformin. She reports fair compliance with treatment. She is not having side effects.  Symptoms: No fatigue No foot ulcerations  No appetite changes No nausea  No paresthesia of the feet  No polydipsia  No polyuria No visual disturbances   No vomiting     Home blood sugar records:  does not read  Episodes of hypoglycemia? No    Current insulin regiment: none Most Recent Eye Exam: in May of 2022 Current exercise: none Current Diet: trying to eat healthy  Pertinent Labs: Lab Results  Component Value Date   CHOL 173 10/31/2019   HDL 52 10/31/2019   LDLCALC 89 10/31/2019   TRIG 188 (H) 10/31/2019   CHOLHDL 3.3 10/31/2019   Lab Results  Component Value Date   NA 136 07/10/2020   K 3.6 07/10/2020   CREATININE 0.71 07/10/2020   EGFR 88 07/10/2020   MICROALBUR 50 06/16/2016     ---------------------------------------------------------------------------------------------------   Subclinical hypothyroidism  Last TSH from 11/05/20 was 2.81 Denies having lethargy or fatigue, cold intolerance or hearing impairment. Had an ED visit on /22 with c/o constipation. Endorses having dry skin.  Denies having paresthesias, hoarseness  Insomnia  Patient has been taking Ambien since 11/25/2014, medication was prescribed by a provider before me. She is taking it at bedtime as needed.  Past Surgical History:  Procedure Laterality Date   ABDOMINAL HYSTERECTOMY  2002   TAH/BSO   APPENDECTOMY  1959   BILATERAL SALPINGOOPHORECTOMY      Family History  Problem Relation Age of Onset   Stroke Mother    Prostate cancer Father    Cancer Other    Coronary artery disease Other    Stroke Other     Social History    Socioeconomic History   Marital status: Widowed    Spouse name: Not on file   Number of children: Not on file   Years of education: Not on file   Highest education level: Not on file  Occupational History   Not on file  Tobacco Use   Smoking status: Never   Smokeless tobacco: Never  Vaping Use   Vaping Use: Never used  Substance and Sexual Activity   Alcohol use: Yes    Alcohol/week: 3.0 standard drinks    Types: 3 Glasses of wine per week   Drug use: No   Sexual activity: Never    Birth control/protection: Surgical, None    Comment: BTL/HYST  Other Topics Concern   Not on file  Social History Narrative   Full time. Regularly exercises.    Social Determinants of Health   Financial Resource Strain: Not on file  Food Insecurity: Not on file  Transportation Needs: Not on file  Physical Activity: Not on file  Stress: Not on file  Social Connections: Not on file  Intimate Partner Violence: Not on file    Outpatient Medications Prior to Visit  Medication Sig Dispense Refill   fish oil-omega-3 fatty acids 1000 MG capsule Take 2 g by mouth daily.     latanoprost (XALATAN) 0.005 % ophthalmic solution Place 1 drop into both eyes at bedtime.  0   Multiple Vitamins-Minerals (CENTRUM SILVER PO) Take by mouth once.     atenolol-chlorthalidone (TENORETIC) 50-25 MG tablet TAKE 1 TABLET BY MOUTH EVERY DAY 90 tablet 0   metFORMIN (GLUCOPHAGE) 500 MG tablet Take 1 tablet (500 mg total) by mouth daily with breakfast. 90 tablet 3   zolpidem (AMBIEN) 10 MG tablet Take 1 tablet (10 mg total) by mouth at bedtime as needed for sleep. 30 tablet 5   glucose blood (ONETOUCH VERIO) test strip Use to check blood sugar once daily (Patient not taking: Reported on 11/05/2020) 100 each 11   HYDROcodone-homatropine (HYCODAN) 5-1.5 MG/5ML syrup Hydromet 5 mg-1.5 mg/5 mL oral syrup  TK 5 MLS PO Q 4 TO 6 H PRN F COUGH (Patient not taking: Reported on 11/05/2020)     ONETOUCH DELICA LANCETS FINE MISC To  check blood sugars once a day.  DX: E11.9 (Patient not taking: Reported on 11/05/2020) 100 each 1   rosuvastatin (CRESTOR) 5 MG tablet TAKE 1 TABLET(5 MG) BY MOUTH DAILY (Patient not taking: Reported on 06/16/2021) 90 tablet 3   No facility-administered medications prior to visit.    Allergies  Allergen Reactions   Latex     ROS Review of Systems  Constitutional: Negative.   HENT: Negative.    Eyes: Negative.   Respiratory: Negative.    Cardiovascular:  Leg swelling: leg swelling "when on a plane".  Gastrointestinal:  Negative for constipation (after an ED visit for constipation, she tries to watch her diet).  Endocrine:  Negative.      Objective:    Physical Exam Vitals reviewed.  Constitutional:      General: She is not in acute distress.    Appearance: She is obese. She is not ill-appearing, toxic-appearing or diaphoretic.  HENT:     Head: Normocephalic and atraumatic.     Nose: Nose normal. No congestion or rhinorrhea.     Mouth/Throat:     Mouth: Mucous membranes are dry.  Eyes:     Extraocular Movements: Extraocular movements intact.     Pupils: Pupils are equal, round, and reactive to light.  Cardiovascular:     Rate and Rhythm: Normal rate and regular rhythm.     Pulses: Normal pulses.     Heart sounds: Normal heart sounds.  Pulmonary:     Effort: Pulmonary effort is normal.     Breath sounds: Normal breath sounds.  Abdominal:     General: Abdomen is flat.     Palpations: Abdomen is soft.  Musculoskeletal:        General: No swelling.     Cervical back: Normal range of motion and neck supple.  Neurological:     Mental Status: She is alert.  Psychiatric:        Mood and Affect: Mood normal.        Thought Content: Thought content normal.        Judgment: Judgment normal.    BP (!) 143/69    Pulse 70    Resp 16    Wt 187 lb 14.4 oz (85.2 kg)    SpO2 93%    BMI 35.50 kg/m  Wt Readings from Last 3 Encounters:  06/16/21 187 lb 14.4 oz (85.2 kg)  11/05/20  188 lb 6 oz (85.4 kg)  09/15/20 186 lb (84.4 kg)     Health Maintenance Due  Topic Date Due   COVID-19 Vaccine (1) Never done   Zoster Vaccines- Shingrix (1 of 2) Never done   OPHTHALMOLOGY EXAM  04/26/2017   FOOT EXAM  10/30/2020   INFLUENZA VACCINE  11/24/2020   HEMOGLOBIN A1C  03/18/2021    There are no preventive care reminders to display for this patient.  Lab Results  Component Value Date   TSH 3.900 07/10/2020   Lab Results  Component Value Date   WBC 10.3 07/10/2020   HGB 14.4 07/10/2020   HCT 41.7 07/10/2020   MCV 95 07/10/2020   PLT 240 07/10/2020   Lab Results  Component Value Date   NA 136 07/10/2020   K 3.6 07/10/2020   CO2 25 07/10/2020   GLUCOSE 124 (H) 07/10/2020   BUN 16 07/10/2020   CREATININE 0.71 07/10/2020   BILITOT 0.4 07/10/2020   ALKPHOS 81 07/10/2020   AST 15 07/10/2020   ALT 13 07/10/2020   PROT 6.9 07/10/2020   ALBUMIN 4.2 07/10/2020   CALCIUM 9.9 07/10/2020   ANIONGAP 7 06/21/2013   EGFR 88 07/10/2020   Lab Results  Component Value Date   CHOL 173 10/31/2019   Lab Results  Component Value Date   HDL 52 10/31/2019   Lab Results  Component Value Date   LDLCALC 89 10/31/2019   Lab Results  Component Value Date   TRIG 188 (H) 10/31/2019   Lab Results  Component Value Date   CHOLHDL 3.3 10/31/2019   Lab Results  Component Value Date   HGBA1C 7.0 (A) 09/15/2020      Assessment & Plan:   Problem List Items Addressed This Visit  Endocrine   Subclinical hypothyroidism - Primary   Relevant Orders   TSH   Diabetes mellitus ([place])   Relevant Medications   atenolol-chlorthalidone (TENORETIC) 50-25 MG tablet   metFORMIN (GLUCOPHAGE) 500 MG tablet   Other Relevant Orders   Comprehensive metabolic panel     Other   Insomnia   Relevant Medications   zolpidem (AMBIEN) 10 MG tablet   Other Relevant Orders   Comprehensive metabolic panel   Other Visit Diagnoses     Essential hypertension       Relevant  Medications   atenolol-chlorthalidone (TENORETIC) 50-25 MG tablet   Other Relevant Orders   Lipid panel   Comprehensive metabolic panel      Problem List Items Addressed This Visit            Cardiovascular and Mediastinum    Hypertension associated with diabetes ([place]) - Primary      Slightly elevated Thinks this is white coat HTN - advised to monitor home BPs Continue current medications Reviewed recent metabolic panel F/u in 2 months with me.            Endocrine    Hyperlipidemia associated with type 2 diabetes mellitus ([place])      Previously well controlled Stopped taking crestor as she does not feeling well when she is taking it Ordered FLP and CMP  Goal LDL < 70        Subclinical hypothyroidism      Asymptomatic Reviewed last TSH Ordered TSH        Diabetes mellitus ([place])      Well controlled (higher A1c goal due to advanced age) Associated with HTN and HLD Continue current medications UTD on vaccines, eye exam-Dr. Gloriann Loan?, foot exam-will perform at next visit Discussed diet and exercise F/u in 2 months         Relevant Orders    POCT glycosylated hemoglobin (Hb A1C) (Completed)        Other    Insomnia      Uses medication when travels. She will be going to [place] in two weeks. Discussed with patient in length adverse events of Zolpidem in older adults(falls, fractures delirium, increase in ER visits, and motor vehicle crashes. Recommended to use Zolpidem on as needed basis  We will revisit her medication list at the next visit. Patient expressed her understanding and agreed with the plan.              Obesity      Discussed importance of healthy weight management Discussed diet and exercise                Other Visit Diagnoses     DOE (dyspnea on exertion)        Relevant Orders    Seen at Cardiology 11/05/2020 Echo from 12/19/20 showed "Normal LV function  No significant valve disease, normal pressures.No further recommendations or  medications changes"           Return in about 2 months (around 08/14/2021 ) for follow-up with me    Meds ordered this encounter  Medications   atenolol-chlorthalidone (TENORETIC) 50-25 MG tablet    Sig: Take 1 tablet by mouth daily.    Dispense:  90 tablet    Refill:  0    **Patient requests 90 days supply**    Order Specific Question:   Supervising Provider    Answer:   Birdie Sons [[id number]]   zolpidem (AMBIEN) 10 MG tablet    Sig:  Take 1 tablet (10 mg total) by mouth at bedtime as needed for sleep.    Dispense:  30 tablet    Refill:  2    Order Specific Question:   Supervising Provider    Answer:   Birdie Sons [[id number]]   metFORMIN (GLUCOPHAGE) 500 MG tablet    Sig: Take 1 tablet (500 mg total) by mouth daily with breakfast.    Dispense:  90 tablet    Refill:  3    Order Specific Question:   Supervising Provider    Answer:   Birdie Sons [[id number]]    I discussed the assessment and treatment plan with the patient. The patient was provided an opportunity to ask questions and all were answered. The patient agreed with the plan and demonstrated an understanding of the instructions.   The patient was advised to call back or seek an in-person evaluation if the symptoms worsen or if the condition fails to improve as anticipated.   I, [name], PA-C, have reviewed all documentation for this visit. The documentation on 06/16/21 for the exam, diagnosis, procedures, and orders are all accurate and complete.  [name], PA-C [facility] [phone] (phone) [phone] (fax)   [place], [place]

## 2021-06-17 DIAGNOSIS — I10 Essential (primary) hypertension: Secondary | ICD-10-CM | POA: Diagnosis not present

## 2021-06-17 DIAGNOSIS — F5101 Primary insomnia: Secondary | ICD-10-CM | POA: Diagnosis not present

## 2021-06-17 DIAGNOSIS — E038 Other specified hypothyroidism: Secondary | ICD-10-CM | POA: Diagnosis not present

## 2021-06-17 DIAGNOSIS — E119 Type 2 diabetes mellitus without complications: Secondary | ICD-10-CM | POA: Diagnosis not present

## 2021-06-18 LAB — COMPREHENSIVE METABOLIC PANEL
ALT: 18 IU/L (ref 0–32)
AST: 15 IU/L (ref 0–40)
Albumin/Globulin Ratio: 1.6 (ref 1.2–2.2)
Albumin: 4.1 g/dL (ref 3.7–4.7)
Alkaline Phosphatase: 61 IU/L (ref 44–121)
BUN/Creatinine Ratio: 24 (ref 12–28)
BUN: 18 mg/dL (ref 8–27)
Bilirubin Total: 0.4 mg/dL (ref 0.0–1.2)
CO2: 26 mmol/L (ref 20–29)
Calcium: 9.7 mg/dL (ref 8.7–10.3)
Chloride: 99 mmol/L (ref 96–106)
Creatinine, Ser: 0.75 mg/dL (ref 0.57–1.00)
Globulin, Total: 2.5 g/dL (ref 1.5–4.5)
Glucose: 155 mg/dL — ABNORMAL HIGH (ref 70–99)
Potassium: 4.1 mmol/L (ref 3.5–5.2)
Sodium: 141 mmol/L (ref 134–144)
Total Protein: 6.6 g/dL (ref 6.0–8.5)
eGFR: 81 mL/min/{1.73_m2} (ref >59)

## 2021-06-18 LAB — TSH: TSH: 4.36 u[IU]/mL (ref 0.450–4.500)

## 2021-06-18 LAB — LIPID PANEL
Chol/HDL Ratio: 5.6 ratio — ABNORMAL HIGH (ref 0.0–4.4)
Cholesterol, Total: 268 mg/dL — ABNORMAL HIGH (ref 100–199)
HDL: 48 mg/dL (ref >39)
LDL Chol Calc (NIH): 183 mg/dL — ABNORMAL HIGH (ref 0–99)
Triglycerides: 198 mg/dL — ABNORMAL HIGH (ref 0–149)
VLDL Cholesterol Cal: 37 mg/dL (ref 5–40)

## 2021-06-18 LAB — HEMOGLOBIN A1C
Est. average glucose Bld gHb Est-mCnc: 171 mg/dL
Hgb A1c MFr Bld: 7.6 % — ABNORMAL HIGH (ref 4.8–5.6)

## 2021-08-06 DIAGNOSIS — L814 Other melanin hyperpigmentation: Secondary | ICD-10-CM | POA: Diagnosis not present

## 2021-08-06 DIAGNOSIS — D229 Melanocytic nevi, unspecified: Secondary | ICD-10-CM | POA: Diagnosis not present

## 2021-08-06 DIAGNOSIS — L219 Seborrheic dermatitis, unspecified: Secondary | ICD-10-CM | POA: Diagnosis not present

## 2021-08-06 DIAGNOSIS — L821 Other seborrheic keratosis: Secondary | ICD-10-CM | POA: Diagnosis not present

## 2021-08-06 DIAGNOSIS — L719 Rosacea, unspecified: Secondary | ICD-10-CM | POA: Diagnosis not present

## 2021-08-06 DIAGNOSIS — D1801 Hemangioma of skin and subcutaneous tissue: Secondary | ICD-10-CM | POA: Diagnosis not present

## 2021-08-14 ENCOUNTER — Encounter: Payer: Self-pay | Admitting: Physician Assistant

## 2021-08-14 ENCOUNTER — Ambulatory Visit (INDEPENDENT_AMBULATORY_CARE_PROVIDER_SITE_OTHER): Payer: Medicare Other | Admitting: Physician Assistant

## 2021-08-14 ENCOUNTER — Telehealth: Payer: Self-pay | Admitting: Physician Assistant

## 2021-08-14 VITALS — BP 141/71 | HR 71 | Temp 97.9°F | Resp 16 | Wt 184.5 lb

## 2021-08-14 DIAGNOSIS — E785 Hyperlipidemia, unspecified: Secondary | ICD-10-CM | POA: Diagnosis not present

## 2021-08-14 DIAGNOSIS — E119 Type 2 diabetes mellitus without complications: Secondary | ICD-10-CM | POA: Diagnosis not present

## 2021-08-14 DIAGNOSIS — E1169 Type 2 diabetes mellitus with other specified complication: Secondary | ICD-10-CM

## 2021-08-14 DIAGNOSIS — M25471 Effusion, right ankle: Secondary | ICD-10-CM | POA: Diagnosis not present

## 2021-08-14 DIAGNOSIS — I10 Essential (primary) hypertension: Secondary | ICD-10-CM | POA: Diagnosis not present

## 2021-08-14 DIAGNOSIS — R0602 Shortness of breath: Secondary | ICD-10-CM | POA: Diagnosis not present

## 2021-08-14 MED ORDER — METFORMIN HCL 500 MG PO TABS: 500.0000 mg | ORAL_TABLET | Freq: Every day | ORAL | 3 refills | Status: DC

## 2021-08-14 MED ORDER — ATENOLOL-CHLORTHALIDONE 50-25 MG PO TABS: 1.0000 | ORAL_TABLET | Freq: Every day | ORAL | 0 refills | Status: DC

## 2021-08-14 MED ORDER — SEMAGLUTIDE(0.25 OR 0.5MG/DOS) 2 MG/1.5ML ~~LOC~~ SOPN: 0.2500 mg | PEN_INJECTOR | SUBCUTANEOUS | 0 refills | Status: DC

## 2021-08-14 MED ORDER — LISINOPRIL 5 MG PO TABS: 5.0000 mg | ORAL_TABLET | Freq: Every day | ORAL | 3 refills | Status: DC

## 2021-08-14 NOTE — Telephone Encounter (Signed)
Please, let know patient that she might need to come back on Monday  for her labs ?Thank you ?

## 2021-08-14 NOTE — Progress Notes (Signed)
?  ? ?I,Miasha Emmons Robinson,acting as a Education administrator for [employer], PA-C.,have documented all relevant documentation on the behalf of [name], PA-C,as directed by  [employer], PA-C while in the presence of [employer], PA-C. ? ? ?Established patient visit ? ? ?Patient: Nicole Lynn   DOB: 31-Aug-1940   82 y.o. Female  MRN: [id number] ?Visit Date: 08/14/2021 ? ?Today's healthcare provider: [name], PA-C  ? ?Chief Complaint  ?Patient presents with  ? Hypertension  ? Diabetes  ? Insomnia  ? ?Subjective  ?Patient is here for 2 month follow up DM, Hypertension and Insomnia  ?Patient reports she sometimes gets dizzy x last two months when raising from sitting, or bending down.  ?  ?Diabetes Mellitus Type II, Follow-up ? ?Lab Results  ?Component Value Date  ? HGBA1C 7.6 (H) 06/17/2021  ? HGBA1C 7.0 (A) 09/15/2020  ? HGBA1C 6.9 (A) 07/10/2020  ? ?Wt Readings from Last 3 Encounters:  ?08/14/21 184 lb 8 oz (83.7 kg)  ?06/16/21 187 lb 14.4 oz (85.2 kg)  ?11/05/20 188 lb 6 oz (85.4 kg)  ? ?Last seen for diabetes 2 months ago.  ?Management since then includes no changes/metFORMIN  ?She reports excellent compliance with treatment. ?She is not having side effects.  ?Symptoms: ?No fatigue No foot ulcerations  ?No appetite changes No nausea  ?No paresthesia of the feet  No polydipsia  ?No polyuria No visual disturbances   ?No vomiting   ? ? ?Home blood sugar records: does not read  ? ?Episodes of hypoglycemia? No  ?  ?Most Recent Eye Exam: May '22 ?Current exercise: none ?Trying to eat healthy  ? ?Pertinent Labs: ?Lab Results  ?Component Value Date  ? CHOL 268 (H) 06/17/2021  ? HDL 48 06/17/2021  ? LDLCALC 183 (H) 06/17/2021  ? TRIG 198 (H) 06/17/2021  ? CHOLHDL 5.6 (H) 06/17/2021  ? Lab Results  ?Component Value Date  ? NA 141 06/17/2021  ? K 4.1 06/17/2021  ? CREATININE 0.75 06/17/2021  ? EGFR 81 06/17/2021  ? MICROALBUR 50 06/16/2016  ?   ? ?---------------------------------------------------------------------------------------------------  ?Hypertension, follow-up ? ?BP Readings from Last 3 Encounters:  ?08/14/21 120/72  ?06/16/21 (!) 143/69  ?11/05/20 (!) 160/70  ? Wt Readings from Last 3 Encounters:  ?08/14/21 184 lb 8 oz (83.7 kg)  ?06/16/21 187 lb 14.4 oz (85.2 kg)  ?11/05/20 188 lb 6 oz (85.4 kg)  ?  ? ?She was last seen for hypertension 2 months ago.  ?BP at that visit was 143/69. Management since that visit includes atenolol-chlorthalidone  ? ?She reports excellent compliance with treatment. ?She is not having side effects. ?She is following a Regular diet. ?She is not exercising. ?She does not smoke. ? ?Use of agents associated with hypertension: none.  ? ?Symptoms: ?No chest pain No chest pressure  ?No palpitations No syncope  ?No dyspnea No orthopnea  ?No paroxysmal nocturnal dyspnea No lower extremity edema  ? ?Pertinent labs ?Lab Results  ?Component Value Date  ? CHOL 268 (H) 06/17/2021  ? HDL 48 06/17/2021  ? LDLCALC 183 (H) 06/17/2021  ? TRIG 198 (H) 06/17/2021  ? CHOLHDL 5.6 (H) 06/17/2021  ? Lab Results  ?Component Value Date  ? NA 141 06/17/2021  ? K 4.1 06/17/2021  ? CREATININE 0.75 06/17/2021  ? EGFR 81 06/17/2021  ? GLUCOSE 155 (H) 06/17/2021  ? TSH 4.360 06/17/2021  ?  ? ?The 10-year ASCVD risk score (Arnett DK, et al., 2019) is: 42.3% ? ? ?. ?Follow up  for insomnia  ? ?The patient was last seen for this 2 months ago. ?Changes made at last visit include Zolpidem on as needed basis .Patient takes only with travel.  ? ?She reports good compliance with treatment. ?She feels that condition is Improved. ?She is not having side effects.  ? ?-----------------------------------------------------------------------------------------  ? ?Medications: ?Outpatient Medications Prior to Visit  ?Medication Sig  ? atenolol-chlorthalidone (TENORETIC) 50-25 MG tablet Take 1 tablet by mouth daily.  ? fish oil-omega-3 fatty acids 1000 MG capsule  Take 2 g by mouth daily.  ? latanoprost (XALATAN) 0.005 % ophthalmic solution Place 1 drop into both eyes at bedtime.  ? metFORMIN (GLUCOPHAGE) 500 MG tablet Take 1 tablet (500 mg total) by mouth daily with breakfast.  ? Multiple Vitamins-Minerals (CENTRUM SILVER PO) Take by mouth once.  ? rosuvastatin (CRESTOR) 5 MG tablet TAKE 1 TABLET(5 MG) BY MOUTH DAILY  ? zolpidem (AMBIEN) 10 MG tablet Take 1 tablet (10 mg total) by mouth at bedtime as needed for sleep.  ? ?No facility-administered medications prior to visit.  ? ? ?Review of Systems  ?Constitutional:  Negative for unexpected weight change.  ?Respiratory:  Positive for shortness of breath.   ?Cardiovascular:  Positive for leg swelling (after salty or spicy meal). Negative for chest pain.  ?Gastrointestinal:  Negative for abdominal pain, blood in stool, constipation, diarrhea and nausea.  ? ? ?  Objective  ?  ?BP 120/72 (BP Location: Left Arm, Patient Position: Sitting, Cuff Size: Normal)   Pulse 71   Temp 97.9 ?F (36.6 ?C) (Oral)   Resp 16   Wt 184 lb 8 oz (83.7 kg)   SpO2 98%   BMI 34.86 kg/m?  ? ? ?Physical Exam ?Vitals and nursing note reviewed.  ?Constitutional:   ?   Appearance: Normal appearance. She is obese.  ?HENT:  ?   Head: Normocephalic and atraumatic.  ?Eyes:  ?   Pupils: Pupils are equal, round, and reactive to light.  ?Cardiovascular:  ?   Rate and Rhythm: Normal rate and regular rhythm.  ?   Pulses: Normal pulses.  ?Pulmonary:  ?   Effort: Pulmonary effort is normal.  ?   Breath sounds: Normal breath sounds.  ?Abdominal:  ?   General: Abdomen is flat. Bowel sounds are normal.  ?   Palpations: Abdomen is soft.  ?Musculoskeletal:  ?   Right lower leg: Edema (mild, ankle) present.  ?   Left lower leg: No edema.  ?Skin: ?   Findings: No erythema, lesion or rash.  ?Neurological:  ?   Mental Status: She is alert and oriented to person, place, and time.  ?Psychiatric:     ?   Behavior: Behavior normal.     ?   Thought Content: Thought content  normal.  ?  ? ?  08/14/2021  ?  2:48 PM 05/05/2020  ?  1:34 PM 10/16/2018  ? 10:09 AM  ?PHQ9 SCORE ONLY  ?PHQ-9 Total Score 4 0 0  ? ? ? ?No results found for any visits on 08/14/21. ? Assessment & Plan  ?  ? ?1. Shortness of breath ?Chronic ?-CBC ordered ?- Last CMP WNL, TSH WNL from 06/17/21 ?BP 143/73, Oxygen sat. 98% ?- EKG 12-Lead Sinus  Rhythm, ectopic ventricular beat   ?-Left atrial enlargement.  ?Last evaluated by Cardiology - 11/05/20. Echo -12/19/20 showed normal results ?- Might consider PHQ9 and GAD7 at next visit ?PHQ score today was 4 ? ?2. Right ankle swelling ?Mild, BP 141/70 ?Hx of  venous veins of leg with complications ?LE ven reflux on 01/29/14 showed no DVT , no deep reflux noted, except reflux in the left proximal greater saphenous veins only ?- B Nat Peptide ?Might need to add lasix ? ?3. Type 2 diabetes mellitus without complication, without long-term current use of insulin ([place]) ? ?- HgB A1c ?- Semaglutide,0.25 or 0.5MG/DOS, 2 MG/1.5ML SOPN; Inject 0.25 mg into the skin once a week.  Dispense: 1.5 mL; Refill: 0 ?Might consider and dispense if she will prefer weekly SQ injection ?- metFORMIN (GLUCOPHAGE) 500 MG tablet; Take 1 tablet (500 mg total) by mouth daily with breakfast.  Dispense: 90 tablet; Refill: 3 ? ?4. Primary hypertension ?Chronic, not at goal ?BP at office today -  S 141, at home - S higher than 150 ?- lisinopril (ZESTRIL) 5 MG tablet; Take 1 tablet (5 mg total) by mouth daily.  Dispense: 90 tablet; Refill: 3 ?- atenolol-chlorthalidone (TENORETIC) 50-25 MG tablet; Take 1 tablet by mouth daily.  Dispense: 90 tablet; Refill: 0 ? ?5. HLD associated with DMII ?Chronic. ?Recommended statin ?A trial of statin was started. Free sample was provided. ?Recommended to eat more vegetables, fruits and less sugar and fat. ?Recommended to lose weight, exercise, ?Increase soluble fibers and omega-3 FA, fistful of walnuts, decreased saturated fat. ? ?FU in 1 mo. ?Needs wellness visit and PE ?   ?The  patient was advised to call back or seek an in-person evaluation if the symptoms worsen or if the condition fails to improve as anticipated. ? ?I discussed the assessment and treatment plan with the patient. The patient was provided an opportunity to ask questions and all were answered. The

## 2021-08-16 ENCOUNTER — Encounter: Payer: Self-pay | Admitting: Physician Assistant

## 2021-08-18 NOTE — Telephone Encounter (Signed)
Pt informed about which labs and agreeable to come in tomorrow.  ?Also, advised if she would like [place] to show her how to do her injections she can make appt to come into the office and we can teach her.  States she refuses to do it herself.  States she will ask a friend for help or possibly come into office.  She will let us know.  ?

## 2021-08-19 ENCOUNTER — Ambulatory Visit: Payer: Medicare Other | Admitting: Physician Assistant

## 2021-08-19 DIAGNOSIS — E119 Type 2 diabetes mellitus without complications: Secondary | ICD-10-CM | POA: Diagnosis not present

## 2021-08-19 DIAGNOSIS — M25471 Effusion, right ankle: Secondary | ICD-10-CM | POA: Diagnosis not present

## 2021-08-20 LAB — CBC WITH DIFFERENTIAL/PLATELET
Basophils Absolute: 0.1 10*3/uL (ref 0.0–0.2)
Basos: 1 %
EOS (ABSOLUTE): 0.1 10*3/uL (ref 0.0–0.4)
Eos: 2 %
Hematocrit: 42.7 % (ref 34.0–46.6)
Hemoglobin: 14.5 g/dL (ref 11.1–15.9)
Immature Grans (Abs): 0 10*3/uL (ref 0.0–0.1)
Immature Granulocytes: 0 %
Lymphocytes Absolute: 1.5 10*3/uL (ref 0.7–3.1)
Lymphs: 29 %
MCH: 32.2 pg (ref 26.6–33.0)
MCHC: 34 g/dL (ref 31.5–35.7)
MCV: 95 fL (ref 79–97)
Monocytes Absolute: 0.4 10*3/uL (ref 0.1–0.9)
Monocytes: 8 %
Neutrophils Absolute: 3.2 10*3/uL (ref 1.4–7.0)
Neutrophils: 60 %
Platelets: 240 10*3/uL (ref 150–450)
RBC: 4.51 x10E6/uL (ref 3.77–5.28)
RDW: 12.9 % (ref 11.7–15.4)
WBC: 5.3 10*3/uL (ref 3.4–10.8)

## 2021-08-20 LAB — HEMOGLOBIN A1C
Est. average glucose Bld gHb Est-mCnc: 163 mg/dL
Hgb A1c MFr Bld: 7.3 % — ABNORMAL HIGH (ref 4.8–5.6)

## 2021-08-20 LAB — BRAIN NATRIURETIC PEPTIDE: BNP: 49.2 pg/mL (ref 0.0–100.0)

## 2021-08-21 ENCOUNTER — Ambulatory Visit (INDEPENDENT_AMBULATORY_CARE_PROVIDER_SITE_OTHER): Payer: Medicare Other | Admitting: Physician Assistant

## 2021-08-21 DIAGNOSIS — Z7189 Other specified counseling: Secondary | ICD-10-CM | POA: Diagnosis not present

## 2021-08-21 NOTE — Progress Notes (Signed)
?  ? ? ?  I,Nicole Lynn,acting as a Neurosurgeon for [employer], PA-C.,have documented all relevant documentation on the behalf of Nicole Lat, PA-C,as directed by  [employer], PA-C while in the presence of [employer], PA-C. ? ?Established patient visit ? ? ?Patient: Nicole Lynn   DOB: 11/24/1940   81 y.o. Female  MRN: [id number] ?Visit Date: 08/21/2021 ? ?Today's healthcare provider: [name], PA-C  ? ?CC: encounter for injection education and administration ? ?Subjective  ?  ? ?HPI   ?Patient education  ?Last edited by Nicole Lynn, CMA on 08/21/2021  1:18 PM.  ?  ?  ?Patient presents for education of administration of Semaglutide.   ? ?Medications: ?Outpatient Medications Prior to Visit  ?Medication Sig  ? atenolol-chlorthalidone (TENORETIC) 50-25 MG tablet Take 1 tablet by mouth daily.  ? fish oil-omega-3 fatty acids 1000 MG capsule Take 2 g by mouth daily.  ? latanoprost (XALATAN) 0.005 % ophthalmic solution Place 1 drop into both eyes at bedtime.  ? lisinopril (ZESTRIL) 5 MG tablet Take 1 tablet (5 mg total) by mouth daily.  ? metFORMIN (GLUCOPHAGE) 500 MG tablet Take 1 tablet (500 mg total) by mouth daily with breakfast.  ? Multiple Vitamins-Minerals (CENTRUM SILVER PO) Take by mouth once.  ? rosuvastatin (CRESTOR) 5 MG tablet TAKE 1 TABLET(5 MG) BY MOUTH DAILY  ? Semaglutide,0.25 or 0.5MG /DOS, 2 MG/1.5ML SOPN Inject 0.25 mg into the skin once a week.  ? zolpidem (AMBIEN) 10 MG tablet Take 1 tablet (10 mg total) by mouth at bedtime as needed for sleep.  ? ?No facility-administered medications prior to visit.  ? ? ?Review of Systems  ?All other systems reviewed and are negative. ?See HPI ? ?  Objective  ?  ?There were no vitals taken for this visit. ? ? ? No PE was taken for this visit ? ? ?No results found for any visits on 08/21/21. ? Assessment & Plan  ?  ? ?1. Encounter for injection education ?- Education and administration of semaglutide were performed by Nicole Lynn. ?   ? ?The  patient was advised to call back or seek an in-person evaluation if the symptoms worsen or if the condition fails to improve as anticipated. ? ? ?Nicole Lat, PA-C  ?[facility] ?[phone] (phone) ?[phone] (fax) ? ?[facility] ?

## 2021-09-10 DIAGNOSIS — H40153 Residual stage of open-angle glaucoma, bilateral: Secondary | ICD-10-CM | POA: Diagnosis not present

## 2021-09-10 NOTE — Progress Notes (Unsigned)
I,Joseline E Rosas,acting as a Education administrator for [employer], PA-C.,have documented all relevant documentation on the behalf of [name], PA-C,as directed by  [employer], PA-C while in the presence of [employer], PA-C.   Established patient visit   Patient: Nicole Lynn   DOB: March 11, 1941   81 y.o. Female  MRN: [id number] Visit Date: 09/14/2021  Today's healthcare provider: [name], PA-C   No chief complaint on file. CC: fu chronic medical conditions  Subjective    HPI  Hypertension, follow-up  BP Readings from Last 3 Encounters:  09/14/21 109/62  08/14/21 (!) 141/71  06/16/21 (!) 143/69   Wt Readings from Last 3 Encounters:  09/14/21 181 lb 1.6 oz (82.1 kg)  08/14/21 184 lb 8 oz (83.7 kg)  06/16/21 187 lb 14.4 oz (85.2 kg)     She was last seen for hypertension 2 months ago.  BP at that visit was 143/69. Management since that visit includes Continue current medications.  She reports excellent compliance with treatment. She is not having side effects.  She is following a Regular diet. She is not exercising. She does not smoke.  Outside blood pressures are 120's-140's/70's. Some in the 160's/90's Symptoms: No chest pain No chest pressure  No palpitations No syncope  No dyspnea No orthopnea  No paroxysmal nocturnal dyspnea No lower extremity edema   Pertinent labs Lab Results  Component Value Date   CHOL 268 (H) 06/17/2021   HDL 48 06/17/2021   LDLCALC 183 (H) 06/17/2021   TRIG 198 (H) 06/17/2021   CHOLHDL 5.6 (H) 06/17/2021   Lab Results  Component Value Date   NA 141 06/17/2021   K 4.1 06/17/2021   CREATININE 0.75 06/17/2021   EGFR 81 06/17/2021   GLUCOSE 155 (H) 06/17/2021   TSH 4.360 06/17/2021     The 10-year ASCVD risk score (Arnett DK, et al., 2019) is: 36.4%  ---------------------------------------------------------------------------------------------------   Medications: Outpatient Medications Prior to Visit   Medication Sig   atenolol-chlorthalidone (TENORETIC) 50-25 MG tablet Take 1 tablet by mouth daily.   fish oil-omega-3 fatty acids 1000 MG capsule Take 2 g by mouth daily.   latanoprost (XALATAN) 0.005 % ophthalmic solution Place 1 drop into both eyes at bedtime.   lisinopril (ZESTRIL) 5 MG tablet Take 1 tablet (5 mg total) by mouth daily.   metFORMIN (GLUCOPHAGE) 500 MG tablet Take 1 tablet (500 mg total) by mouth daily with breakfast.   Multiple Vitamins-Minerals (CENTRUM SILVER PO) Take by mouth once.   rosuvastatin (CRESTOR) 5 MG tablet TAKE 1 TABLET(5 MG) BY MOUTH DAILY   Semaglutide,0.25 or 0.5MG/DOS, 2 MG/1.5ML SOPN Inject 0.25 mg into the skin once a week.   zolpidem (AMBIEN) 10 MG tablet Take 1 tablet (10 mg total) by mouth at bedtime as needed for sleep.   No facility-administered medications prior to visit.    Review of Systems  {Labs  Heme  Chem  Endocrine  Serology  Results Review (optional):23779}   Objective    BP 109/62 (BP Location: Right Arm, Patient Position: Sitting, Cuff Size: Large)   Pulse 74   Temp 97.6 F (36.4 C) (Oral)   Resp 16   Ht '5\' 1"'  (1.549 m)   Wt 181 lb 1.6 oz (82.1 kg)   BMI 34.22 kg/m  {Show previous vital signs (optional):23777}  Physical Exam  ***  No results found for any visits on 09/14/21.  Assessment & Plan     ***  No follow-ups on file.  1. Shortness of breath Chronic -CBC WNL from 08/19/21 - Last CMP WNL, TSH WNL from 06/17/21 BP 143/73, Oxygen sat. 98% - EKG 12-Lead Sinus  Rhythm, ectopic ventricular beat   -Left atrial enlargement.  Last evaluated by Cardiology - 11/05/20. Echo -12/19/20 showed normal results - Might consider PHQ9 and GAD7 at next visit PHQ score today was 4   2. Right ankle swelling Mild, BP 141/70 Hx of venous veins of leg with complications LE ven reflux on 01/29/14 showed no DVT , no deep reflux noted, except reflux in the left proximal greater saphenous veins only - B Nat Peptide  49 Lasix/increase dose of    3. Type 2 diabetes mellitus without complication, without long-term current use of insulin (HCC)   - HgB A1c 7.3 vs 7.6  - Semaglutide,0.25 or 0.5MG/DOS, 2 MG/1.5ML SOPN; Inject 0.25 mg into the skin once a week.  Dispense: 1.5 mL; Refill: 0 Might consider and dispense if she will prefer weekly SQ injection or  - metFORMIN (GLUCOPHAGE) 500 MG tablet; Take 1 tablet (500 mg total) by mouth daily with breakfast.  Dispense: 90 tablet; Refill: 3 Eye exam recommended Foot exam performed today 4. Primary hypertension Chronic, not at goal BP at office today -  S 141, at home - S higher than 150 - lisinopril (ZESTRIL) 5 MG tablet; Take 1 tablet (5 mg total) by mouth daily.  Dispense: 90 tablet; Refill: 3 - atenolol-chlorthalidone (TENORETIC) 50-25 MG tablet; Take 1 tablet by mouth daily.  Dispense: 90 tablet; Refill: 0   5. HLD associated with DMII Chronic. Recommended statin A trial of statin was started. Free sample was provided. Recommended to eat more vegetables, fruits and less sugar and fat. Recommended to lose weight, exercise, Increase soluble fibers and omega-3 FA, fistful of walnuts, decreased saturated fat.  6. GAD7 PHQ9   FU in 1 mo. Needs wellness visit and PE     The patient was advised to call back or seek an in-person evaluation if the symptoms worsen or if the condition fails to improve as anticipated.   I discussed the assessment and treatment plan with the patient. The patient was provided an opportunity to ask questions and all were answered. The patient agreed with the plan and demonstrated an understanding of the instructions.   The entirety of the information documented in the History of Present Illness, Review of Systems and Physical Exam were personally obtained by me. Portions of this information were initially documented by the CMA and reviewed by me for thoroughness and accuracy.     Portions of this note were created using  dictation software and may contain typographical errors.   {provider attestation***:1}   [name], Hershal Coria  [facility] [phone] (phone) [phone] (fax)  [place]

## 2021-09-14 ENCOUNTER — Encounter: Payer: Self-pay | Admitting: Physician Assistant

## 2021-09-14 ENCOUNTER — Ambulatory Visit (INDEPENDENT_AMBULATORY_CARE_PROVIDER_SITE_OTHER): Payer: Medicare Other | Admitting: Physician Assistant

## 2021-09-14 VITALS — BP 109/62 | HR 74 | Temp 97.6°F | Resp 16 | Ht 61.0 in | Wt 181.1 lb

## 2021-09-14 DIAGNOSIS — I152 Hypertension secondary to endocrine disorders: Secondary | ICD-10-CM

## 2021-09-14 DIAGNOSIS — E1169 Type 2 diabetes mellitus with other specified complication: Secondary | ICD-10-CM

## 2021-09-14 DIAGNOSIS — E1159 Type 2 diabetes mellitus with other circulatory complications: Secondary | ICD-10-CM

## 2021-09-14 DIAGNOSIS — E119 Type 2 diabetes mellitus without complications: Secondary | ICD-10-CM

## 2021-09-14 DIAGNOSIS — R0602 Shortness of breath: Secondary | ICD-10-CM

## 2021-09-14 DIAGNOSIS — E785 Hyperlipidemia, unspecified: Secondary | ICD-10-CM

## 2021-09-14 DIAGNOSIS — M25473 Effusion, unspecified ankle: Secondary | ICD-10-CM

## 2021-09-14 DIAGNOSIS — I83899 Varicose veins of unspecified lower extremities with other complications: Secondary | ICD-10-CM

## 2021-09-14 DIAGNOSIS — E669 Obesity, unspecified: Secondary | ICD-10-CM

## 2021-09-14 MED ORDER — SEMAGLUTIDE(0.25 OR 0.5MG/DOS) 2 MG/1.5ML ~~LOC~~ SOPN: 0.2500 mg | PEN_INJECTOR | SUBCUTANEOUS | 0 refills | Status: DC

## 2021-10-15 ENCOUNTER — Ambulatory Visit: Payer: Medicare Other | Admitting: Physician Assistant

## 2021-10-29 ENCOUNTER — Encounter: Payer: Self-pay | Admitting: Physician Assistant

## 2021-10-29 ENCOUNTER — Ambulatory Visit (INDEPENDENT_AMBULATORY_CARE_PROVIDER_SITE_OTHER): Payer: Medicare Other | Admitting: Physician Assistant

## 2021-10-29 VITALS — BP 120/90 | HR 72 | Temp 97.8°F | Resp 16 | Wt 181.8 lb

## 2021-10-29 DIAGNOSIS — I152 Hypertension secondary to endocrine disorders: Secondary | ICD-10-CM

## 2021-10-29 DIAGNOSIS — E119 Type 2 diabetes mellitus without complications: Secondary | ICD-10-CM | POA: Diagnosis not present

## 2021-10-29 DIAGNOSIS — R0602 Shortness of breath: Secondary | ICD-10-CM | POA: Diagnosis not present

## 2021-10-29 DIAGNOSIS — M25473 Effusion, unspecified ankle: Secondary | ICD-10-CM

## 2021-10-29 DIAGNOSIS — E1159 Type 2 diabetes mellitus with other circulatory complications: Secondary | ICD-10-CM

## 2021-10-29 LAB — POCT GLYCOSYLATED HEMOGLOBIN (HGB A1C)
Est. average glucose Bld gHb Est-mCnc: 151
Hemoglobin A1C: 6.9 % — AB (ref 4.0–5.6)

## 2021-10-29 NOTE — Progress Notes (Signed)
I,Nicole Lynn,acting as a Education administrator for [employer], PA-C.,have documented all relevant documentation on the behalf of Nicole Speak, PA-C,as directed by  [employer], PA-C while in the presence of [employer], PA-C.  Established patient visit   Patient: Nicole Lynn   DOB: Jun 05, 1940   81 y.o. Female  MRN: [id number] Visit Date: 10/29/2021  Today's healthcare provider: [name], PA-C   Chief Complaint  Patient presents with   Diabetes   Subjective    Diabetes Mellitus Type II, Follow-up  Lab Results  Component Value Date   HGBA1C 7.3 (H) 08/19/2021   HGBA1C 7.6 (H) 06/17/2021   HGBA1C 7.0 (A) 09/15/2020   Wt Readings from Last 3 Encounters:  10/29/21 181 lb 12.8 oz (82.5 kg)  09/14/21 181 lb 1.6 oz (82.1 kg)  08/14/21 184 lb 8 oz (83.7 kg)   Last seen for diabetes 6 weeks ago.  Management since then includes - Continue Metformin - Semaglutide,0.25 or 0.5MG/DOS, 2 MG/1.5ML SOPN; Inject 0.25 mg into the skin once a week. She reports poor compliance with treatment. Not using for 1 month.  She has been having under a lot of stress at home  Symptoms: No fatigue No foot ulcerations  No appetite changes No nausea  No paresthesia of the feet  No polydipsia  No polyuria No visual disturbances   No vomiting     Home blood sugar records: fasting range: average 125  Episodes of hypoglycemia? No    Current regiment: Semaglutide,0.25 or 0.5MG/DOS, 2 MG/1.5ML SOPN; Inject 0.25 mg into the skin once a week.  Most Recent Eye Exam: appt in August '23 Current exercise: yard work Current diet habits: in general, a "healthy" diet    Pertinent Labs: Lab Results  Component Value Date   CHOL 268 (H) 06/17/2021   HDL 48 06/17/2021   LDLCALC 183 (H) 06/17/2021   TRIG 198 (H) 06/17/2021   CHOLHDL 5.6 (H) 06/17/2021   Lab Results  Component Value Date   NA 141 06/17/2021   K 4.1 06/17/2021   CREATININE 0.75 06/17/2021   EGFR 81 06/17/2021   MICROALBUR  50 06/16/2016     --------------------------------------------------------------------------------------------------- Follow up for ankle swelling   The patient was last seen for this 6 weeks ago. Changes made at last visit include continue current diuretic.  She reports excellent compliance with treatment. She feels that condition is Improved. She is not having side effects.   HTN SBP at home between 143 to 114 Adheres to healthy diet. Compliant with her medications. Denies having any side effects.  Ankle swelling at PM Reports having chronic SOB -----------------------------------------------------------------------------------------   Medications: Outpatient Medications Prior to Visit  Medication Sig Note   atenolol-chlorthalidone (TENORETIC) 50-25 MG tablet Take 1 tablet by mouth daily.    fish oil-omega-3 fatty acids 1000 MG capsule Take 2 g by mouth daily.    latanoprost (XALATAN) 0.005 % ophthalmic solution Place 1 drop into both eyes at bedtime.    lisinopril (ZESTRIL) 5 MG tablet Take 1 tablet (5 mg total) by mouth daily.    metFORMIN (GLUCOPHAGE) 500 MG tablet Take 1 tablet (500 mg total) by mouth daily with breakfast.    Multiple Vitamins-Minerals (CENTRUM SILVER PO) Take by mouth once.    zolpidem (AMBIEN) 10 MG tablet Take 1 tablet (10 mg total) by mouth at bedtime as needed for sleep.    Semaglutide,0.25 or 0.5MG/DOS, 2 MG/1.5ML SOPN Inject 0.25 mg into the skin once a week. (Patient not  taking: Reported on 10/29/2021) 10/29/2021: Not taking x 1 mo    No facility-administered medications prior to visit.    Review of Systems  All other systems reviewed and are negative.  See HPI    Objective    BP 120/90 (BP Location: Left Arm, Patient Position: Sitting, Cuff Size: Normal)   Pulse 72   Temp 97.8 F (36.6 C) (Oral)   Resp 16   Wt 181 lb 12.8 oz (82.5 kg)   SpO2 98%   BMI 34.35 kg/m      No results found for any visits on 10/29/21.  Assessment & Plan      1. Type 2 diabetes mellitus without complication, without long-term current use of insulin (HCC)  - Urine microalbumin-creatinine with uACR - POCT glycosylated hemoglobin (Hb A1C)  2. Shortness of breath Chronic. Stable. Managed by Cardiology  3. Hypertension associated with diabetes ([place]) BP today was 120/90  4. Ankle swelling, unspecified laterality Chronic. Stable. Managed by Cardiology  CPE in August + chronic conditions  The patient was advised to call back or seek an in-person evaluation if the symptoms worsen or if the condition fails to improve as anticipated.  I discussed the assessment and treatment plan with the patient. The patient was provided an opportunity to ask questions and all were answered. The patient agreed with the plan and demonstrated an understanding of the instructions.  The entirety of the information documented in the History of Present Illness, Review of Systems and Physical Exam were personally obtained by me. Portions of this information were initially documented by the CMA and reviewed by me for thoroughness and accuracy.   Portions of this note were created using dictation software and may contain typographical errors.     Nicole Speak, PA-C  [facility] [phone] (phone) [phone] (fax)  [place]

## 2021-10-30 LAB — MICROALBUMIN / CREATININE URINE RATIO
Creatinine, Urine: 89.7 mg/dL
Microalb/Creat Ratio: 4 mg/g creat (ref 0–29)
Microalbumin, Urine: 3.9 ug/mL

## 2021-10-30 LAB — SPECIMEN STATUS REPORT

## 2021-10-30 NOTE — Progress Notes (Signed)
Hello Bonna Gains ,   Your labwork results all are within normal limits.  No changes need to be made to medications, and no further tests need to be ordered.  Any questions please reach out to the office or message me on MyChart!  Best,  [name], PA-C

## 2021-10-30 NOTE — Progress Notes (Incomplete)
I,Bree Heinzelman Robinson,acting as a Education administrator for [employer], PA-C.,have documented all relevant documentation on the behalf of [name], PA-C,as directed by  [employer], PA-C while in the presence of [employer], PA-C.  Established patient visit   Patient: Nicole Lynn   DOB: 1940/08/07   81 y.o. Female  MRN: [id number] Visit Date: 10/29/2021  Today's healthcare provider: [name], PA-C   Chief Complaint  Patient presents with  . Diabetes   Subjective    Diabetes Mellitus Type II, Follow-up  Lab Results  Component Value Date   HGBA1C 7.3 (H) 08/19/2021   HGBA1C 7.6 (H) 06/17/2021   HGBA1C 7.0 (A) 09/15/2020   Wt Readings from Last 3 Encounters:  10/29/21 181 lb 12.8 oz (82.5 kg)  09/14/21 181 lb 1.6 oz (82.1 kg)  08/14/21 184 lb 8 oz (83.7 kg)   Last seen for diabetes 6 weeks ago.  Management since then includes - Continue Metformin - Semaglutide,0.25 or 0.5MG/DOS, 2 MG/1.5ML SOPN; Inject 0.25 mg into the skin once a week. She reports poor compliance with treatment. Not using for 1 month.  She has been having under a lot of stress at home  Symptoms: No fatigue No foot ulcerations  No appetite changes No nausea  No paresthesia of the feet  No polydipsia  No polyuria No visual disturbances   No vomiting     Home blood sugar records: fasting range: average 125  Episodes of hypoglycemia? No    Current regiment: Semaglutide,0.25 or 0.5MG/DOS, 2 MG/1.5ML SOPN; Inject 0.25 mg into the skin once a week.  Most Recent Eye Exam: appt in August '23 Current exercise: yard work Current diet habits: in general, a "healthy" diet    Pertinent Labs: Lab Results  Component Value Date   CHOL 268 (H) 06/17/2021   HDL 48 06/17/2021   LDLCALC 183 (H) 06/17/2021   TRIG 198 (H) 06/17/2021   CHOLHDL 5.6 (H) 06/17/2021   Lab Results  Component Value Date   NA 141 06/17/2021   K 4.1 06/17/2021   CREATININE 0.75 06/17/2021   EGFR 81 06/17/2021   MICROALBUR  50 06/16/2016     --------------------------------------------------------------------------------------------------- Follow up for ankle swelling   The patient was last seen for this 6 weeks ago. Changes made at last visit include continue current diuretic.  She reports excellent compliance with treatment. She feels that condition is Improved. She is not having side effects.   -----------------------------------------------------------------------------------------   Medications: Outpatient Medications Prior to Visit  Medication Sig Note  . atenolol-chlorthalidone (TENORETIC) 50-25 MG tablet Take 1 tablet by mouth daily.   . fish oil-omega-3 fatty acids 1000 MG capsule Take 2 g by mouth daily.   Marland Kitchen latanoprost (XALATAN) 0.005 % ophthalmic solution Place 1 drop into both eyes at bedtime.   Marland Kitchen lisinopril (ZESTRIL) 5 MG tablet Take 1 tablet (5 mg total) by mouth daily.   . metFORMIN (GLUCOPHAGE) 500 MG tablet Take 1 tablet (500 mg total) by mouth daily with breakfast.   . Multiple Vitamins-Minerals (CENTRUM SILVER PO) Take by mouth once.   Marland Kitchen zolpidem (AMBIEN) 10 MG tablet Take 1 tablet (10 mg total) by mouth at bedtime as needed for sleep.   . Semaglutide,0.25 or 0.5MG/DOS, 2 MG/1.5ML SOPN Inject 0.25 mg into the skin once a week. (Patient not taking: Reported on 10/29/2021) 10/29/2021: Not taking x 1 mo    No facility-administered medications prior to visit.    Review of Systems  All other systems reviewed  and are negative.  See HPI {Labs  Heme  Chem  Endocrine  Serology  Results Review (optional):23779}   Objective    BP 120/90 (BP Location: Left Arm, Patient Position: Sitting, Cuff Size: Normal)   Pulse 72   Temp 97.8 F (36.6 C) (Oral)   Resp 16   Wt 181 lb 12.8 oz (82.5 kg)   SpO2 98%   BMI 34.35 kg/m  {Show previous vital signs (optional):23777}  Physical Exam  ***  No results found for any visits on 10/29/21.  Assessment & Plan     1. Type 2 diabetes  mellitus without complication, without long-term current use of insulin (HCC)  - Urine microalbumin-creatinine with uACR - POCT glycosylated hemoglobin (Hb A1C)  2. Shortness of breath Chronic. Stable. Managed by Cardiology  3. Hypertension associated with diabetes ([place]) BP today was 120/90  4. Ankle swelling, unspecified laterality Chronic. Stab  The patient was advised to call back or seek an in-person evaluation if the symptoms worsen or if the condition fails to improve as anticipated.  I discussed the assessment and treatment plan with the patient. The patient was provided an opportunity to ask questions and all were answered. The patient agreed with the plan and demonstrated an understanding of the instructions.  The entirety of the information documented in the History of Present Illness, Review of Systems and Physical Exam were personally obtained by me. Portions of this information were initially documented by the CMA and reviewed by me for thoroughness and accuracy.  Portions of this note were created using dictation software and may contain typographical errors.   [name], PA-C  [facility] [phone] (phone) [phone] (fax)  [place]

## 2021-11-12 ENCOUNTER — Other Ambulatory Visit: Payer: Self-pay | Admitting: Physician Assistant

## 2021-11-12 DIAGNOSIS — E119 Type 2 diabetes mellitus without complications: Secondary | ICD-10-CM

## 2021-11-27 ENCOUNTER — Encounter: Payer: Medicare Other | Admitting: Physician Assistant

## 2021-12-17 ENCOUNTER — Other Ambulatory Visit: Payer: Self-pay | Admitting: Obstetrics and Gynecology

## 2021-12-17 DIAGNOSIS — Z1231 Encounter for screening mammogram for malignant neoplasm of breast: Secondary | ICD-10-CM | POA: Diagnosis not present

## 2021-12-17 DIAGNOSIS — Z1211 Encounter for screening for malignant neoplasm of colon: Secondary | ICD-10-CM | POA: Diagnosis not present

## 2021-12-17 DIAGNOSIS — M8580 Other specified disorders of bone density and structure, unspecified site: Secondary | ICD-10-CM | POA: Diagnosis not present

## 2021-12-17 DIAGNOSIS — Z01419 Encounter for gynecological examination (general) (routine) without abnormal findings: Secondary | ICD-10-CM | POA: Diagnosis not present

## 2021-12-22 ENCOUNTER — Encounter: Payer: Self-pay | Admitting: Physician Assistant

## 2021-12-22 ENCOUNTER — Ambulatory Visit (INDEPENDENT_AMBULATORY_CARE_PROVIDER_SITE_OTHER): Payer: Medicare Other | Admitting: Physician Assistant

## 2021-12-22 VITALS — BP 122/74 | HR 76 | Ht 61.0 in | Wt 174.0 lb

## 2021-12-22 DIAGNOSIS — Z0000 Encounter for general adult medical examination without abnormal findings: Secondary | ICD-10-CM

## 2021-12-22 DIAGNOSIS — E785 Hyperlipidemia, unspecified: Secondary | ICD-10-CM

## 2021-12-22 DIAGNOSIS — E1159 Type 2 diabetes mellitus with other circulatory complications: Secondary | ICD-10-CM | POA: Diagnosis not present

## 2021-12-22 DIAGNOSIS — I10 Essential (primary) hypertension: Secondary | ICD-10-CM

## 2021-12-22 DIAGNOSIS — E1169 Type 2 diabetes mellitus with other specified complication: Secondary | ICD-10-CM

## 2021-12-22 DIAGNOSIS — E119 Type 2 diabetes mellitus without complications: Secondary | ICD-10-CM

## 2021-12-22 DIAGNOSIS — I152 Hypertension secondary to endocrine disorders: Secondary | ICD-10-CM

## 2021-12-22 DIAGNOSIS — E669 Obesity, unspecified: Secondary | ICD-10-CM

## 2021-12-22 DIAGNOSIS — Z780 Asymptomatic menopausal state: Secondary | ICD-10-CM | POA: Insufficient documentation

## 2021-12-22 MED ORDER — OZEMPIC (0.25 OR 0.5 MG/DOSE) 2 MG/3ML ~~LOC~~ SOPN: 0.2500 mg | PEN_INJECTOR | SUBCUTANEOUS | 0 refills | Status: DC

## 2021-12-22 NOTE — Progress Notes (Signed)
I,Sha'taria Tyson,acting as a Education administrator for [employer], PA-C.,have documented all relevant documentation on the behalf of Nicole Speak, PA-C,as directed by  [employer], PA-C while in the presence of [employer], PA-C.   Annual Wellness Visit     Patient: Nicole Lynn, Female    DOB: 06-02-1940, 81 y.o.   MRN: [id number] Visit Date: 12/22/2021  Today's Provider: [name], PA-C   No chief complaint on file.  Subjective    Nicole Lynn is a 81 y.o. female who presents today for her Annual Wellness Visit. She reports consuming a general diet.  The patient reports that she does not have a gym routine as a form of exercise but does yard work daily.  She generally feels well. She reports sleeping well. She does not have additional problems to discuss today.   HPI - Eye Exam in September, last one 4 months ago. Release of Information completed to be faxed to [facility]   Medications: Outpatient Medications Prior to Visit  Medication Sig  . atenolol-chlorthalidone (TENORETIC) 50-25 MG tablet Take 1 tablet by mouth daily.  . fish oil-omega-3 fatty acids 1000 MG capsule Take 2 g by mouth daily.  Marland Kitchen latanoprost (XALATAN) 0.005 % ophthalmic solution Place 1 drop into both eyes at bedtime.  Marland Kitchen lisinopril (ZESTRIL) 5 MG tablet Take 1 tablet (5 mg total) by mouth daily.  . metFORMIN (GLUCOPHAGE) 500 MG tablet Take 1 tablet (500 mg total) by mouth daily with breakfast.  . Multiple Vitamins-Minerals (CENTRUM SILVER PO) Take by mouth once.  Marland Kitchen OZEMPIC, 0.25 OR 0.5 MG/DOSE, 2 MG/3ML SOPN INJECT 0.25MG  INTO THE SKIN ONCE A WEEK  . zolpidem (AMBIEN) 10 MG tablet Take 1 tablet (10 mg total) by mouth at bedtime as needed for sleep.   No facility-administered medications prior to visit.    Allergies  Allergen Reactions  . Latex     Patient Care Team: Elberta Leatherwood as PCP - General (Physician Assistant) Delsa Bern, MD as Consulting Physician (Obstetrics  and Gynecology) Loa Socks, MD as Referring Physician (Dermatology)  Review of Systems  Constitutional: Negative.   HENT: Negative.    Eyes: Negative.   Respiratory: Negative.    Cardiovascular: Negative.   Gastrointestinal: Negative.   Endocrine: Negative.   Genitourinary: Negative.   Musculoskeletal: Negative.   Skin: Negative.   Allergic/Immunologic: Negative.   Neurological: Negative.   Hematological: Negative.   Psychiatric/Behavioral: Negative.      {Labs  Heme  Chem  Endocrine  Serology  Results Review (optional):23779}    Objective    Vitals: There were no vitals taken for this visit. BP Readings from Last 3 Encounters:  10/29/21 120/90  09/14/21 109/62  08/14/21 (!) 141/71   Wt Readings from Last 3 Encounters:  10/29/21 181 lb 12.8 oz (82.5 kg)  09/14/21 181 lb 1.6 oz (82.1 kg)  08/14/21 184 lb 8 oz (83.7 kg)       Physical Exam Constitutional:      Appearance: Normal appearance. She is normal weight.  HENT:     Head: Normocephalic and atraumatic.     Right Ear: Tympanic membrane, ear canal and external ear normal.     Left Ear: Tympanic membrane, ear canal and external ear normal.     Nose: Nose normal.     Mouth/Throat:     Mouth: Mucous membranes are moist.     Pharynx: Oropharynx is clear.  Eyes:     Extraocular Movements: Extraocular movements intact.  Conjunctiva/sclera: Conjunctivae normal.     Pupils: Pupils are equal, round, and reactive to light.  Cardiovascular:     Rate and Rhythm: Normal rate and regular rhythm.     Pulses: Normal pulses.     Heart sounds: Normal heart sounds.  Pulmonary:     Effort: Pulmonary effort is normal.     Breath sounds: Normal breath sounds.  Abdominal:     General: Abdomen is flat. Bowel sounds are normal.     Palpations: Abdomen is soft.  Musculoskeletal:        General: Normal range of motion.     Cervical back: Normal range of motion and neck supple.  Skin:    General: Skin is warm and  dry.  Neurological:     General: No focal deficit present.     Mental Status: She is alert and oriented to person, place, and time. Mental status is at baseline.  Psychiatric:        Mood and Affect: Mood normal.        Behavior: Behavior normal.        Thought Content: Thought content normal.        Judgment: Judgment normal.     Most recent functional status assessment:    08/14/2021    2:49 PM  In your present state of health, do you have any difficulty performing the following activities:  Hearing? 0  Vision? 0  Difficulty concentrating or making decisions? 0  Walking or climbing stairs? 1  Dressing or bathing? 0  Doing errands, shopping? 0   Most recent fall risk assessment:    08/14/2021    2:48 PM  Fall Risk   Falls in the past year? 0  Number falls in past yr: 0  Injury with Fall? 0  Follow up Falls evaluation completed    Most recent depression screenings:    08/14/2021    2:48 PM 06/16/2021    2:49 PM  PHQ 2/9 Scores  PHQ - 2 Score 0   PHQ- 9 Score 4   Exception Documentation  Patient refusal   Most recent cognitive screening:    10/16/2018   10:15 AM  6CIT Screen  What Year? 0 points  What month? 0 points  What time? 0 points  Count back from 20 2 points  Months in reverse 0 points  Repeat phrase 8 points  Total Score 10 points   Most recent Audit-C alcohol use screening    08/14/2021    2:49 PM  Alcohol Use Disorder Test (AUDIT)  1. How often do you have a drink containing alcohol? 0  2. How many drinks containing alcohol do you have on a typical day when you are drinking? 0  3. How often do you have six or more drinks on one occasion? 0  AUDIT-C Score 0   A score of 3 or more in women, and 4 or more in men indicates increased risk for alcohol abuse, EXCEPT if all of the points are from question 1   No results found for any visits on 12/22/21.  Assessment & Plan     Annual wellness visit done today including the all of the  following: Reviewed patient's Family Medical History Reviewed and updated list of patient's medical providers Assessment of cognitive impairment was done Assessed patient's functional ability Established a written schedule for health screening services Health Risk Assessent Completed and Reviewed  Exercise Activities and Dietary recommendations  Goals     . Weight (lb) <  180 lb (81.6 kg)     Starting in 2018, I will work to lose 10 lbs this year.          Immunization History  Administered Date(s) Administered  . Fluad Quad(high Dose 65+) 03/06/2019, 04/11/2020  . Influenza Split 06/13/2012  . Influenza, High Dose Seasonal PF 02/18/2014, 01/05/2016, 02/08/2018  . Influenza,inj,Quad PF,6+ Mos 01/08/2013, 05/16/2015  . Pneumococcal Conjugate-13 05/16/2015  . Pneumococcal Polysaccharide-23 01/08/2013  . Zoster, Live 10/25/2012    Health Maintenance  Topic Date Due  . COVID-19 Vaccine (1) Never done  . Zoster Vaccines- Shingrix (1 of 2) Never done  . OPHTHALMOLOGY EXAM  04/26/2017  . INFLUENZA VACCINE  11/24/2021  . TETANUS/TDAP  10/30/2024 (Originally 10/04/1961)  . HEMOGLOBIN A1C  05/01/2022  . FOOT EXAM  09/15/2022  . Pneumonia Vaccine 81+ Years old  Completed  . DEXA SCAN  Completed  . HPV VACCINES  Aged Out  . COLONOSCOPY (Pts 45-65yrs Insurance coverage will need to be confirmed)  Discontinued     Discussed health benefits of physical activity, and encouraged her to engage in regular exercise appropriate for her age and condition.    ***  No follow-ups on file.     {provider attestation***:1}   [name], Cordelia Poche  [facility] [phone] (phone) [phone] (fax)  [facility]

## 2022-01-01 ENCOUNTER — Other Ambulatory Visit: Payer: Self-pay | Admitting: Physician Assistant

## 2022-01-01 DIAGNOSIS — I10 Essential (primary) hypertension: Secondary | ICD-10-CM

## 2022-01-04 NOTE — Telephone Encounter (Signed)
Requested Prescriptions  Pending Prescriptions Disp Refills  . atenolol-chlorthalidone (TENORETIC) 50-25 MG tablet [Pharmacy Med Name: ATENOLOL/CHLORTHALIDONE 50/25 TABS] 90 tablet 0    Sig: TAKE 1 TABLET BY MOUTH DAILY     Cardiovascular: Beta Blocker + Diuretic Combos Failed - 01/01/2022  3:28 AM      Failed - K in normal range and within 180 days    Potassium  Date Value Ref Range Status  06/17/2021 4.1 3.5 - 5.2 mmol/L Final  06/21/2013 4.2 3.5 - 5.1 mmol/L Final         Failed - Na in normal range and within 180 days    Sodium  Date Value Ref Range Status  06/17/2021 141 134 - 144 mmol/L Final  06/21/2013 138 136 - 145 mmol/L Final         Failed - Cr in normal range and within 180 days    Creatinine  Date Value Ref Range Status  06/21/2013 0.54 (L) 0.60 - 1.30 mg/dL Final   Creatinine, Ser  Date Value Ref Range Status  06/17/2021 0.75 0.57 - 1.00 mg/dL Final         Failed - eGFR in normal range and within 180 days    EGFR (African American)  Date Value Ref Range Status  06/21/2013 >60  Final   GFR calc Af Amer  Date Value Ref Range Status  05/05/2020 99 >59 mL/min/1.73 Final    Comment:    **In accordance with recommendations from the NKF-ASN Task force,**   [facility] is in the process of updating its eGFR calculation to the   2021 CKD-EPI creatinine equation that estimates kidney function   without a race variable.    EGFR (Non-African Amer.)  Date Value Ref Range Status  06/21/2013 >60  Final    Comment:    eGFR values <77m/min/1.73 m2 may be an indication of chronic kidney disease (CKD). Calculated eGFR is useful in patients with stable renal function. The eGFR calculation will not be reliable in acutely ill patients when serum creatinine is changing rapidly. It is not useful in  patients on dialysis. The eGFR calculation may not be applicable to patients at the low and high extremes of body sizes, pregnant women, and vegetarians. potassium - Slight  hemolysis, interpret results with  - caution.    GFR calc non Af Amer  Date Value Ref Range Status  05/05/2020 86 >59 mL/min/1.73 Final   eGFR  Date Value Ref Range Status  06/17/2021 81 >59 mL/min/1.73 Final         Passed - Last BP in normal range    BP Readings from Last 1 Encounters:  12/22/21 122/74         Passed - Last Heart Rate in normal range    Pulse Readings from Last 1 Encounters:  12/22/21 76         Passed - Valid encounter within last 6 months    Recent Outpatient Visits          2 months ago Type 2 diabetes mellitus without complication, without long-term current use of insulin ([place]   [facility] [place] [facility]   3 months ago Shortness of breath   [facility] [place] PA-C   4 months ago ESales executivefor injection education   [facility] JAshley PA-C   4 months ago Shortness of breath   [facility] J[place] PA-C   6 months ago Essential hypertension   [facility]  [name], PA-C      Future Appointments            In 4 weeks Ostwalt, Letitia Libra, PA-C [employer], [place]   In 2 months Gollan, Kathlene November, MD [place]. [place]

## 2022-01-13 DIAGNOSIS — M65341 Trigger finger, right ring finger: Secondary | ICD-10-CM | POA: Diagnosis not present

## 2022-01-13 DIAGNOSIS — M65342 Trigger finger, left ring finger: Secondary | ICD-10-CM | POA: Diagnosis not present

## 2022-01-19 DIAGNOSIS — H40153 Residual stage of open-angle glaucoma, bilateral: Secondary | ICD-10-CM | POA: Diagnosis not present

## 2022-01-19 DIAGNOSIS — H524 Presbyopia: Secondary | ICD-10-CM | POA: Diagnosis not present

## 2022-02-01 ENCOUNTER — Ambulatory Visit: Payer: Medicare Other | Admitting: Physician Assistant

## 2022-02-11 NOTE — Progress Notes (Signed)
I,Tiffany J Bragg,acting as a Education administrator for [employer], PA-C.,have documented all relevant documentation on the behalf of [name], PA-C,as directed by  [employer], PA-C while in the presence of [employer], PA-C.   Established patient visit   Patient: Nicole Lynn   DOB: Jul 01, 1940   81 y.o. Female  MRN: [id number] Visit Date: 02/15/2022  Today's healthcare provider: [name], PA-C   Chief Complaint  Patient presents with   Diabetes   Hypertension   Hyperlipidemia   Subjective    HPI  Diabetes Mellitus Type II, follow-up  Lab Results  Component Value Date   HGBA1C 6.9 (A) 10/29/2021   HGBA1C 7.3 (H) 08/19/2021   HGBA1C 7.6 (H) 06/17/2021   Last seen for diabetes 3 months ago.  Management since then includes continuing the same treatment. She reports excellent compliance with treatment. She is not having side effects.   Home blood sugar records:  not checked regularly  Episodes of hypoglycemia? No    Current insulin regiment: NA   Most Recent Eye Exam: September  --------------------------------------------------------------------------------------------------- Hypertension, follow-up  BP Readings from Last 3 Encounters:  02/15/22 (!) 112/57  12/22/21 122/74  10/29/21 120/90   Wt Readings from Last 3 Encounters:  02/15/22 174 lb (78.9 kg)  12/22/21 174 lb (78.9 kg)  10/29/21 181 lb 12.8 oz (82.5 kg)     She was last seen for hypertension 3 months ago.  BP at that visit was 122/74. Management since that visit includes continue medication. She reports excellent compliance with treatment. She is not having side effects.  She is exercising. She is adherent to low salt diet.   Outside blood pressures are not checked regularly.  She does not smoke.  Use of agents associated with hypertension: none.   --------------------------------------------------------------------------------------------------- Lipid/Cholesterol,  follow-up  Last Lipid Panel: Lab Results  Component Value Date   CHOL 268 (H) 06/17/2021   LDLCALC 183 (H) 06/17/2021   HDL 48 06/17/2021   TRIG 198 (H) 06/17/2021    She was last seen for this 3 months ago.  Management since that visit includes lifestyle modifications. Pt prefers not to use any type of "cholesterol medications." He lost around 7 pounds and feels quite well.  She reports excellent compliance with treatment. She is not having side effects.   Symptoms: No appetite changes No foot ulcerations  No chest pain No chest pressure/discomfort  Yes dyspnea/ chronic Yes orthopnea/chronic  No fatigue No lower extremity edema  No palpitations No paroxysmal nocturnal dyspnea  No nausea No numbness or tingling of extremity  No polydipsia No polyuria  No speech difficulty No syncope   She is following a Regular diet. Current exercise: gardening  Last metabolic panel Lab Results  Component Value Date   GLUCOSE 155 (H) 06/17/2021   NA 141 06/17/2021   K 4.1 06/17/2021   BUN 18 06/17/2021   CREATININE 0.75 06/17/2021   EGFR 81 06/17/2021   GFRNONAA 86 05/05/2020   CALCIUM 9.7 06/17/2021   AST 15 06/17/2021   ALT 18 06/17/2021   The 10-year ASCVD risk score (Arnett DK, et al., 06/14/17) is: 41% Recent death in her family/elder brother of 46 yo, grieving. ---------------------------------------------------------------------------------------------------   Medications: Outpatient Medications Prior to Visit  Medication Sig   atenolol-chlorthalidone (TENORETIC) 50-25 MG tablet TAKE 1 TABLET BY MOUTH DAILY   fish oil-omega-3 fatty acids 1000 MG capsule Take 2 g by mouth daily.   latanoprost (XALATAN) 0.005 % ophthalmic solution Place 1 drop  into both eyes at bedtime.   lisinopril (ZESTRIL) 5 MG tablet Take 1 tablet (5 mg total) by mouth daily.   metFORMIN (GLUCOPHAGE) 500 MG tablet Take 1 tablet (500 mg total) by mouth daily with breakfast.   Multiple Vitamins-Minerals  (CENTRUM SILVER PO) Take by mouth once.   Semaglutide,0.25 or 0.5MG/DOS, (OZEMPIC, 0.25 OR 0.5 MG/DOSE,) 2 MG/3ML SOPN Inject 0.25 mg into the skin once a week.   zolpidem (AMBIEN) 10 MG tablet Take 1 tablet (10 mg total) by mouth at bedtime as needed for sleep.   No facility-administered medications prior to visit.    Review of Systems  All other systems reviewed and are negative. Except see HPI     Objective    BP (!) 112/57 (BP Location: Right Arm, Patient Position: Sitting, Cuff Size: Normal)   Pulse 70   Resp 16   Wt 174 lb (78.9 kg)   SpO2 99%   BMI 32.88 kg/m    Physical Exam Vitals reviewed.  Constitutional:      General: She is not in acute distress.    Appearance: Normal appearance. She is well-developed. She is not diaphoretic.  HENT:     Head: Normocephalic and atraumatic.  Eyes:     General: No scleral icterus.    Extraocular Movements: Extraocular movements intact.     Conjunctiva/sclera: Conjunctivae normal.     Pupils: Pupils are equal, round, and reactive to light.  Neck:     Thyroid: No thyromegaly.  Cardiovascular:     Rate and Rhythm: Normal rate and regular rhythm.     Pulses: Normal pulses.     Heart sounds: Normal heart sounds. No murmur heard. Pulmonary:     Effort: Pulmonary effort is normal. No respiratory distress.     Breath sounds: Normal breath sounds. No wheezing, rhonchi or rales.  Abdominal:     General: Abdomen is flat. Bowel sounds are normal.     Palpations: Abdomen is soft.  Musculoskeletal:     Cervical back: Neck supple.     Right lower leg: No edema.     Left lower leg: No edema.  Lymphadenopathy:     Cervical: No cervical adenopathy.  Skin:    General: Skin is warm and dry.     Findings: No rash.  Neurological:     Mental Status: She is alert and oriented to person, place, and time. Mental status is at baseline.  Psychiatric:        Mood and Affect: Mood normal.        Behavior: Behavior normal.        Thought  Content: Thought content normal.        Judgment: Judgment normal.       No results found for any visits on 02/15/22.  Assessment & Plan     1. Type 2 diabetes mellitus without complication, without long-term current use of insulin (HCC) Chronic and stable? - Hemoglobin A1c Continue current medication and low-carb diet  2. Obesity, Class I, BMI 30-34.9 Lost 7 pounds for the past 3 mo - Comprehensive metabolic panel  Weight loss of 5% of pt's current weight via healthy diet and daily exercise encouraged.  3. Hyperlipidemia associated with type 2 diabetes mellitus ([employer])  - Lipid panel  4. Hypertension associated with diabetes ([place]) Pt was advised to start BP log and schedule an appt if her BP will stay low. - Lipid panel - Comprehensive metabolic panel - CBC with Differential/Platelet  - lisinopril (ZESTRIL) 5  MG tablet; Take 1 tablet (5 mg total) by mouth daily.  Dispense: 90 tablet; Refill: 3 Advised to proceed with low-salt diet  5. Varicose veins of leg with complications Leg swelling chronic No  swelling on the exam  6. Trigger ring finger of left hand Was seen Ortho at the [facility] . Pt had a steroid shots and doing better Will fu with them   7. Subclinical hypothyroidism  - Comprehensive metabolic panel - TSH  8. Grief Recent death in her family/elder brother/93/ Had to travel to [place]  Fu in 3-4 mo for chronic conditions. Will be seeing her ObGyn Dr. Katharine Look Revard/redefind for her.  The patient was advised to call back or seek an in-person evaluation if the symptoms worsen or if the condition fails to improve as anticipated.  I discussed the assessment and treatment plan with the patient. The patient was provided an opportunity to ask questions and all were answered. The patient agreed with the plan and demonstrated an understanding of the instructions.  The entirety of the information documented in the History of Present Illness, Review of Systems  and Physical Exam were personally obtained by me. Portions of this information were initially documented by the CMA and reviewed by me for thoroughness and accuracy.  Portions of this note were created using dictation software and may contain typographical errors.    [name], PA-C  [facility] [phone] (phone) [phone] (fax)  [place]

## 2022-02-15 ENCOUNTER — Ambulatory Visit (INDEPENDENT_AMBULATORY_CARE_PROVIDER_SITE_OTHER): Payer: Medicare Other | Admitting: Physician Assistant

## 2022-02-15 ENCOUNTER — Encounter: Payer: Self-pay | Admitting: Physician Assistant

## 2022-02-15 VITALS — BP 112/57 | HR 70 | Resp 16 | Wt 174.0 lb

## 2022-02-15 DIAGNOSIS — E785 Hyperlipidemia, unspecified: Secondary | ICD-10-CM | POA: Diagnosis not present

## 2022-02-15 DIAGNOSIS — I83899 Varicose veins of unspecified lower extremities with other complications: Secondary | ICD-10-CM

## 2022-02-15 DIAGNOSIS — E119 Type 2 diabetes mellitus without complications: Secondary | ICD-10-CM | POA: Diagnosis not present

## 2022-02-15 DIAGNOSIS — M65342 Trigger finger, left ring finger: Secondary | ICD-10-CM

## 2022-02-15 DIAGNOSIS — I152 Hypertension secondary to endocrine disorders: Secondary | ICD-10-CM

## 2022-02-15 DIAGNOSIS — E038 Other specified hypothyroidism: Secondary | ICD-10-CM

## 2022-02-15 DIAGNOSIS — E1159 Type 2 diabetes mellitus with other circulatory complications: Secondary | ICD-10-CM

## 2022-02-15 DIAGNOSIS — E1169 Type 2 diabetes mellitus with other specified complication: Secondary | ICD-10-CM

## 2022-02-15 DIAGNOSIS — E669 Obesity, unspecified: Secondary | ICD-10-CM

## 2022-02-15 DIAGNOSIS — I10 Essential (primary) hypertension: Secondary | ICD-10-CM

## 2022-02-15 MED ORDER — LISINOPRIL 5 MG PO TABS: 5.0000 mg | ORAL_TABLET | Freq: Every day | ORAL | 3 refills | Status: DC

## 2022-02-16 LAB — CBC WITH DIFFERENTIAL/PLATELET
Basophils Absolute: 0.1 10*3/uL (ref 0.0–0.2)
Basos: 1 %
EOS (ABSOLUTE): 0.1 10*3/uL (ref 0.0–0.4)
Eos: 2 %
Hematocrit: 42.1 % (ref 34.0–46.6)
Hemoglobin: 14.4 g/dL (ref 11.1–15.9)
Immature Grans (Abs): 0.1 10*3/uL (ref 0.0–0.1)
Immature Granulocytes: 1 %
Lymphocytes Absolute: 1.4 10*3/uL (ref 0.7–3.1)
Lymphs: 20 %
MCH: 32.6 pg (ref 26.6–33.0)
MCHC: 34.2 g/dL (ref 31.5–35.7)
MCV: 95 fL (ref 79–97)
Monocytes Absolute: 0.6 10*3/uL (ref 0.1–0.9)
Monocytes: 8 %
Neutrophils Absolute: 4.8 10*3/uL (ref 1.4–7.0)
Neutrophils: 68 %
Platelets: 267 10*3/uL (ref 150–450)
RBC: 4.42 x10E6/uL (ref 3.77–5.28)
RDW: 12.4 % (ref 11.7–15.4)
WBC: 7.1 10*3/uL (ref 3.4–10.8)

## 2022-02-16 LAB — LIPID PANEL
Chol/HDL Ratio: 4.5 ratio — ABNORMAL HIGH (ref 0.0–4.4)
Cholesterol, Total: 236 mg/dL — ABNORMAL HIGH (ref 100–199)
HDL: 53 mg/dL (ref >39)
LDL Chol Calc (NIH): 125 mg/dL — ABNORMAL HIGH (ref 0–99)
Triglycerides: 331 mg/dL — ABNORMAL HIGH (ref 0–149)
VLDL Cholesterol Cal: 58 mg/dL — ABNORMAL HIGH (ref 5–40)

## 2022-02-16 LAB — COMPREHENSIVE METABOLIC PANEL
ALT: 15 IU/L (ref 0–32)
AST: 16 IU/L (ref 0–40)
Albumin/Globulin Ratio: 1.8 (ref 1.2–2.2)
Albumin: 4.2 g/dL (ref 3.8–4.8)
Alkaline Phosphatase: 66 IU/L (ref 44–121)
BUN/Creatinine Ratio: 24 (ref 12–28)
BUN: 22 mg/dL (ref 8–27)
Bilirubin Total: <0.2 mg/dL (ref 0.0–1.2)
CO2: 24 mmol/L (ref 20–29)
Calcium: 9.9 mg/dL (ref 8.7–10.3)
Chloride: 98 mmol/L (ref 96–106)
Creatinine, Ser: 0.9 mg/dL (ref 0.57–1.00)
Globulin, Total: 2.3 g/dL (ref 1.5–4.5)
Glucose: 129 mg/dL — ABNORMAL HIGH (ref 70–99)
Potassium: 4.1 mmol/L (ref 3.5–5.2)
Sodium: 141 mmol/L (ref 134–144)
Total Protein: 6.5 g/dL (ref 6.0–8.5)
eGFR: 65 mL/min/{1.73_m2} (ref >59)

## 2022-02-16 LAB — HEMOGLOBIN A1C
Est. average glucose Bld gHb Est-mCnc: 154 mg/dL
Hgb A1c MFr Bld: 7 % — ABNORMAL HIGH (ref 4.8–5.6)

## 2022-02-16 LAB — TSH: TSH: 2.52 u[IU]/mL (ref 0.450–4.500)

## 2022-02-16 NOTE — Progress Notes (Signed)
[place] ,   Your labwork results all are within normal limits Except elevated cholesterol but it is less elevated than 8 mo ago. It seems that weight loss helps . Encouraged healthy low-carb, low-cholesterol diet and daily exercise routine. Advised to include oatmeal and omega-3 1,000 mg to your daily regimen. And elevated A1C . It seems stable across the a year period.  We might need to Increase the dose of metformin to 1,000 mg daily if wiill be acceptable  Any questions please reach out to the office or message me on MyChart!  Best, [name], PA-C

## 2022-02-23 ENCOUNTER — Other Ambulatory Visit: Payer: Self-pay | Admitting: Physician Assistant

## 2022-02-23 DIAGNOSIS — E119 Type 2 diabetes mellitus without complications: Secondary | ICD-10-CM

## 2022-02-23 DIAGNOSIS — E669 Obesity, unspecified: Secondary | ICD-10-CM

## 2022-02-23 DIAGNOSIS — I152 Hypertension secondary to endocrine disorders: Secondary | ICD-10-CM

## 2022-02-23 MED ORDER — METFORMIN HCL 1000 MG PO TABS: 1000.0000 mg | ORAL_TABLET | Freq: Every day | ORAL | 3 refills | Status: DC

## 2022-02-23 NOTE — Progress Notes (Signed)
Please, let pt know that she needs to start taking B6 and B12 every day. Taking metformin might decreased B6 and B12 with associated symptoms of neuropathy

## 2022-02-24 DIAGNOSIS — M65341 Trigger finger, right ring finger: Secondary | ICD-10-CM | POA: Diagnosis not present

## 2022-02-24 DIAGNOSIS — M65342 Trigger finger, left ring finger: Secondary | ICD-10-CM | POA: Diagnosis not present

## 2022-03-07 IMAGING — DX DG ABDOMEN 1V
2 series · 2 of 2 positions shown · non-contrast
Comparison: No prior.

CLINICAL DATA: Constipation.

EXAM:
ABDOMEN - 1 VIEW

[abdomen supine (1 of 2)]
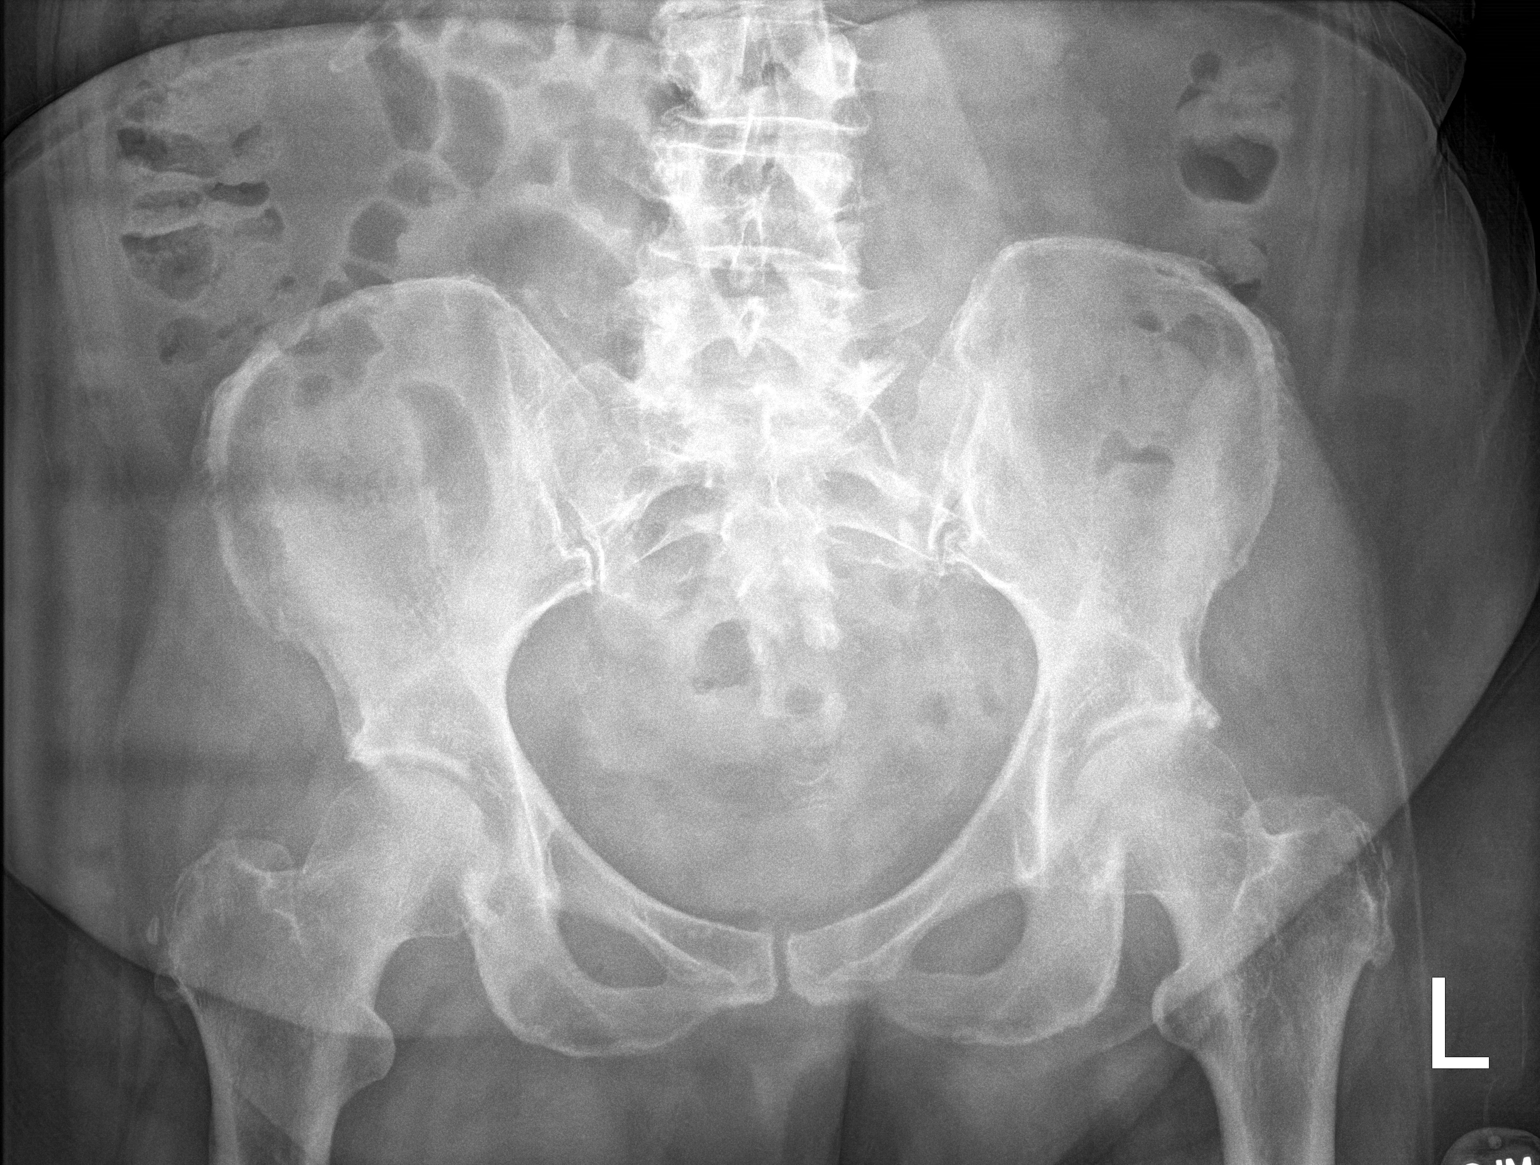

[abdomen supine (2 of 2)]
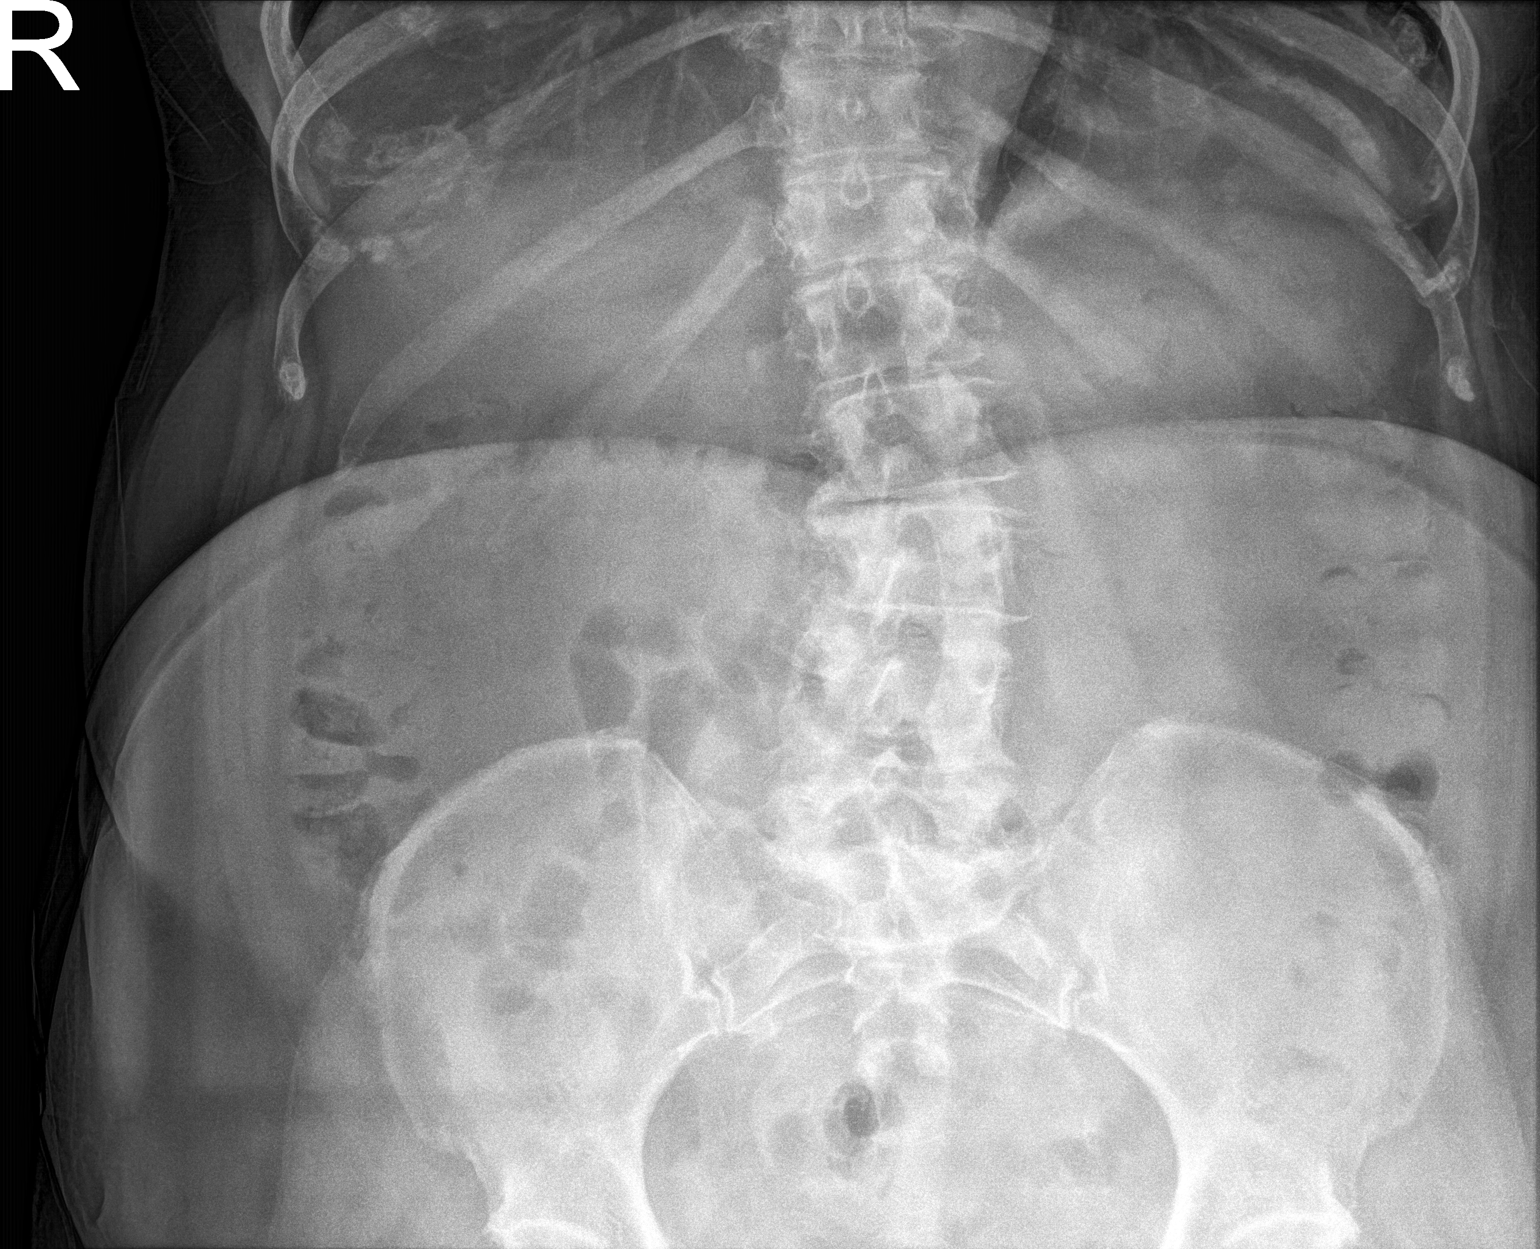

[2 of 2 positions shown; findings below may reference images not displayed]

FINDINGS: Prominent stool volume noted throughout the colon. No bowel
distention or free air. Thoracolumbar spine scoliosis and
degenerative change. Degenerative changes both hips. No acute bony
abnormality.
IMPRESSION: Prominent stool volume noted throughout the colon. No bowel
distention or free air.

## 2022-03-20 NOTE — Progress Notes (Signed)
Cardiology Office Note  Date:  03/23/2022   ID:  Onyx, Edgley 04-Dec-1940, MRN [id number]  PCP:  [name], PA-C   Chief Complaint  Patient presents with   Follow-up    Patient c/o shortness of breath with walking a short distance since she had Covid 2022. Medications reviewed by the patient verbally.     HPI:  Ms Nicole Lynn is a 81 year old woman with past medical history of Diabetes Hypertension Hyperlipidemia Presenting for routine follow-up of her SOB  Last seen in clinic by myself July 2022 Echocardiogram August 2022, normal study  In follow-up today she reports that she continues to have shortness of breath, debility on exertion Unable to walk like she used to do Reports previously able to walk 1 to 2 miles without having to stop, long hours in the garden, not able to perform these activities since COVID  Weight down 10 pounds since last clinic visit July 2022  On prior office visit reported that she was off crestor, does not want to be on cholesterol medication  Does not check blood pressure at home, numbers well controlled on today's visit  Lab work reviewed A1C 7.0 Total cholesterol 236 LDL 125  EKG personally reviewed by myself on todays visit Shows normal sinus rhythm rate 66 beats per minute no significant ST-T wave changes  PMH:   has a past medical history of Anxiety, Chest pain, unspecified, HTN (hypertension), Menopausal state, and Overweight(278.02).  PSH:    Past Surgical History:  Procedure Laterality Date   ABDOMINAL HYSTERECTOMY  2002   TAH/BSO   APPENDECTOMY  1959   BILATERAL SALPINGOOPHORECTOMY      Current Outpatient Medications  Medication Sig Dispense Refill   atenolol-chlorthalidone (TENORETIC) 50-25 MG tablet TAKE 1 TABLET BY MOUTH DAILY 90 tablet 0   fish oil-omega-3 fatty acids 1000 MG capsule Take 2 g by mouth daily.     latanoprost (XALATAN) 0.005 % ophthalmic solution Place 1 drop into both eyes at  bedtime.  0   lisinopril (ZESTRIL) 5 MG tablet Take 1 tablet (5 mg total) by mouth daily. 90 tablet 3   metFORMIN (GLUCOPHAGE) 500 MG tablet Take 1 tablet (500 mg total) by mouth daily with breakfast. 90 tablet 3   Multiple Vitamins-Minerals (CENTRUM SILVER PO) Take by mouth once.     metFORMIN (GLUCOPHAGE) 1000 MG tablet Take 1 tablet (1,000 mg total) by mouth daily with breakfast. (Patient not taking: Reported on 03/23/2022) 90 tablet 3   Semaglutide,0.25 or 0.5MG /DOS, (OZEMPIC, 0.25 OR 0.5 MG/DOSE,) 2 MG/3ML SOPN Inject 0.25 mg into the skin once a week. (Patient not taking: Reported on 03/23/2022) 3 mL 0   zolpidem (AMBIEN) 10 MG tablet Take 1 tablet (10 mg total) by mouth at bedtime as needed for sleep. (Patient not taking: Reported on 03/23/2022) 30 tablet 2   No current facility-administered medications for this visit.     Allergies:   Latex   Social History:  The patient  reports that she has never smoked. She has never used smokeless tobacco. She reports current alcohol use of about 3.0 standard drinks of alcohol per week. She reports that she does not use drugs.   Family History:   family history includes Cancer in an other family member; Coronary artery disease in an other family member; Prostate cancer in her father; Stroke in her mother and another family member.    Review of Systems: Review of Systems  Constitutional: Negative.   HENT: Negative.  Respiratory:  Positive for shortness of breath.   Cardiovascular: Negative.   Gastrointestinal: Negative.   Musculoskeletal: Negative.   Neurological: Negative.   Psychiatric/Behavioral: Negative.    All other systems reviewed and are negative.   PHYSICAL EXAM: VS:  BP 120/60 (BP Location: Left Arm, Patient Position: Sitting, Cuff Size: Normal)   Pulse 66   Ht 5\' 1"  (1.549 m)   Wt 178 lb (80.7 kg)   SpO2 98%   BMI 33.63 kg/m  , BMI Body mass index is 33.63 kg/m. Constitutional:  oriented to person, place, and time. No  distress.  HENT:  Head: Grossly normal Eyes:  no discharge. No scleral icterus.  Neck: No JVD, no carotid bruits  Cardiovascular: Regular rate and rhythm, no murmurs appreciated Pulmonary/Chest: Clear to auscultation bilaterally, no wheezes or rails Abdominal: Soft.  no distension.  no tenderness.  Musculoskeletal: Normal range of motion Neurological:  normal muscle tone. Coordination normal. No atrophy Skin: Skin warm and dry Psychiatric: normal affect, pleasant  Recent Labs: 08/19/2021: BNP 49.2 02/15/2022: ALT 15; BUN 22; Creatinine, Ser 0.90; Hemoglobin 14.4; Platelets 267; Potassium 4.1; Sodium 141; TSH 2.520    Lipid Panel Lab Results  Component Value Date   CHOL 236 (H) 02/15/2022   HDL 53 02/15/2022   LDLCALC 125 (H) 02/15/2022   TRIG 331 (H) 02/15/2022      Wt Readings from Last 3 Encounters:  03/23/22 178 lb (80.7 kg)  02/15/22 174 lb (78.9 kg)  12/22/21 174 lb (78.9 kg)     ASSESSMENT AND PLAN:  Problem List Items Addressed This Visit       Cardiology Problems   Hypertension associated with diabetes (HCC) - Primary   Hyperlipidemia associated with type 2 diabetes mellitus (HCC)     Other   Diabetes mellitus (HCC)   Other Visit Diagnoses     Shortness of breath       Obesity (BMI 35.0-39.9 without comorbidity)          Shortness of breath, chronic stable angina Unable to exclude ischemia Discussed various treatment options, we will order cardiac CTA Prior echocardiogram with normal ejection fraction, no indication of pulmonary hypertension Reports worsening symptoms since she had COVID Suspect component of deconditioning If no significant ischemia, would consider cardiac rehab  Obesity We have encouraged continued exercise, careful diet management in an effort to lose weight.  Weight is down 10 pounds from prior clinic visit July 2022  Essential hypertension Blood pressure is well controlled on today's visit. No changes made to the  medications.  Hyperlipidemia Previously took herself off Crestor, does not want other medications for hyperlipidemia Cardiac CTA as above to help guide management   Total encounter time more than 30 minutes  Greater than 50% was spent in counseling and coordination of care with the patient    Signed, August 2022, M.D., Ph.D. [facility] [place], [place]

## 2022-03-23 ENCOUNTER — Ambulatory Visit: Payer: Medicare Other | Attending: Cardiovascular Disease | Admitting: Cardiovascular Disease

## 2022-03-23 ENCOUNTER — Other Ambulatory Visit
Admission: RE | Admit: 2022-03-23 | Discharge: 2022-03-23 | Disposition: A | Discharge status: Home / Self Care | Payer: Medicare Other | Source: Ambulatory Visit | Attending: Cardiovascular Disease | Admitting: Cardiovascular Disease

## 2022-03-23 ENCOUNTER — Encounter: Payer: Self-pay | Admitting: Cardiovascular Disease

## 2022-03-23 VITALS — BP 120/60 | HR 66 | Ht 61.0 in | Wt 178.0 lb

## 2022-03-23 DIAGNOSIS — I209 Angina pectoris, unspecified: Secondary | ICD-10-CM | POA: Insufficient documentation

## 2022-03-23 DIAGNOSIS — E669 Obesity, unspecified: Secondary | ICD-10-CM | POA: Diagnosis not present

## 2022-03-23 DIAGNOSIS — R0602 Shortness of breath: Secondary | ICD-10-CM | POA: Diagnosis not present

## 2022-03-23 DIAGNOSIS — E785 Hyperlipidemia, unspecified: Secondary | ICD-10-CM

## 2022-03-23 DIAGNOSIS — E1159 Type 2 diabetes mellitus with other circulatory complications: Secondary | ICD-10-CM | POA: Diagnosis not present

## 2022-03-23 DIAGNOSIS — R072 Precordial pain: Secondary | ICD-10-CM

## 2022-03-23 DIAGNOSIS — E119 Type 2 diabetes mellitus without complications: Secondary | ICD-10-CM | POA: Diagnosis not present

## 2022-03-23 DIAGNOSIS — I152 Hypertension secondary to endocrine disorders: Secondary | ICD-10-CM

## 2022-03-23 DIAGNOSIS — E1169 Type 2 diabetes mellitus with other specified complication: Secondary | ICD-10-CM

## 2022-03-23 DIAGNOSIS — Z794 Long term (current) use of insulin: Secondary | ICD-10-CM

## 2022-03-23 LAB — BASIC METABOLIC PANEL
Anion gap: 8 (ref 5–15)
BUN: 20 mg/dL (ref 8–23)
CO2: 25 mmol/L (ref 22–32)
Calcium: 9.6 mg/dL (ref 8.9–10.3)
Chloride: 104 mmol/L (ref 98–111)
Creatinine, Ser: 0.73 mg/dL (ref 0.44–1.00)
GFR, Estimated: >60 mL/min (ref >60)
Glucose, Bld: 131 mg/dL — ABNORMAL HIGH (ref 70–99)
Potassium: 3.7 mmol/L (ref 3.5–5.1)
Sodium: 137 mmol/L (ref 135–145)

## 2022-03-23 MED ORDER — METOPROLOL TARTRATE 50 MG PO TABS: ORAL_TABLET | ORAL | 0 refills | Status: DC

## 2022-03-23 NOTE — Patient Instructions (Addendum)
Medication Instructions:  Your Physician recommend you continue on your current medication as directed.     If you need a refill on your cardiac medications before your next appointment, please call your pharmacy.   Lab work: Your provider would like for you to have the following labs drawn today: (BMP).   Please go to the [facility] entrance and check in at the front desk.  You do not need an appointment.  They are open from 7am-6 pm.  You will not need to be fasting.   Testing/Procedures: Cardiac CT Angiography (CTA), is a special type of CT scan that uses a computer to produce multi-dimensional views of major blood vessels throughout the body. In CT angiography, a contrast material is injected through an IV to help visualize the blood vessels   Follow-Up: At [facility], you and your health needs are our priority.  As part of our continuing mission to provide you with exceptional heart care, we have created designated Provider Care Teams.  These Care Teams include your primary Cardiologist (physician) and Advanced Practice Providers (APPs -  Physician Assistants and Nurse Practitioners) who all work together to provide you with the care you need, when you need it.  You will need a follow up appointment in 12 months  Providers on your designated Care Team:   Nicolasa Ducking, NP Eula Listen, PA-C Cadence Fransico Michael, [place]  COVID-19 Vaccine Information can be found at: [email or web address] For questions related to vaccine distribution or appointments, please email [email or web address] or call [phone].     Your cardiac CT will be scheduled at one of the below locations:   [facility] [address] [place], [place] [address] [phone]  OR   [facility] [address] [place], [place] [address] [phone]  If scheduled at [facility], please arrive at the [facility] and Children's Entrance (Entrance C2) of [facility] 30 minutes prior to test start time. You can use the FREE valet parking offered at entrance C (encouraged to control the heart rate for the test)  Proceed to the [facility] Radiology Department (first floor) to check-in and test prep.  All radiology patients and guests should use entrance C2 at [facility], accessed from [facility], even though the hospital's physical address listed is [address].    If scheduled at [facility] or [facility], please arrive 15 mins early for check-in and test prep.   Please follow these instructions carefully (unless otherwise directed):  On the Night Before the Test: Be sure to Drink plenty of water. Do not consume any caffeinated/decaffeinated beverages or chocolate 12 hours prior to your test. Do not take any antihistamines 12 hours prior to your test.  On the Day of the Test: Drink plenty of water until 1 hour prior to the test. Do not eat any food 1 hour prior to test. You may take your regular medications prior to the test.  Take metoprolol (Lopressor) 50 mg along with your atenolol-chlorthalidone two hours prior to test. FEMALES- please wear underwire-free bra if available, avoid dresses & tight clothing  After the Test: Drink plenty of water. After receiving IV contrast, you may experience a mild flushed feeling. This is normal. On occasion, you may experience a mild rash up to 24 hours after the test. This is not dangerous. If this occurs, you can take Benadryl 25 mg and increase your fluid  intake. If you experience trouble breathing, this can be serious. If it is severe call 911 IMMEDIATELY. If it is mild, please call our office. If you take any of these medications: Glipizide/Metformin, Avandament, Glucavance, please do not take 48 hours after completing test unless  otherwise instructed.  We will call to schedule your test 2-4 weeks out understanding that some insurance companies will need an authorization prior to the service being performed.   For non-scheduling related questions, please contact the cardiac imaging nurse navigator should you have any questions/concerns: Rockwell [place], Cardiac Imaging Nurse Navigator Larey Brick, Cardiac Imaging Nurse Navigator [facility] Direct Office Dial: [phone]   For scheduling needs, including cancellations and rescheduling, please call [place], [phone].

## 2022-03-31 ENCOUNTER — Encounter (HOSPITAL_COMMUNITY): Payer: Self-pay | Admitting: Emergency Medicine

## 2022-04-13 ENCOUNTER — Telehealth (HOSPITAL_COMMUNITY): Payer: Self-pay | Admitting: *Deleted

## 2022-04-13 NOTE — Telephone Encounter (Signed)
Attempted to call patient regarding upcoming cardiac CT appointment. °Left message on voicemail with name and callback number ° °Ledarrius Beauchaine RN Navigator Cardiac Imaging °[facility] °[phone] Office °[phone] Cell ° °

## 2022-04-14 ENCOUNTER — Telehealth (HOSPITAL_COMMUNITY): Payer: Self-pay | Admitting: *Deleted

## 2022-04-14 ENCOUNTER — Telehealth: Payer: Self-pay | Admitting: Internal Medicine

## 2022-04-14 MED ORDER — DIAZEPAM 2 MG PO TABS: 2.0000 mg | ORAL_TABLET | ORAL | 0 refills | Status: DC

## 2022-04-14 NOTE — Telephone Encounter (Signed)
Reaching out to patient to offer assistance regarding upcoming cardiac imaging study; pt verbalizes understanding of appt date/time, parking situation and where to check in, pre-test NPO status and medications ordered, and verified current allergies; name and call back number provided for further questions should they arise  Larey Brick RN Navigator Cardiac Imaging [facility] [phone] office [phone] cell  Patient to take 50mg  metoprolol tartrate.  She reports being very claustrophobic and requests some anti-anxiety medication.  I informed her I will reach out to Dr. . She was educated on her position in the scanner and would like to attempt the scan given that her head will stay out of the scanner.

## 2022-04-14 NOTE — Telephone Encounter (Signed)
I spoke with Nicole Lynn regarding claustrophobia for tomorrow's CTA.  We have agreed to prescribe diazepam 2 mg, to be taken when instructed by the CT tech.  It may be repeated x 1 if necessary.  I advised her that she will need to have someone drive her home and stay with her after the procedure.  She voiced understanding.  Rx sent to patient's pharmacy.  Yvonne Kendall, MD [facility]

## 2022-04-15 ENCOUNTER — Ambulatory Visit
Admission: RE | Admit: 2022-04-15 | Discharge: 2022-04-15 | Disposition: A | Discharge status: Home / Self Care | Payer: Medicare Other | Source: Ambulatory Visit | Attending: Cardiovascular Disease | Admitting: Cardiovascular Disease

## 2022-04-15 ENCOUNTER — Other Ambulatory Visit: Payer: Self-pay | Admitting: Cardiovascular Disease

## 2022-04-15 DIAGNOSIS — R072 Precordial pain: Secondary | ICD-10-CM | POA: Insufficient documentation

## 2022-04-15 DIAGNOSIS — R931 Abnormal findings on diagnostic imaging of heart and coronary circulation: Secondary | ICD-10-CM

## 2022-04-15 HISTORY — DX: Type 2 diabetes mellitus without complications: E11.9

## 2022-04-15 MED ORDER — METOPROLOL TARTRATE 5 MG/5ML IV SOLN
10.0000 mg | Freq: Once | INTRAVENOUS | Status: AC
  Administered 2022-04-15: 10 mg via INTRAVENOUS

## 2022-04-15 MED ORDER — NITROGLYCERIN 0.4 MG SL SUBL
0.8000 mg | SUBLINGUAL_TABLET | Freq: Once | SUBLINGUAL | Status: AC
  Administered 2022-04-15: 0.8 mg via SUBLINGUAL

## 2022-04-15 MED ORDER — IOHEXOL 350 MG/ML SOLN
85.0000 mL | Freq: Once | INTRAVENOUS | Status: AC | PRN
  Administered 2022-04-15: 85 mL via INTRAVENOUS

## 2022-04-15 NOTE — Progress Notes (Signed)
Patient tolerated CT well. Vital signs stable encourage to drink water throughout day.Reasons explained and verbalized understanding. Ambulated steady gait.   

## 2022-04-27 ENCOUNTER — Telehealth: Payer: Self-pay | Admitting: *Deleted

## 2022-04-27 NOTE — Telephone Encounter (Signed)
-----   Message from Valora Corporal, RN sent at 04/27/2022  1:13 PM EST ----- What dose of crestor would you like her to start on?   ----- Message ----- From: Minna Merritts, MD Sent: 04/25/2022   6:17 PM EST To: [facility] Triage  Cardiac CTA Moderate-sized hiatal hernia noted Coronary disease noted with calcium score 565 No severe stenosis warranting cardiac catheterization There is 50% disease of the LAD, other regions of 25 to 49% blockage The above would not cause the shortness of breath and debility Recommend restarting cholesterol medication such as statins/Crestor Other option includes injectable medication such as Repatha/Praluent given every 2 weeks Goal total cholesterol less than 150, most recently 236 Would start a regular walking program for conditioning, breathing

## 2022-04-30 ENCOUNTER — Telehealth: Payer: Self-pay | Admitting: Cardiovascular Disease

## 2022-04-30 MED ORDER — EZETIMIBE 10 MG PO TABS: 10.0000 mg | ORAL_TABLET | Freq: Every day | ORAL | 3 refills | Status: DC

## 2022-04-30 MED ORDER — ROSUVASTATIN CALCIUM 10 MG PO TABS: 10.0000 mg | ORAL_TABLET | Freq: Every day | ORAL | 3 refills | Status: DC

## 2022-04-30 NOTE — Telephone Encounter (Signed)
Left message to call back.   Minna Merritts, MD  [facility] [place] hours ago (5:45 PM)    Would recommend Crestor 10 mg daily with Zetia 10 mg Repeat lipids in 3 months Fish oil not needed Thx TGollan

## 2022-04-30 NOTE — Telephone Encounter (Signed)
  Follow Up:     Patient said she was calling about her CT results. She said she wants to know if her results are ready, she had it on 04-15-22.

## 2022-04-30 NOTE — Telephone Encounter (Signed)
Pt made aware of test results along with MD's recommendations.  Pt verbalized understanding

## 2022-04-30 NOTE — Telephone Encounter (Signed)
Please see previous encounter

## 2022-05-31 NOTE — Progress Notes (Unsigned)
      Established patient visit   Patient: Nicole Lynn   DOB: Sep 25, 1940   82 y.o. Female  MRN: [id number] Visit Date: 06/01/2022  Today's healthcare provider: [name], PA-C   No chief complaint on file.  Subjective    HPI  ***  Medications: Outpatient Medications Prior to Visit  Medication Sig   atenolol-chlorthalidone (TENORETIC) 50-25 MG tablet TAKE 1 TABLET BY MOUTH DAILY   diazepam (VALIUM) 2 MG tablet Take 1 tablet (2 mg total) by mouth as directed. Take 1 tablet when directed by CT technologist.  May repeat after 15 minutes if needed for claustrophobia.   ezetimibe (ZETIA) 10 MG tablet Take 1 tablet (10 mg total) by mouth daily.   latanoprost (XALATAN) 0.005 % ophthalmic solution Place 1 drop into both eyes at bedtime.   lisinopril (ZESTRIL) 5 MG tablet Take 1 tablet (5 mg total) by mouth daily.   metFORMIN (GLUCOPHAGE) 1000 MG tablet Take 1 tablet (1,000 mg total) by mouth daily with breakfast. (Patient not taking: Reported on 03/23/2022)   metFORMIN (GLUCOPHAGE) 500 MG tablet Take 1 tablet (500 mg total) by mouth daily with breakfast.   metoprolol tartrate (LOPRESSOR) 50 MG tablet TAKE 1 TABLET 2 HR PRIOR TO CARDIAC PROCEDURE   Multiple Vitamins-Minerals (CENTRUM SILVER PO) Take by mouth once.   rosuvastatin (CRESTOR) 10 MG tablet Take 1 tablet (10 mg total) by mouth daily.   Semaglutide,0.25 or 0.5MG /DOS, (OZEMPIC, 0.25 OR 0.5 MG/DOSE,) 2 MG/3ML SOPN Inject 0.25 mg into the skin once a week. (Patient not taking: Reported on 03/23/2022)   zolpidem (AMBIEN) 10 MG tablet Take 1 tablet (10 mg total) by mouth at bedtime as needed for sleep. (Patient not taking: Reported on 03/23/2022)   No facility-administered medications prior to visit.    Review of Systems  {Labs  Heme  Chem  Endocrine  Serology  Results Review (optional):23779}   Objective    There were no vitals taken for this visit. {Show previous vital signs (optional):23777}  Physical Exam   ***  No results found for any visits on 06/01/22.  Assessment & Plan     ***  No follow-ups on file.      {provider attestation***:1}   [name], PA-C  [place] [phone] (phone) [phone] (fax)  [place]

## 2022-06-01 ENCOUNTER — Encounter: Payer: Self-pay | Admitting: Physician Assistant

## 2022-06-01 ENCOUNTER — Ambulatory Visit (INDEPENDENT_AMBULATORY_CARE_PROVIDER_SITE_OTHER): Payer: Medicare Other | Admitting: Physician Assistant

## 2022-06-01 VITALS — BP 121/63 | HR 82 | Temp 97.2°F | Ht 61.0 in | Wt 176.0 lb

## 2022-06-01 DIAGNOSIS — E119 Type 2 diabetes mellitus without complications: Secondary | ICD-10-CM

## 2022-06-01 DIAGNOSIS — E785 Hyperlipidemia, unspecified: Secondary | ICD-10-CM

## 2022-06-01 DIAGNOSIS — E1169 Type 2 diabetes mellitus with other specified complication: Secondary | ICD-10-CM | POA: Diagnosis not present

## 2022-06-01 DIAGNOSIS — E669 Obesity, unspecified: Secondary | ICD-10-CM | POA: Diagnosis not present

## 2022-06-01 MED ORDER — OZEMPIC (0.25 OR 0.5 MG/DOSE) 2 MG/3ML ~~LOC~~ SOPN: 0.5000 mg | PEN_INJECTOR | SUBCUTANEOUS | 0 refills | Status: DC

## 2022-06-01 MED ORDER — EZETIMIBE 10 MG PO TABS: 10.0000 mg | ORAL_TABLET | Freq: Every day | ORAL | 3 refills | Status: DC

## 2022-06-01 MED ORDER — OZEMPIC (0.25 OR 0.5 MG/DOSE) 2 MG/3ML ~~LOC~~ SOPN: 0.5000 mg | PEN_INJECTOR | SUBCUTANEOUS | 1 refills | Status: DC

## 2022-06-01 MED ORDER — ROSUVASTATIN CALCIUM 10 MG PO TABS: 10.0000 mg | ORAL_TABLET | Freq: Every day | ORAL | 3 refills | Status: DC

## 2022-06-03 DIAGNOSIS — H40153 Residual stage of open-angle glaucoma, bilateral: Secondary | ICD-10-CM | POA: Diagnosis not present

## 2022-06-28 ENCOUNTER — Ambulatory Visit
Admission: RE | Admit: 2022-06-28 | Discharge: 2022-06-28 | Disposition: A | Discharge status: Home / Self Care | Payer: Medicare Other | Source: Ambulatory Visit | Attending: Obstetrics and Gynecology | Admitting: Obstetrics and Gynecology

## 2022-06-28 DIAGNOSIS — M8580 Other specified disorders of bone density and structure, unspecified site: Secondary | ICD-10-CM

## 2022-06-28 DIAGNOSIS — Z780 Asymptomatic menopausal state: Secondary | ICD-10-CM | POA: Diagnosis not present

## 2022-06-29 ENCOUNTER — Ambulatory Visit: Payer: Medicare Other | Admitting: Physician Assistant

## 2022-06-29 DIAGNOSIS — D229 Melanocytic nevi, unspecified: Secondary | ICD-10-CM | POA: Diagnosis not present

## 2022-06-29 DIAGNOSIS — L814 Other melanin hyperpigmentation: Secondary | ICD-10-CM | POA: Diagnosis not present

## 2022-06-29 DIAGNOSIS — H00015 Hordeolum externum left lower eyelid: Secondary | ICD-10-CM | POA: Diagnosis not present

## 2022-06-29 DIAGNOSIS — L821 Other seborrheic keratosis: Secondary | ICD-10-CM | POA: Diagnosis not present

## 2022-06-29 NOTE — Progress Notes (Signed)
[place],acting as a Education administrator for [employer], PA-C.,have documented all relevant documentation on the behalf of [name], PA-C,as directed by  [employer], PA-C while in the presence of [employer], PA-C.     Established patient visit   Patient: Nicole Lynn   DOB: 01/17/1941   82 y.o. Female  MRN: [id number] Visit Date: 06/30/2022  Today's healthcare provider: [name], PA-C   CC: HTN, DMII FU  Subjective    HPI  Hypertension, follow-up  BP Readings from Last 3 Encounters:  06/30/22 (!) 100/59  06/01/22 121/63  04/15/22 127/64   Wt Readings from Last 3 Encounters:  06/30/22 174 lb (78.9 kg)  06/01/22 176 lb (79.8 kg)  03/23/22 178 lb (80.7 kg)     She was last seen for hypertension 4 weeks ago.  BP at that visit was as above. Management since that visit includes no changes.  She reports good compliance with treatment. She is not having side effects.   Use of agents associated with hypertension: none and unsure of readings .    Symptoms: No chest pain No chest pressure  No palpitations No syncope  No dyspnea No orthopnea  No paroxysmal nocturnal dyspnea No lower extremity edema   Pertinent labs Lab Results  Component Value Date   CHOL 236 (H) 02/15/2022   HDL 53 02/15/2022   LDLCALC 125 (H) 02/15/2022   TRIG 331 (H) 02/15/2022   CHOLHDL 4.5 (H) 02/15/2022   Lab Results  Component Value Date   NA 139 07/01/2022   K 4.1 07/01/2022   CREATININE 0.74 07/01/2022   GFRNONAA >60 03/23/2022   GLUCOSE 109 (H) 07/01/2022   TSH 2.520 02/15/2022     The 10-year ASCVD risk score (Arnett DK, et al., 2019) is: 35%  --------------------------------------------------------------------------------------------------- Lipid/Cholesterol, Follow-up  Last lipid panel Other pertinent labs  Lab Results  Component Value Date   CHOL 236 (H) 02/15/2022   HDL 53 02/15/2022   LDLCALC 125 (H) 02/15/2022   TRIG 331 (H) 02/15/2022   CHOLHDL  4.5 (H) 02/15/2022   Lab Results  Component Value Date   ALT 15 02/15/2022   AST 16 02/15/2022   PLT 267 02/15/2022   TSH 2.520 02/15/2022     She was last seen for this 4 weeks ago.  Management since that visit includes started on Rosuvastatin.  She reports good compliance with treatment. She is not having side effects.   Symptoms: No chest pain No chest pressure/discomfort  No dyspnea No lower extremity edema  No numbness or tingling of extremity No orthopnea  No palpitations No paroxysmal nocturnal dyspnea  No speech difficulty No syncope    The 10-year ASCVD risk score (Arnett DK, et al., 2019) is: 35%  ---------------------------------------------------------------------------------------------------   Medications: Outpatient Medications Prior to Visit  Medication Sig   atenolol-chlorthalidone (TENORETIC) 50-25 MG tablet TAKE 1 TABLET BY MOUTH DAILY   diazepam (VALIUM) 2 MG tablet Take 1 tablet (2 mg total) by mouth as directed. Take 1 tablet when directed by CT technologist.  May repeat after 15 minutes if needed for claustrophobia.   ezetimibe (ZETIA) 10 MG tablet Take 1 tablet (10 mg total) by mouth daily.   latanoprost (XALATAN) 0.005 % ophthalmic solution Place 1 drop into both eyes at bedtime.   lisinopril (ZESTRIL) 5 MG tablet Take 1 tablet (5 mg total) by mouth daily.   metFORMIN (GLUCOPHAGE) 1000 MG tablet Take 1 tablet (1,000 mg total) by mouth daily with breakfast.  metFORMIN (GLUCOPHAGE) 500 MG tablet Take 1 tablet (500 mg total) by mouth daily with breakfast.   metoprolol tartrate (LOPRESSOR) 50 MG tablet TAKE 1 TABLET 2 HR PRIOR TO CARDIAC PROCEDURE   Multiple Vitamins-Minerals (CENTRUM SILVER PO) Take by mouth once.   rosuvastatin (CRESTOR) 10 MG tablet Take 1 tablet (10 mg total) by mouth daily.   Semaglutide,0.25 or 0.5MG /DOS, (OZEMPIC, 0.25 OR 0.5 MG/DOSE,) 2 MG/3ML SOPN Inject 0.5 mg into the skin once a week.   zolpidem (AMBIEN) 10 MG tablet Take 1  tablet (10 mg total) by mouth at bedtime as needed for sleep.   No facility-administered medications prior to visit.    Review of Systems  All other systems reviewed and are negative. Except see HPI      Objective    BP (!) 100/59 (BP Location: Left Arm, Patient Position: Sitting, Cuff Size: Normal)   Pulse 73   Temp (!) 97.5 F (36.4 C) (Oral)   Wt 174 lb (78.9 kg)   SpO2 97%   BMI 32.88 kg/m    Physical Exam Vitals reviewed.  Constitutional:      General: She is not in acute distress.    Appearance: Normal appearance. She is well-developed. She is obese. She is not diaphoretic.  HENT:     Head: Normocephalic and atraumatic.  Eyes:     General: No scleral icterus.    Conjunctiva/sclera: Conjunctivae normal.  Neck:     Thyroid: No thyromegaly.  Cardiovascular:     Rate and Rhythm: Normal rate and regular rhythm.     Pulses: Normal pulses.     Heart sounds: Normal heart sounds. No murmur heard. Pulmonary:     Effort: Pulmonary effort is normal. No respiratory distress.     Breath sounds: Normal breath sounds. No wheezing, rhonchi or rales.  Musculoskeletal:     Cervical back: Neck supple.     Right lower leg: No edema.     Left lower leg: No edema.  Lymphadenopathy:     Cervical: No cervical adenopathy.  Skin:    General: Skin is warm and dry.     Findings: No rash.  Neurological:     Mental Status: She is alert and oriented to person, place, and time. Mental status is at baseline.  Psychiatric:        Mood and Affect: Mood normal.        Behavior: Behavior normal.      No results found for any visits on 06/30/22.  Assessment & Plan     1. Hyperlipidemia associated with type 2 diabetes mellitus ([place]) Chronic  Uncontrolled  Unable to tolerate statins Continue taking crestor 10mg  and zetia 10mg , started on 06/01/22 Advised low-fat diet and daily exercise as tolerated. Will Fu   2. Hypertension associated with diabetes ([place]) Chronic and  Controlled Continue dietary sodium restrictions  Encourage to increase dietary efforts and physical activity/gardening and regular walking program. Measure BP at home and  Continue her current medication regimen Will refer to CCM if pt agrees  3. Type 2 diabetes mellitus without complication, without long-term current use of insulin ([facility]) Chronic and previously controlled Last A1c 4 mo ago was 7.0 Continue Metformin 1000 mg daily with breakfast. Continue Ozempic 0.5 mg weekly BMP and A1C tests are pending since 05/2022. Will need to complete them after this office visit. Will Fu  4. Obesity, Class I, BMI 30-34.9 Chronic and BMI 32.88 today Pt needs to proceed with strict low-carb, low-fat diet and daily  exercise. Weight loss of 5% of pt's current weight via healthy diet and daily exercise encouraged.  Continue ozempic 0.5 mg once a week. Will refill at next FU  FU as scheduled. Needs to check mental status exam.    The patient was advised to call back or seek an in-person evaluation if the symptoms worsen or if the condition fails to improve as anticipated.  I discussed the assessment and treatment plan with the patient. The patient was provided an opportunity to ask questions and all were answered. The patient agreed with the plan and demonstrated an understanding of the instructions.  I, [name], PA-C have reviewed all documentation for this visit. The documentation on 06/30/22 for the exam, diagnosis, procedures, and orders are all accurate and complete.  [name], [facility], [place] [phone] (phone) [phone] (fax)   [place]

## 2022-06-30 ENCOUNTER — Ambulatory Visit (INDEPENDENT_AMBULATORY_CARE_PROVIDER_SITE_OTHER): Payer: Medicare Other | Admitting: Physician Assistant

## 2022-06-30 VITALS — BP 100/59 | HR 73 | Temp 97.5°F | Wt 174.0 lb

## 2022-06-30 DIAGNOSIS — I152 Hypertension secondary to endocrine disorders: Secondary | ICD-10-CM

## 2022-06-30 DIAGNOSIS — E119 Type 2 diabetes mellitus without complications: Secondary | ICD-10-CM

## 2022-06-30 DIAGNOSIS — E1159 Type 2 diabetes mellitus with other circulatory complications: Secondary | ICD-10-CM | POA: Diagnosis not present

## 2022-06-30 DIAGNOSIS — E1169 Type 2 diabetes mellitus with other specified complication: Secondary | ICD-10-CM

## 2022-06-30 DIAGNOSIS — E785 Hyperlipidemia, unspecified: Secondary | ICD-10-CM

## 2022-06-30 DIAGNOSIS — E669 Obesity, unspecified: Secondary | ICD-10-CM | POA: Diagnosis not present

## 2022-07-01 DIAGNOSIS — E785 Hyperlipidemia, unspecified: Secondary | ICD-10-CM | POA: Diagnosis not present

## 2022-07-01 DIAGNOSIS — E1169 Type 2 diabetes mellitus with other specified complication: Secondary | ICD-10-CM | POA: Diagnosis not present

## 2022-07-02 ENCOUNTER — Encounter: Payer: Self-pay | Admitting: Physician Assistant

## 2022-07-02 LAB — BASIC METABOLIC PANEL
BUN/Creatinine Ratio: 19 (ref 12–28)
BUN: 14 mg/dL (ref 8–27)
CO2: 23 mmol/L (ref 20–29)
Calcium: 9.5 mg/dL (ref 8.7–10.3)
Chloride: 100 mmol/L (ref 96–106)
Creatinine, Ser: 0.74 mg/dL (ref 0.57–1.00)
Glucose: 109 mg/dL — ABNORMAL HIGH (ref 70–99)
Potassium: 4.1 mmol/L (ref 3.5–5.2)
Sodium: 139 mmol/L (ref 134–144)
eGFR: 82 mL/min/{1.73_m2} (ref >59)

## 2022-07-02 LAB — HEMOGLOBIN A1C
Est. average glucose Bld gHb Est-mCnc: 140 mg/dL
Hgb A1c MFr Bld: 6.5 % — ABNORMAL HIGH (ref 4.8–5.6)

## 2022-07-02 NOTE — Progress Notes (Signed)
Please, let pt know that her labs are normal except a slight elevation of BS but A1C is much better than from 4 mo ago. Please, continue her current regimen

## 2022-07-14 ENCOUNTER — Other Ambulatory Visit: Payer: Self-pay | Admitting: Physician Assistant

## 2022-07-14 DIAGNOSIS — I10 Essential (primary) hypertension: Secondary | ICD-10-CM

## 2022-07-14 DIAGNOSIS — K080 Exfoliation of teeth due to systemic causes: Secondary | ICD-10-CM | POA: Diagnosis not present

## 2022-07-15 ENCOUNTER — Other Ambulatory Visit: Payer: Self-pay | Admitting: Physician Assistant

## 2022-07-15 DIAGNOSIS — I10 Essential (primary) hypertension: Secondary | ICD-10-CM

## 2022-07-15 NOTE — Telephone Encounter (Signed)
Duplicate request- filled today Requested Prescriptions  Pending Prescriptions Disp Refills   atenolol-chlorthalidone (TENORETIC) 50-25 MG tablet 90 tablet 0    Sig: Take 1 tablet by mouth daily.     Cardiovascular: Beta Blocker + Diuretic Combos Passed - 07/15/2022 10:44 AM      Passed - K in normal range and within 180 days    Potassium  Date Value Ref Range Status  07/01/2022 4.1 3.5 - 5.2 mmol/L Final  06/21/2013 4.2 3.5 - 5.1 mmol/L Final         Passed - Na in normal range and within 180 days    Sodium  Date Value Ref Range Status  07/01/2022 139 134 - 144 mmol/L Final  06/21/2013 138 136 - 145 mmol/L Final         Passed - Cr in normal range and within 180 days    Creatinine  Date Value Ref Range Status  06/21/2013 0.54 (L) 0.60 - 1.30 mg/dL Final   Creatinine, Ser  Date Value Ref Range Status  07/01/2022 0.74 0.57 - 1.00 mg/dL Final         Passed - eGFR in normal range and within 180 days    EGFR (African American)  Date Value Ref Range Status  06/21/2013 >60  Final   GFR calc Af Amer  Date Value Ref Range Status  05/05/2020 99 >59 mL/min/1.73 Final    Comment:    **In accordance with recommendations from the NKF-ASN Task force,**   [facility] is in the process of updating its eGFR calculation to the   2021 CKD-EPI creatinine equation that estimates kidney function   without a race variable.    EGFR (Non-African Amer.)  Date Value Ref Range Status  06/21/2013 >60  Final    Comment:    eGFR values <30mL/min/1.73 m2 may be an indication of chronic kidney disease (CKD). Calculated eGFR is useful in patients with stable renal function. The eGFR calculation will not be reliable in acutely ill patients when serum creatinine is changing rapidly. It is not useful in  patients on dialysis. The eGFR calculation may not be applicable to patients at the low and high extremes of body sizes, pregnant women, and vegetarians. potassium - Slight hemolysis, interpret  results with  - caution.    GFR, Estimated  Date Value Ref Range Status  03/23/2022 >60 >60 mL/min Final    Comment:    (NOTE) Calculated using the CKD-EPI Creatinine Equation (2021)    eGFR  Date Value Ref Range Status  07/01/2022 82 >59 mL/min/1.73 Final         Passed - Last BP in normal range    BP Readings from Last 1 Encounters:  06/30/22 (!) 100/59         Passed - Last Heart Rate in normal range    Pulse Readings from Last 1 Encounters:  06/30/22 73         Passed - Valid encounter within last 6 months    Recent Outpatient Visits           2 weeks ago Hyperlipidemia associated with type 2 diabetes mellitus ([place])   [place], [place], PA-C   1 month ago Hyperlipidemia associated with type 2 diabetes mellitus ([place])   [place], [place], PA-C   5 months ago Type 2 diabetes mellitus without complication, without long-term current use of insulin [facility])   [place], [place], [place]  8 months ago Type 2 diabetes mellitus without complication, without long-term current use of insulin [facility])   [place], [place], PA-C   10 months ago Shortness of breath   [place], [place], PA-C       Future Appointments             In 3 weeks [name], PA-C [place], [place]

## 2022-07-15 NOTE — Telephone Encounter (Signed)
Medication Refill - Medication: atenolol-chlorthalidone (TENORETIC) 50-25 MG tablet   Has the patient contacted their pharmacy? Yes.     Preferred Pharmacy (with phone number or street name):  [facility] [place], [place] - [address] Phone: [phone]  Fax: [phone]     Has the patient been seen for an appointment in the last year OR does the patient have an upcoming appointment? Yes.    Please assist patient further

## 2022-08-11 ENCOUNTER — Encounter: Payer: Self-pay | Admitting: Physician Assistant

## 2022-08-11 ENCOUNTER — Ambulatory Visit (INDEPENDENT_AMBULATORY_CARE_PROVIDER_SITE_OTHER): Payer: Medicare Other | Admitting: Physician Assistant

## 2022-08-11 VITALS — BP 98/59 | HR 66 | Ht 61.0 in | Wt 174.0 lb

## 2022-08-11 DIAGNOSIS — E1159 Type 2 diabetes mellitus with other circulatory complications: Secondary | ICD-10-CM

## 2022-08-11 DIAGNOSIS — E669 Obesity, unspecified: Secondary | ICD-10-CM

## 2022-08-11 DIAGNOSIS — E1169 Type 2 diabetes mellitus with other specified complication: Secondary | ICD-10-CM | POA: Diagnosis not present

## 2022-08-11 DIAGNOSIS — E119 Type 2 diabetes mellitus without complications: Secondary | ICD-10-CM | POA: Diagnosis not present

## 2022-08-11 DIAGNOSIS — E785 Hyperlipidemia, unspecified: Secondary | ICD-10-CM

## 2022-08-11 DIAGNOSIS — I152 Hypertension secondary to endocrine disorders: Secondary | ICD-10-CM

## 2022-08-11 DIAGNOSIS — E038 Other specified hypothyroidism: Secondary | ICD-10-CM

## 2022-08-11 NOTE — Progress Notes (Signed)
Established patient visit   Patient: Nicole Lynn   DOB: [birth year]   82 y.o. Female  MRN: [id number] Visit Date: 08/11/2022  Today's healthcare provider: [name], PA-C   CC: HTN/HLD/DMII/Obesity FU  Subjective     HPI   6 weeks f/u Last edited by Shelly Bombard, CMA on 08/11/2022  1:07 PM.      Hypertension, follow-up  BP Readings from Last 3 Encounters:  08/11/22 (!) 98/59  06/30/22 (!) 100/59  06/01/22 121/63   Wt Readings from Last 3 Encounters:  08/11/22 174 lb (78.9 kg)  06/30/22 174 lb (78.9 kg)  06/01/22 176 lb (79.8 kg)     She was last seen for hypertension 6 weeks ago.  BP at that visit was 100/59. Management since that visit includes tenoretic 50-25mg , lisinopril  metoprolol .  She reports fair compliance with treatment. She is not having side effects.  She is following a Regular diet. She is not exercising. She does not smoke.   Symptoms: No chest pain No chest pressure  No palpitations No syncope  Yes dyspnea, chronic No orthopnea  No paroxysmal nocturnal dyspnea No lower extremity edema   Pertinent labs Lab Results  Component Value Date   CHOL 236 (H) 02/15/2022   HDL 53 02/15/2022   LDLCALC 125 (H) 02/15/2022   TRIG 331 (H) 02/15/2022   CHOLHDL 4.5 (H) 02/15/2022   Lab Results  Component Value Date   NA 139 07/01/2022   K 4.1 07/01/2022   CREATININE 0.74 07/01/2022   EGFR 82 07/01/2022   GLUCOSE 109 (H) 07/01/2022   TSH 2.520 02/15/2022     The 10-year ASCVD risk score (Arnett DK, et al., 2019) is: 33.9% Lipid/Cholesterol, Follow-up  Last lipid panel Other pertinent labs  Lab Results  Component Value Date   CHOL 236 (H) 02/15/2022   HDL 53 02/15/2022   LDLCALC 125 (H) 02/15/2022   TRIG 331 (H) 02/15/2022   CHOLHDL 4.5 (H) 02/15/2022   Lab Results  Component Value Date   ALT 15 02/15/2022   AST 16 02/15/2022   PLT 267 02/15/2022   TSH 2.520 02/15/2022     She was last seen for this 6 weeks ago.   Management since that visit includes zetia  and crestor .  She reports fair compliance with treatment. She is not having side effects.   The 10-year ASCVD risk score (Arnett DK, et al., 2019) is: 33.9% Diabetes Mellitus Type II, Follow-up  Lab Results  Component Value Date   HGBA1C 6.5 (H) 07/01/2022   HGBA1C 7.0 (H) 02/15/2022   HGBA1C 6.9 (A) 10/29/2021   Wt Readings from Last 3 Encounters:  08/11/22 174 lb (78.9 kg)  06/30/22 174 lb (78.9 kg)  06/01/22 176 lb (79.8 kg)   Last seen for diabetes 6 weeks ago.  Management since then includes metformin  daily, recheck with pt. She reports fair compliance with treatment. She is not having side effects.  Symptoms: Yes fatigue No foot ulcerations  No appetite changes No nausea  No paresthesia of the feet  No polydipsia  No polyuria No visual disturbances   No vomiting      Pertinent Labs: Lab Results  Component Value Date   CHOL 236 (H) 02/15/2022   HDL 53 02/15/2022   LDLCALC 125 (H) 02/15/2022   TRIG 331 (H) 02/15/2022   CHOLHDL 4.5 (H) 02/15/2022   Lab Results  Component Value Date   NA 139 07/01/2022   K  4.1 07/01/2022   CREATININE 0.74 07/01/2022   EGFR 82 07/01/2022   LABMICR 3.9 10/29/2021   MICRALBCREAT 4 10/29/2021     Obesity: Patient complains of obesity. She states that her goal - 170 pounds    Medications: Outpatient Medications Prior to Visit  Medication Sig   atenolol-chlorthalidone (TENORETIC) 50-25 MG tablet TAKE 1 TABLET BY MOUTH DAILY   diazepam (VALIUM) 2 MG tablet Take 1 tablet (2 mg total) by mouth as directed. Take 1 tablet when directed by CT technologist.  May repeat after 15 minutes if needed for claustrophobia.   ezetimibe (ZETIA) 10 MG tablet Take 1 tablet (10 mg total) by mouth daily.   latanoprost (XALATAN) 0.005 % ophthalmic solution Place 1 drop into both eyes at bedtime.   lisinopril (ZESTRIL) 5 MG tablet Take 1 tablet (5 mg total) by mouth daily.   metFORMIN  (GLUCOPHAGE) 1000 MG tablet Take 1 tablet (1,000 mg total) by mouth daily with breakfast.   metFORMIN (GLUCOPHAGE) 500 MG tablet Take 1 tablet (500 mg total) by mouth daily with breakfast.   metoprolol tartrate (LOPRESSOR) 50 MG tablet TAKE 1 TABLET 2 HR PRIOR TO CARDIAC PROCEDURE   Multiple Vitamins-Minerals (CENTRUM SILVER PO) Take by mouth once.   rosuvastatin (CRESTOR) 10 MG tablet Take 1 tablet (10 mg total) by mouth daily.   Semaglutide,0.25 or 0.5MG /DOS, (OZEMPIC, 0.25 OR 0.5 MG/DOSE,) 2 MG/3ML SOPN Inject 0.5 mg into the skin once a week.   zolpidem (AMBIEN) 10 MG tablet Take 1 tablet (10 mg total) by mouth at bedtime as needed for sleep.   No facility-administered medications prior to visit.    Review of Systems  All other systems reviewed and are negative. Except see HPI      Objective    BP (!) 98/59 (BP Location: Left Arm, Patient Position: Sitting, Cuff Size: Normal)   Pulse 66   Ht  (1.549 m)   Wt 174 lb (78.9 kg)   SpO2 99%   BMI 32.88 kg/m    Physical Exam Vitals reviewed.  Constitutional:      General: She is not in acute distress.    Appearance: Normal appearance. She is well-developed. She is not diaphoretic.  HENT:     Head: Normocephalic and atraumatic.  Eyes:     General: No scleral icterus.       Right eye: No discharge.        Left eye: No discharge.     Extraocular Movements: Extraocular movements intact.     Conjunctiva/sclera: Conjunctivae normal.     Pupils: Pupils are equal, round, and reactive to light.  Neck:     Thyroid: No thyromegaly.  Cardiovascular:     Rate and Rhythm: Normal rate and regular rhythm.     Pulses: Normal pulses.     Heart sounds: Normal heart sounds. No murmur heard. Pulmonary:     Effort: Pulmonary effort is normal. No respiratory distress.     Breath sounds: Normal breath sounds. No wheezing, rhonchi or rales.  Abdominal:     General: Abdomen is flat. Bowel sounds are normal. There is no distension.      Palpations: Abdomen is soft. There is no mass.     Tenderness: There is no abdominal tenderness. There is no guarding or rebound.  Musculoskeletal:        General: No swelling.     Cervical back: Normal range of motion and neck supple.     Right lower leg: No edema.  Left lower leg: No edema.  Lymphadenopathy:     Cervical: No cervical adenopathy.  Skin:    General: Skin is warm and dry.     Findings: No rash.  Neurological:     Mental Status: She is alert and oriented to person, place, and time. Mental status is at baseline.  Psychiatric:        Behavior: Behavior normal.        Thought Content: Thought content normal.        Judgment: Judgment normal.      No results found for any visits on 08/11/22.  Assessment & Plan     1. Hyperlipidemia associated with type 2 diabetes mellitus Chronic and previously uncontrolled She is hesitant to take statins because of her spouse's intolerance for statins. Advised to continue taking crestor 10mg  and zetia 10mg , started on 06/01/22 Advised low-fat diet and regular exercise. Pt prefers to "enjoy her life" for diet and do gardening for regular exercise Will FU with labs at her next appt  2. Hypertension associated with diabetes Chronic Her BP today was 98/59 Advised to take 1/2 tablet of tenoretic 50-25 and continue taking lisinopril 5 mg Unclear if she is taking metoprolol, advised to bring her meds to the next appt. Continue to measure her BP at home and advised low sodium diet Will FU  3. Type 2 diabetes mellitus without complication, without long-term current use of insulin Chronic and previously controlled Her last A1C was 6.5 a mo ago GFR 82,improved Continue drinking an adequate amount of water Continue taking Metformin 1500mg  daily/Will need to recheck Advised pt to bring her medications with her to her next appt Will repeat A!C and BMP in June Will FU  4. Obesity, Class I, BMI 30-34.9 Chronic and BMI 32.88  today Advised a strict low carb, low fat diet and daily exercise Pt's target goal is around 170 pounds. Continue ozempic 0.5mg  weekly Will reassess.   No follow-ups on file.      The patient was advised to call back or seek an in-person evaluation if the symptoms worsen or if the condition fails to improve as anticipated.  I discussed the assessment and treatment plan with the patient. The patient was provided an opportunity to ask questions and all were answered. The patient agreed with the plan and demonstrated an understanding of the instructions.  I, [name], PA-C have reviewed all documentation for this visit. The documentation on  08/11/22 for the exam, diagnosis, procedures, and orders are all accurate and complete.  [name], [facility], MMS [facility] [phone] (phone) [phone] (fax)   [facility]

## 2022-08-13 DIAGNOSIS — E669 Obesity, unspecified: Secondary | ICD-10-CM | POA: Insufficient documentation

## 2022-08-13 MED ORDER — OZEMPIC (0.25 OR 0.5 MG/DOSE) 2 MG/3ML ~~LOC~~ SOPN: 0.5000 mg | PEN_INJECTOR | SUBCUTANEOUS | 1 refills | Status: DC

## 2022-08-16 ENCOUNTER — Other Ambulatory Visit: Payer: Self-pay | Admitting: Physician Assistant

## 2022-08-16 DIAGNOSIS — E119 Type 2 diabetes mellitus without complications: Secondary | ICD-10-CM

## 2022-08-17 NOTE — Telephone Encounter (Signed)
Requested Prescriptions  Pending Prescriptions Disp Refills   metFORMIN (GLUCOPHAGE) 500 MG tablet [Pharmacy Med Name: METFORMIN  TABLETS] 90 tablet 0    Sig: TAKE 1 TABLET(500 MG) BY MOUTH DAILY WITH BREAKFAST     Endocrinology:  Diabetes - Biguanides Failed - 08/16/2022  2:21 PM      Failed - B12 Level in normal range and within 720 days    Vitamin B-12  Date Value Ref Range Status  10/31/2019 647 232 - 1,245 pg/mL Final         Passed - Cr in normal range and within 360 days    Creatinine  Date Value Ref Range Status  06/21/2013 0.54 (L) 0.60 - 1.30 mg/dL Final   Creatinine, Ser  Date Value Ref Range Status  07/01/2022 0.74 0.57 - 1.00 mg/dL Final         Passed - HBA1C is between 0 and 7.9 and within 180 days    Hgb A1c MFr Bld  Date Value Ref Range Status  07/01/2022 6.5 (H) 4.8 - 5.6 % Final    Comment:             Prediabetes: 5.7 - 6.4          Diabetes: >6.4          Glycemic control for adults with diabetes: <7.0          Passed - eGFR in normal range and within 360 days    EGFR (African American)  Date Value Ref Range Status  06/21/2013 >60  Final   GFR calc Af Amer  Date Value Ref Range Status  05/05/2020 99 >59 mL/min/1.73 Final    Comment:    **In accordance with recommendations from the NKF-ASN Task force,**   [facility] is in the process of updating its eGFR calculation to the   2021 CKD-EPI creatinine equation that estimates kidney function   without a race variable.    EGFR (Non-African Amer.)  Date Value Ref Range Status  06/21/2013 >60  Final    Comment:    eGFR values <69mL/min/1.73 m2 may be an indication of chronic kidney disease (CKD). Calculated eGFR is useful in patients with stable renal function. The eGFR calculation will not be reliable in acutely ill patients when serum creatinine is changing rapidly. It is not useful in  patients on dialysis. The eGFR calculation may not be applicable to patients at the low and high extremes  of body sizes, pregnant women, and vegetarians. potassium - Slight hemolysis, interpret results with  - caution.    GFR, Estimated  Date Value Ref Range Status  03/23/2022 >60 >60 mL/min Final    Comment:    (NOTE) Calculated using the CKD-EPI Creatinine Equation (2021)    eGFR  Date Value Ref Range Status  07/01/2022 82 >59 mL/min/1.73 Final         Passed - Valid encounter within last 6 months    Recent Outpatient Visits           6 days ago Hyperlipidemia associated with type 2 diabetes mellitus   [facility] [facility] [place], [place], PA-C   1 month ago Hyperlipidemia associated with type 2 diabetes mellitus (HCC)   [place] [facility] [place], [place], PA-C   2 months ago Hyperlipidemia associated with type 2 diabetes mellitus [facility])   [place] [facility] [place], [place], PA-C   6 months ago Type 2 diabetes mellitus without complication, without long-term current use of insulin (HCC)  [place] [facility] [place], [place], [place]   9 months ago Type 2 diabetes mellitus without complication, without long-term current use of insulin (HCC)   [place] [facility] [place], [place], PA-C       Future Appointments             In 1 month [place], [place], PA-C [facility], [facility]            Passed - CBC within normal limits and completed in the last 12 months    WBC  Date Value Ref Range Status  02/15/2022 7.1 3.4 - 10.8 x10E3/uL Final  06/21/2013 4.9 3.6 - 11.0 x10 3/mm 3 Final  09/21/2011 4.6 4.0 - 10.5 K/uL Final   RBC  Date Value Ref Range Status  02/15/2022 4.42 3.77 - 5.28 x10E6/uL Final  06/21/2013 4.28 3.80 - 5.20 X10 6/mm 3 Final  09/21/2011 4.55 3.87 - 5.11 MIL/uL Final   Hemoglobin  Date Value Ref Range Status  02/15/2022 14.4 11.1 - 15.9 g/dL Final   Hematocrit  Date Value Ref Range Status  02/15/2022 42.1 34.0 - 46.6 % Final   MCHC  Date  Value Ref Range Status  02/15/2022 34.2 31.5 - 35.7 g/dL Final  16/01/9603 54.0 32.0 - 36.0 g/dL Final  98/02/9146 82.9 30.0 - 36.0 g/dL Final   [facility]  Date Value Ref Range Status  02/15/2022 32.6 26.6 - 33.0 pg Final  06/21/2013 32.9 26.0 - 34.0 pg Final  09/21/2011 32.1 26.0 - 34.0 pg Final   MCV  Date Value Ref Range Status  02/15/2022 95 79 - 97 fL Final  06/21/2013 97 80 - 100 fL Final   No results found for: "PLTCOUNTKUC", "LABPLAT", "POCPLA" RDW  Date Value Ref Range Status  02/15/2022 12.4 11.7 - 15.4 % Final  06/21/2013 13.2 11.5 - 14.5 % Final

## 2022-08-18 ENCOUNTER — Other Ambulatory Visit: Payer: Self-pay | Admitting: Physician Assistant

## 2022-08-18 DIAGNOSIS — E119 Type 2 diabetes mellitus without complications: Secondary | ICD-10-CM

## 2022-08-19 NOTE — Telephone Encounter (Signed)
Refused Metformin 500 mg refill request because this is a duplicate.

## 2022-09-22 ENCOUNTER — Ambulatory Visit (INDEPENDENT_AMBULATORY_CARE_PROVIDER_SITE_OTHER): Payer: Medicare Other | Admitting: Physician Assistant

## 2022-09-22 VITALS — BP 129/70 | HR 81 | Temp 97.8°F | Resp 16 | Wt 173.0 lb

## 2022-09-22 DIAGNOSIS — E1169 Type 2 diabetes mellitus with other specified complication: Secondary | ICD-10-CM

## 2022-09-22 DIAGNOSIS — F4323 Adjustment disorder with mixed anxiety and depressed mood: Secondary | ICD-10-CM | POA: Diagnosis not present

## 2022-09-22 DIAGNOSIS — I152 Hypertension secondary to endocrine disorders: Secondary | ICD-10-CM

## 2022-09-22 DIAGNOSIS — E669 Obesity, unspecified: Secondary | ICD-10-CM

## 2022-09-22 DIAGNOSIS — E119 Type 2 diabetes mellitus without complications: Secondary | ICD-10-CM

## 2022-09-22 DIAGNOSIS — E1159 Type 2 diabetes mellitus with other circulatory complications: Secondary | ICD-10-CM

## 2022-09-22 DIAGNOSIS — F339 Major depressive disorder, recurrent, unspecified: Secondary | ICD-10-CM

## 2022-09-22 MED ORDER — DULOXETINE HCL 20 MG PO CPEP: 20.0000 mg | ORAL_CAPSULE | Freq: Two times a day (BID) | ORAL | 3 refills | Status: DC

## 2022-09-22 NOTE — Progress Notes (Signed)
Established patient visit    I,Roshena L Chambers,acting as a scribe for [employer], PA-C.,have documented all relevant documentation on the behalf of [name], PA-C,as directed by  [employer], PA-C while in the presence of [employer], PA-C.   Patient: Nicole Lynn   DOB: [birth year]   82 y.o. Female  MRN: [id number] Visit Date: 09/22/2022  Today's healthcare provider: [name], PA-C   Chief Complaint  Patient presents with   Medical Management of Chronic Issues   Subjective    HPI  Hypertension, follow-up  BP Readings from Last 3 Encounters:  09/22/22 129/70  08/11/22 (!) 98/59  06/30/22 (!) 100/59   Wt Readings from Last 3 Encounters:  09/22/22 173 lb (78.5 kg)  08/11/22 174 lb (78.9 kg)  06/30/22 174 lb (78.9 kg)     She was last seen for hypertension 08/11/2022.   BP at that visit was 98/59. Management since that visit includes advised to take 1/2 tablet of tenoretic 50-25 and continue taking lisinopril 5 mg.  She reports good compliance with treatment. She is not having side effects.  She is following a Regular diet. She is exercising (yard work) She does not smoke.  Use of agents associated with hypertension: none.   \  Pertinent labs Lab Results  Component Value Date   CHOL 236 (H) 02/15/2022   HDL 53 02/15/2022   LDLCALC 125 (H) 02/15/2022   TRIG 331 (H) 02/15/2022   CHOLHDL 4.5 (H) 02/15/2022   Lab Results  Component Value Date   NA 139 07/01/2022   K 4.1 07/01/2022   CREATININE 0.74 07/01/2022   EGFR 82 07/01/2022   GLUCOSE 109 (H) 07/01/2022   TSH 2.520 02/15/2022     The 10-year ASCVD risk score (Arnett DK, et al., 2019) is: 51.3%  Pt's best friend and neighbor of 50 years just recently passed away . Pt felt depressed. She has been grieving. She c/o lack of energy.     09/22/2022    1:24 PM 08/11/2022    1:14 PM 06/01/2022    3:50 PM  PHQ9 SCORE ONLY  PHQ-9 Total Score 12 2 2       09/22/2022    1:28 PM   GAD 7 : Generalized Anxiety Score  Nervous, Anxious, on Edge 0  Control/stop worrying 3  Worry too much - different things 3  Trouble relaxing 0  Restless 0  Easily annoyed or irritable 0  Afraid - awful might happen 3  Total GAD 7 Score 9     ---------------------------------------------------------------------------------------------------   Medications: Outpatient Medications Prior to Visit  Medication Sig   atenolol-chlorthalidone (TENORETIC) 50-25 MG tablet TAKE 1 TABLET BY MOUTH DAILY   diazepam (VALIUM) 2 MG tablet Take 1 tablet (2 mg total) by mouth as directed. Take 1 tablet when directed by CT technologist.  May repeat after 15 minutes if needed for claustrophobia.   ezetimibe (ZETIA) 10 MG tablet Take 1 tablet (10 mg total) by mouth daily.   latanoprost (XALATAN) 0.005 % ophthalmic solution Place 1 drop into both eyes at bedtime.   lisinopril (ZESTRIL) 5 MG tablet Take 1 tablet (5 mg total) by mouth daily.   metFORMIN (GLUCOPHAGE) 1000 MG tablet Take 1 tablet (1,000 mg total) by mouth daily with breakfast.   metFORMIN (GLUCOPHAGE) 500 MG tablet TAKE 1 TABLET(500 MG) BY MOUTH DAILY WITH BREAKFAST   metoprolol tartrate (LOPRESSOR) 50 MG tablet TAKE 1 TABLET 2 HR PRIOR TO CARDIAC PROCEDURE  Multiple Vitamins-Minerals (CENTRUM SILVER PO) Take by mouth once.   rosuvastatin (CRESTOR) 10 MG tablet Take 1 tablet (10 mg total) by mouth daily.   Semaglutide,0.25 or 0.5MG /DOS, (OZEMPIC, 0.25 OR 0.5 MG/DOSE,) 2 MG/3ML SOPN Inject 0.5 mg into the skin once a week.   zolpidem (AMBIEN) 10 MG tablet Take 1 tablet (10 mg total) by mouth at bedtime as needed for sleep.   No facility-administered medications prior to visit.    Review of Systems  Constitutional:  Positive for fatigue. Negative for appetite change, chills and fever.  Respiratory:  Negative for chest tightness and shortness of breath.   Cardiovascular:  Negative for chest pain and palpitations.  Gastrointestinal:   Negative for abdominal pain, nausea and vomiting.  Neurological:  Negative for dizziness and weakness.       Objective    BP 129/70   Pulse 81   Temp 97.8 F (36.6 C) (Oral)   Resp 16   Wt 173 lb (78.5 kg)   SpO2 98% Comment: room air  BMI 32.69 kg/m    Physical Exam Constitutional:      General: She is not in acute distress.    Appearance: Normal appearance.  HENT:     Head: Normocephalic.  Eyes:     General:        Right eye: No discharge.        Left eye: No discharge.     Extraocular Movements: Extraocular movements intact.     Conjunctiva/sclera: Conjunctivae normal.     Pupils: Pupils are equal, round, and reactive to light.     Comments: Slightly swelling and puffiness around the eyes from crying  Pulmonary:     Effort: Pulmonary effort is normal. No respiratory distress.  Neurological:     Mental Status: She is alert and oriented to person, place, and time. Mental status is at baseline.  Psychiatric:        Mood and Affect: Mood is depressed.        Speech: Speech normal.        Behavior: Behavior normal. Behavior is cooperative.        Thought Content: Thought content normal. Thought content is not delusional. Thought content does not include homicidal or suicidal ideation. Thought content does not include homicidal or suicidal plan.        Judgment: Judgment normal.      No results found for any visits on 09/22/22.  Assessment & Plan     Problem List Items Addressed This Visit       Cardiovascular and Mediastinum   Hypertension associated with diabetes (HCC)    Chronic BP today was 129.70, at goal Continue taking tenoretic 25-12.5mg  or 1/2 tablet and lisinopril 5mg  for BP and kidney protection Metoprolol? Continue BP log and low sodium diet Will FU        Endocrine   Hyperlipidemia associated with type 2 diabetes mellitus (HCC) - Primary   Diabetes mellitus (HCC)     Other   Obesity, Class I, BMI 30-34.9   Adjustment disorder with mixed  anxiety and depressed mood    New problem Her PHQ9 score was 12 today, moderate depression GAD7 score was 9, mild anxiety Advised to start a trial of cymbalta and reassess in a mo Encouraged to communicate more with her family and with her friends, healthy diet and regular exercise as tolerated. Advised to take cymbalta in the morning There are no hx of frequent falls and fractures for pt. Pt was  advised regarding possible side effects and advised to stop taking this medications if she will experience any side effects. Will FU in a mo      Relevant Medications   DULoxetine (CYMBALTA) 20 MG capsule    No follow-ups on file.      The patient was advised to call back or seek an in-person evaluation if the symptoms worsen or if the condition fails to improve as anticipated.  I discussed the assessment and treatment plan with the patient. The patient was provided an opportunity to ask questions and all were answered. The patient agreed with the plan and demonstrated an understanding of the instructions.  I, [name], PA-C have reviewed all documentation for this visit. The documentation on  09/22/22  for the exam, diagnosis, procedures, and orders are all accurate and complete.  [name], [facility], MMS [facility] [phone] (phone) [phone] (fax)   [facility]

## 2022-09-24 ENCOUNTER — Encounter: Payer: Self-pay | Admitting: Physician Assistant

## 2022-09-24 DIAGNOSIS — F4323 Adjustment disorder with mixed anxiety and depressed mood: Secondary | ICD-10-CM | POA: Insufficient documentation

## 2022-09-24 NOTE — Assessment & Plan Note (Signed)
Chronic BP today was 129.70, at goal Continue taking tenoretic 25-12.5mg  or 1/2 tablet and lisinopril 5mg  for BP and kidney protection Metoprolol? Continue BP log and low sodium diet Will FU

## 2022-09-24 NOTE — Assessment & Plan Note (Signed)
New problem Her PHQ9 score was 12 today, moderate depression GAD7 score was 9, mild anxiety Advised to start a trial of cymbalta and reassess in a mo Encouraged to communicate more with her family and with her friends, healthy diet and regular exercise as tolerated. Advised to take cymbalta in the morning There are no hx of frequent falls and fractures for pt. Pt was advised regarding possible side effects and advised to stop taking this medications if she will experience any side effects. Will FU in a mo

## 2022-10-11 DIAGNOSIS — M9901 Segmental and somatic dysfunction of cervical region: Secondary | ICD-10-CM | POA: Diagnosis not present

## 2022-10-11 DIAGNOSIS — M542 Cervicalgia: Secondary | ICD-10-CM | POA: Diagnosis not present

## 2022-10-11 DIAGNOSIS — M9902 Segmental and somatic dysfunction of thoracic region: Secondary | ICD-10-CM | POA: Diagnosis not present

## 2022-10-11 DIAGNOSIS — H40153 Residual stage of open-angle glaucoma, bilateral: Secondary | ICD-10-CM | POA: Diagnosis not present

## 2022-10-11 DIAGNOSIS — R519 Headache, unspecified: Secondary | ICD-10-CM | POA: Diagnosis not present

## 2022-10-12 DIAGNOSIS — R519 Headache, unspecified: Secondary | ICD-10-CM | POA: Diagnosis not present

## 2022-10-12 DIAGNOSIS — M9902 Segmental and somatic dysfunction of thoracic region: Secondary | ICD-10-CM | POA: Diagnosis not present

## 2022-10-12 DIAGNOSIS — M9901 Segmental and somatic dysfunction of cervical region: Secondary | ICD-10-CM | POA: Diagnosis not present

## 2022-10-12 DIAGNOSIS — M542 Cervicalgia: Secondary | ICD-10-CM | POA: Diagnosis not present

## 2022-10-13 DIAGNOSIS — M9901 Segmental and somatic dysfunction of cervical region: Secondary | ICD-10-CM | POA: Diagnosis not present

## 2022-10-13 DIAGNOSIS — M9902 Segmental and somatic dysfunction of thoracic region: Secondary | ICD-10-CM | POA: Diagnosis not present

## 2022-10-13 DIAGNOSIS — R519 Headache, unspecified: Secondary | ICD-10-CM | POA: Diagnosis not present

## 2022-10-13 DIAGNOSIS — M542 Cervicalgia: Secondary | ICD-10-CM | POA: Diagnosis not present

## 2022-10-14 DIAGNOSIS — M9902 Segmental and somatic dysfunction of thoracic region: Secondary | ICD-10-CM | POA: Diagnosis not present

## 2022-10-14 DIAGNOSIS — R519 Headache, unspecified: Secondary | ICD-10-CM | POA: Diagnosis not present

## 2022-10-14 DIAGNOSIS — M9901 Segmental and somatic dysfunction of cervical region: Secondary | ICD-10-CM | POA: Diagnosis not present

## 2022-10-14 DIAGNOSIS — M542 Cervicalgia: Secondary | ICD-10-CM | POA: Diagnosis not present

## 2022-10-15 DIAGNOSIS — R519 Headache, unspecified: Secondary | ICD-10-CM | POA: Diagnosis not present

## 2022-10-15 DIAGNOSIS — M9901 Segmental and somatic dysfunction of cervical region: Secondary | ICD-10-CM | POA: Diagnosis not present

## 2022-10-15 DIAGNOSIS — M9902 Segmental and somatic dysfunction of thoracic region: Secondary | ICD-10-CM | POA: Diagnosis not present

## 2022-10-15 DIAGNOSIS — M542 Cervicalgia: Secondary | ICD-10-CM | POA: Diagnosis not present

## 2022-10-19 DIAGNOSIS — M9901 Segmental and somatic dysfunction of cervical region: Secondary | ICD-10-CM | POA: Diagnosis not present

## 2022-10-19 DIAGNOSIS — M542 Cervicalgia: Secondary | ICD-10-CM | POA: Diagnosis not present

## 2022-10-19 DIAGNOSIS — M9902 Segmental and somatic dysfunction of thoracic region: Secondary | ICD-10-CM | POA: Diagnosis not present

## 2022-10-19 DIAGNOSIS — R519 Headache, unspecified: Secondary | ICD-10-CM | POA: Diagnosis not present

## 2022-10-20 ENCOUNTER — Encounter: Payer: Self-pay | Admitting: Physician Assistant

## 2022-10-20 ENCOUNTER — Ambulatory Visit (INDEPENDENT_AMBULATORY_CARE_PROVIDER_SITE_OTHER): Payer: Medicare Other | Admitting: Physician Assistant

## 2022-10-20 ENCOUNTER — Ambulatory Visit (INDEPENDENT_AMBULATORY_CARE_PROVIDER_SITE_OTHER): Payer: Medicare Other

## 2022-10-20 VITALS — BP 110/62 | HR 79 | Temp 97.8°F | Resp 14 | Ht 64.0 in | Wt 173.0 lb

## 2022-10-20 VITALS — BP 110/62 | Ht 64.0 in | Wt 173.9 lb

## 2022-10-20 DIAGNOSIS — E669 Obesity, unspecified: Secondary | ICD-10-CM | POA: Diagnosis not present

## 2022-10-20 DIAGNOSIS — K219 Gastro-esophageal reflux disease without esophagitis: Secondary | ICD-10-CM | POA: Diagnosis not present

## 2022-10-20 DIAGNOSIS — Z0000 Encounter for general adult medical examination without abnormal findings: Secondary | ICD-10-CM | POA: Diagnosis not present

## 2022-10-20 DIAGNOSIS — E1159 Type 2 diabetes mellitus with other circulatory complications: Secondary | ICD-10-CM | POA: Diagnosis not present

## 2022-10-20 DIAGNOSIS — I152 Hypertension secondary to endocrine disorders: Secondary | ICD-10-CM

## 2022-10-20 DIAGNOSIS — M9901 Segmental and somatic dysfunction of cervical region: Secondary | ICD-10-CM | POA: Diagnosis not present

## 2022-10-20 DIAGNOSIS — F4323 Adjustment disorder with mixed anxiety and depressed mood: Secondary | ICD-10-CM | POA: Diagnosis not present

## 2022-10-20 DIAGNOSIS — E1169 Type 2 diabetes mellitus with other specified complication: Secondary | ICD-10-CM

## 2022-10-20 DIAGNOSIS — M9902 Segmental and somatic dysfunction of thoracic region: Secondary | ICD-10-CM | POA: Diagnosis not present

## 2022-10-20 DIAGNOSIS — R519 Headache, unspecified: Secondary | ICD-10-CM | POA: Diagnosis not present

## 2022-10-20 DIAGNOSIS — M542 Cervicalgia: Secondary | ICD-10-CM | POA: Diagnosis not present

## 2022-10-20 MED ORDER — BUPROPION HCL ER (SR) 100 MG PO TB12: 100.0000 mg | ORAL_TABLET | Freq: Two times a day (BID) | ORAL | 0 refills | Status: DC

## 2022-10-20 NOTE — Patient Instructions (Signed)
Nicole Lynn , Thank you for taking time to come for your Medicare Wellness Visit. I appreciate your ongoing commitment to your health goals. Please review the following plan we discussed and let me know if I can assist you in the future.   These are the goals we discussed:  Goals      Weight (lb) < 180 lb (81.6 kg)     Starting in 2018, I will work to lose 10 lbs this year.         This is a list of the screening recommended for you and due dates:  Health Maintenance  Topic Date Due   COVID-19 Vaccine (1) Never done   Zoster (Shingles) Vaccine (1 of 2) Never done   Complete foot exam   09/15/2022   Yearly kidney health urinalysis for diabetes  10/30/2022   Flu Shot  11/25/2022   Eye exam for diabetics  12/26/2022   Hemoglobin A1C  01/01/2023   Yearly kidney function blood test for diabetes  07/01/2023   Medicare Annual Wellness Visit  10/20/2023   Pneumonia Vaccine  Completed   DEXA scan (bone density measurement)  Completed   HPV Vaccine  Aged Out   DTaP/Tdap/Td vaccine  Discontinued   Colon Cancer Screening  Discontinued    Advanced directives: yes  Conditions/risks identified: low falls risk  Next appointment: Follow up in one year for your annual wellness visit 10/24/2023 @ 3:30pm   Preventive Care 65 Years and Older, Female Preventive care refers to lifestyle choices and visits with your health care provider that can promote health and wellness. What does preventive care include? A yearly physical exam. This is also called an annual well check. Dental exams once or twice a year. Routine eye exams. Ask your health care provider how often you should have your eyes checked. Personal lifestyle choices, including: Daily care of your teeth and gums. Regular physical activity. Eating a healthy diet. Avoiding tobacco and drug use. Limiting alcohol use. Practicing safe sex. Taking low-dose aspirin every day. Taking vitamin and mineral supplements as recommended by  your health care provider. What happens during an annual well check? The services and screenings done by your health care provider during your annual well check will depend on your age, overall health, lifestyle risk factors, and family history of disease. Counseling  Your health care provider may ask you questions about your: Alcohol use. Tobacco use. Drug use. Emotional well-being. Home and relationship well-being. Sexual activity. Eating habits. History of falls. Memory and ability to understand (cognition). Work and work Astronomer. Reproductive health. Screening  You may have the following tests or measurements: Height, weight, and BMI. Blood pressure. Lipid and cholesterol levels. These may be checked every 5 years, or more frequently if you are over 33 years old. Skin check. Lung cancer screening. You may have this screening every year starting at age 32 if you have a 30-pack-year history of smoking and currently smoke or have quit within the past 15 years. Fecal occult blood test (FOBT) of the stool. You may have this test every year starting at age 10. Flexible sigmoidoscopy or colonoscopy. You may have a sigmoidoscopy every 5 years or a colonoscopy every 10 years starting at age 39. Hepatitis C blood test. Hepatitis B blood test. Sexually transmitted disease (STD) testing. Diabetes screening. This is done by checking your blood sugar (glucose) after you have not eaten for a while (fasting). You may have this done every 1-3 years. Bone density scan. This is  done to screen for osteoporosis. You may have this done starting at age 13. Mammogram. This may be done every 1-2 years. Talk to your health care provider about how often you should have regular mammograms. Talk with your health care provider about your test results, treatment options, and if necessary, the need for more tests. Vaccines  Your health care provider may recommend certain vaccines, such as: Influenza  vaccine. This is recommended every year. Tetanus, diphtheria, and acellular pertussis (Tdap, Td) vaccine. You may need a Td booster every 10 years. Zoster vaccine. You may need this after age 65. Pneumococcal 13-valent conjugate (PCV13) vaccine. One dose is recommended after age 19. Pneumococcal polysaccharide (PPSV23) vaccine. One dose is recommended after age 71. Talk to your health care provider about which screenings and vaccines you need and how often you need them. This information is not intended to replace advice given to you by your health care provider. Make sure you discuss any questions you have with your health care provider. Document Released: 05/09/2015 Document Revised: 12/31/2015 Document Reviewed: 02/11/2015 Elsevier Interactive Patient Education  2017 [employer] Prevention in the Home Falls can cause injuries. They can happen to people of all ages. There are many things you can do to make your home safe and to help prevent falls. What can I do on the outside of my home? Regularly fix the edges of walkways and driveways and fix any cracks. Remove anything that might make you trip as you walk through a door, such as a raised step or threshold. Trim any bushes or trees on the path to your home. Use bright outdoor lighting. Clear any walking paths of anything that might make someone trip, such as rocks or tools. Regularly check to see if handrails are loose or broken. Make sure that both sides of any steps have handrails. Any raised decks and porches should have guardrails on the edges. Have any leaves, snow, or ice cleared regularly. Use sand or salt on walking paths during winter. Clean up any spills in your garage right away. This includes oil or grease spills. What can I do in the bathroom? Use night lights. Install grab bars by the toilet and in the tub and shower. Do not use towel bars as grab bars. Use non-skid mats or decals in the tub or shower. If you  need to sit down in the shower, use a plastic, non-slip stool. Keep the floor dry. Clean up any water that spills on the floor as soon as it happens. Remove soap buildup in the tub or shower regularly. Attach bath mats securely with double-sided non-slip rug tape. Do not have throw rugs and other things on the floor that can make you trip. What can I do in the bedroom? Use night lights. Make sure that you have a light by your bed that is easy to reach. Do not use any sheets or blankets that are too big for your bed. They should not hang down onto the floor. Have a firm chair that has side arms. You can use this for support while you get dressed. Do not have throw rugs and other things on the floor that can make you trip. What can I do in the kitchen? Clean up any spills right away. Avoid walking on wet floors. Keep items that you use a lot in easy-to-reach places. If you need to reach something above you, use a strong step stool that has a grab bar. Keep electrical cords out of  the way. Do not use floor polish or wax that makes floors slippery. If you must use wax, use non-skid floor wax. Do not have throw rugs and other things on the floor that can make you trip. What can I do with my stairs? Do not leave any items on the stairs. Make sure that there are handrails on both sides of the stairs and use them. Fix handrails that are broken or loose. Make sure that handrails are as long as the stairways. Check any carpeting to make sure that it is firmly attached to the stairs. Fix any carpet that is loose or worn. Avoid having throw rugs at the top or bottom of the stairs. If you do have throw rugs, attach them to the floor with carpet tape. Make sure that you have a light switch at the top of the stairs and the bottom of the stairs. If you do not have them, ask someone to add them for you. What else can I do to help prevent falls? Wear shoes that: Do not have high heels. Have rubber  bottoms. Are comfortable and fit you well. Are closed at the toe. Do not wear sandals. If you use a stepladder: Make sure that it is fully opened. Do not climb a closed stepladder. Make sure that both sides of the stepladder are locked into place. Ask someone to hold it for you, if possible. Clearly mark and make sure that you can see: Any grab bars or handrails. First and last steps. Where the edge of each step is. Use tools that help you move around (mobility aids) if they are needed. These include: Canes. Walkers. Scooters. Crutches. Turn on the lights when you go into a dark area. Replace any light bulbs as soon as they burn out. Set up your furniture so you have a clear path. Avoid moving your furniture around. If any of your floors are uneven, fix them. If there are any pets around you, be aware of where they are. Review your medicines with your doctor. Some medicines can make you feel dizzy. This can increase your chance of falling. Ask your doctor what other things that you can do to help prevent falls. This information is not intended to replace advice given to you by your health care provider. Make sure you discuss any questions you have with your health care provider. Document Released: 02/06/2009 Document Revised: 09/18/2015 Document Reviewed: 05/17/2014 Elsevier Interactive Patient Education  2017 [employer].

## 2022-10-20 NOTE — Progress Notes (Signed)
Established patient visit  Patient: Nicole Lynn   DOB: [birth year]   82 y.o. Female  MRN: [id number] Visit Date: 10/20/2022  Today's healthcare provider: [name], PA-C   No chief complaint on file.  Subjective    HPI  *** Discussed the use of AI scribe software for clinical note transcription with the patient, who gave verbal consent to proceed.  History of Present Illness               09/22/2022    1:24 PM 08/11/2022    1:14 PM 06/01/2022    3:50 PM  Depression screen PHQ 2/9  Decreased Interest 3 0 0  Down, Depressed, Hopeless 3 0 0  PHQ - 2 Score 6 0 0  Altered sleeping 3 0 0  Tired, decreased energy 3 2 2   Change in appetite 0 0 0  Feeling bad or failure about yourself  0 0 0  Trouble concentrating 0 0 0  Moving slowly or fidgety/restless 0 0 0  Suicidal thoughts 0 0 0  PHQ-9 Score 12 2 2   Difficult doing work/chores  Not difficult at all       09/22/2022    1:28 PM  GAD 7 : Generalized Anxiety Score  Nervous, Anxious, on Edge 0  Control/stop worrying 3  Worry too much - different things 3  Trouble relaxing 0  Restless 0  Easily annoyed or irritable 0  Afraid - awful might happen 3  Total GAD 7 Score 9    Medications: Outpatient Medications Prior to Visit  Medication Sig   atenolol-chlorthalidone (TENORETIC) 50-25 MG tablet TAKE 1 TABLET BY MOUTH DAILY   diazepam (VALIUM) 2 MG tablet Take 1 tablet (2 mg total) by mouth as directed. Take 1 tablet when directed by CT technologist.  May repeat after 15 minutes if needed for claustrophobia.   DULoxetine (CYMBALTA) 20 MG capsule Take 1 capsule (20 mg total) by mouth 2 (two) times daily.   ezetimibe (ZETIA) 10 MG tablet Take 1 tablet (10 mg total) by mouth daily.   latanoprost (XALATAN) 0.005 % ophthalmic solution Place 1 drop into both eyes at bedtime.   lisinopril (ZESTRIL) 5 MG tablet Take 1 tablet (5 mg total) by mouth daily.   metFORMIN (GLUCOPHAGE) 1000 MG tablet Take 1 tablet (1,000 mg  total) by mouth daily with breakfast.   metFORMIN (GLUCOPHAGE) 500 MG tablet TAKE 1 TABLET(500 MG) BY MOUTH DAILY WITH BREAKFAST   metoprolol tartrate (LOPRESSOR) 50 MG tablet TAKE 1 TABLET 2 HR PRIOR TO CARDIAC PROCEDURE   Multiple Vitamins-Minerals (CENTRUM SILVER PO) Take by mouth once.   rosuvastatin (CRESTOR) 10 MG tablet Take 1 tablet (10 mg total) by mouth daily.   Semaglutide,0.25 or 0.5MG /DOS, (OZEMPIC, 0.25 OR 0.5 MG/DOSE,) 2 MG/3ML SOPN Inject 0.5 mg into the skin once a week.   zolpidem (AMBIEN) 10 MG tablet Take 1 tablet (10 mg total) by mouth at bedtime as needed for sleep.   No facility-administered medications prior to visit.    Review of Systems  All other systems reviewed and are negative. Except see HPI   {Labs  Heme  Chem  Endocrine  Serology  Results Review (optional):23779}   Objective    There were no vitals taken for this visit. {Show previous vital signs (optional):23777}  Physical Exam Vitals reviewed.  Constitutional:      General: She is not in acute distress.    Appearance: Normal appearance. She is well-developed. She is not diaphoretic.  HENT:  Head: Normocephalic and atraumatic.  Eyes:     General: No scleral icterus.    Conjunctiva/sclera: Conjunctivae normal.  Neck:     Thyroid: No thyromegaly.  Cardiovascular:     Rate and Rhythm: Normal rate and regular rhythm.     Pulses: Normal pulses.     Heart sounds: Normal heart sounds. No murmur heard. Pulmonary:     Effort: Pulmonary effort is normal. No respiratory distress.     Breath sounds: Normal breath sounds. No wheezing, rhonchi or rales.  Musculoskeletal:     Cervical back: Neck supple.     Right lower leg: No edema.     Left lower leg: No edema.  Lymphadenopathy:     Cervical: No cervical adenopathy.  Skin:    General: Skin is warm and dry.     Findings: No rash.  Neurological:     Mental Status: She is alert and oriented to person, place, and time. Mental status is at  baseline.  Psychiatric:        Mood and Affect: Mood normal.        Behavior: Behavior normal.     No results found for any visits on 10/20/22.  Assessment & Plan    *** Assessment and Plan              No follow-ups on file.     The patient was advised to call back or seek an in-person evaluation if the symptoms worsen or if the condition fails to improve as anticipated.  I discussed the assessment and treatment plan with the patient. The patient was provided an opportunity to ask questions and all were answered. The patient agreed with the plan and demonstrated an understanding of the instructions.  I, [name], PA-C have reviewed all documentation for this visit. The documentation on  6/26/24for the exam, diagnosis, procedures, and orders are all accurate and complete.  [name], [facility], MMS [facility] [phone] (phone) [phone] (fax)  [facility]

## 2022-10-20 NOTE — Progress Notes (Signed)
Subjective:   Nicole Lynn is a 82 y.o. female who presents for Medicare Annual (Subsequent) preventive examination.  Visit Complete: In person  Patient Medicare AWV questionnaire was completed by the patient on (not done); I have confirmed that all information answered by patient is correct and no changes since this date.  Review of Systems         Objective:    There were no vitals filed for this visit. There is no height or weight on file to calculate BMI.     07/15/2020   10:40 AM 08/13/2016    2:35 PM 06/16/2016    1:17 PM 01/05/2016    8:58 AM 05/16/2015    2:13 PM 02/04/2014    9:45 AM 02/04/2014    9:43 AM  Advanced Directives  Does Patient Have a Medical Advance Directive? Yes Yes Yes Yes Yes  No;Yes  Type of Advance Directive Living will Healthcare Power of [place];Living will Living will;Out of facility DNR (pink MOST or yellow form);Healthcare Power of [employer] of [place];Living will Healthcare Power of [place];Living will;Out of facility DNR (pink MOST or yellow form)  Living will;Healthcare Power of [employer] of Healthcare Power of Attorney in Chart?   Yes   No - copy requested   Would patient like information on creating a medical advance directive?       No - patient declined information    Current Medications (verified) Outpatient Encounter Medications as of 10/20/2022  Medication Sig   atenolol-chlorthalidone (TENORETIC) 50-25 MG tablet TAKE 1 TABLET BY MOUTH DAILY   diazepam (VALIUM) 2 MG tablet Take 1 tablet (2 mg total) by mouth as directed. Take 1 tablet when directed by CT technologist.  May repeat after 15 minutes if needed for claustrophobia.   DULoxetine (CYMBALTA) 20 MG capsule Take 1 capsule (20 mg total) by mouth 2 (two) times daily.   ezetimibe (ZETIA) 10 MG tablet Take 1 tablet (10 mg total) by mouth daily.   latanoprost (XALATAN) 0.005 % ophthalmic solution Place 1 drop into both eyes at bedtime.   lisinopril  (ZESTRIL) 5 MG tablet Take 1 tablet (5 mg total) by mouth daily.   metFORMIN (GLUCOPHAGE) 1000 MG tablet Take 1 tablet (1,000 mg total) by mouth daily with breakfast.   metFORMIN (GLUCOPHAGE) 500 MG tablet TAKE 1 TABLET(500 MG) BY MOUTH DAILY WITH BREAKFAST   metoprolol tartrate (LOPRESSOR) 50 MG tablet TAKE 1 TABLET 2 HR PRIOR TO CARDIAC PROCEDURE   Multiple Vitamins-Minerals (CENTRUM SILVER PO) Take by mouth once.   rosuvastatin (CRESTOR) 10 MG tablet Take 1 tablet (10 mg total) by mouth daily.   Semaglutide,0.25 or 0.5MG /DOS, (OZEMPIC, 0.25 OR 0.5 MG/DOSE,) 2 MG/3ML SOPN Inject 0.5 mg into the skin once a week.   zolpidem (AMBIEN) 10 MG tablet Take 1 tablet (10 mg total) by mouth at bedtime as needed for sleep.   No facility-administered encounter medications on file as of 10/20/2022.    Allergies (verified) Latex   History: Past Medical History:  Diagnosis Date   Anxiety    Chest pain, unspecified    Diabetes mellitus without complication (HCC)    HTN (hypertension)    Menopausal state    Overweight(278.02)    Past Surgical History:  Procedure Laterality Date   ABDOMINAL HYSTERECTOMY  2002   TAH/BSO   APPENDECTOMY  1959   BILATERAL SALPINGOOPHORECTOMY     Family History  Problem Relation Age of Onset   Stroke Mother    Prostate  cancer Father    Cancer Other    Coronary artery disease Other    Stroke Other    Social History   Socioeconomic History   Marital status: Widowed    Spouse name: Not on file   Number of children: Not on file   Years of education: Not on file   Highest education level: Not on file  Occupational History   Not on file  Tobacco Use   Smoking status: Never   Smokeless tobacco: Never  Vaping Use   Vaping Use: Never used  Substance and Sexual Activity   Alcohol use: Yes    Alcohol/week: 3.0 standard drinks of alcohol    Types: 3 Glasses of wine per week   Drug use: No   Sexual activity: Never    Birth control/protection: Surgical, None     Comment: BTL/HYST  Other Topics Concern   Not on file  Social History Narrative   Full time. Regularly exercises.    Social Determinants of Health   Financial Resource Strain: Not on file  Food Insecurity: Not on file  Transportation Needs: Not on file  Physical Activity: Not on file  Stress: Not on file  Social Connections: Not on file    Tobacco Counseling Counseling given: Not Answered   Clinical Intake:                        Activities of Daily Living    06/01/2022    3:51 PM 02/15/2022    1:23 PM  In your present state of health, do you have any difficulty performing the following activities:  Hearing? 0 0  Vision? 0 0  Difficulty concentrating or making decisions? 0 0  Walking or climbing stairs? 0 0  Dressing or bathing? 0 0  Doing errands, shopping? 0 0    Patient Care Team: [name], Cordelia Poche as PCP - General (Physician Assistant) Silverio Lay, MD as Consulting Physician (Obstetrics and Gynecology) Lennon Alstrom, MD as Referring Physician (Dermatology)  Indicate any recent Medical Services you may have received from other than [facility] providers in the past year (date may be approximate).     Assessment:   This is a routine wellness examination for Nicole Lynn.  Hearing/Vision screen No results found.  Dietary issues and exercise activities discussed:     Goals Addressed   None    Depression Screen    09/22/2022    1:24 PM 08/11/2022    1:14 PM 06/01/2022    3:50 PM 02/15/2022    1:23 PM 12/22/2021   10:16 AM 08/14/2021    2:48 PM 06/16/2021    2:49 PM  PHQ 2/9 Scores  PHQ - 2 Score 6 0 0 0 2 0   PHQ- 9 Score 12 2 2  0 6 4   Exception Documentation       Patient refusal    Fall Risk    08/11/2022    1:13 PM 02/15/2022    1:23 PM 12/22/2021   10:15 AM 08/14/2021    2:48 PM 06/16/2021    2:49 PM  Fall Risk   Falls in the past year? 0 0 0 0 0  Number falls in past yr: 0 0 0 0 0  Injury with Fall? 0 0 0 0 0  Risk for fall due to  :  No Fall Risks No Fall Risks    Follow up  Falls evaluation completed Falls evaluation completed Falls evaluation completed  MEDICARE RISK AT HOME:   TIMED UP AND GO:  Was the test performed?  Yes  Length of time to ambulate 10 feet: 8 sec Gait steady and fast without use of assistive device    Cognitive Function:        10/16/2018   10:15 AM 06/16/2016    1:24 PM  6CIT Screen  What Year? 0 points 0 points  What month? 0 points 0 points  What time? 0 points 0 points  Count back from 20 2 points 0 points  Months in reverse 0 points 0 points  Repeat phrase 8 points 6 points  Total Score 10 points 6 points    Immunizations Immunization History  Administered Date(s) Administered   Fluad Quad(high Dose 65+) 03/06/2019, 04/11/2020   Influenza Split 06/13/2012   Influenza, High Dose Seasonal PF 02/18/2014, 01/05/2016, 02/08/2018   Influenza,inj,Quad PF,6+ Mos 01/08/2013, 05/16/2015   Pneumococcal Conjugate-13 05/16/2015   Pneumococcal Polysaccharide-23 01/08/2013   Zoster, Live 10/25/2012    TDAP status: Up to date DISCONTINUED  Flu Vaccine status: Up to date  Pneumococcal vaccine status: Up to date  Covid-19 vaccine status: Declined, Education has been provided regarding the importance of this vaccine but patient still declined. Advised may receive this vaccine at local pharmacy or Health Dept.or vaccine clinic. Aware to provide a copy of the vaccination record if obtained from local pharmacy or Health Dept. Verbalized acceptance and understanding.  Qualifies for Shingles Vaccine? Yes   Zostavax completed No   Shingrix Completed?: No.    Education has been provided regarding the importance of this vaccine. Patient has been advised to call insurance company to determine out of pocket expense if they have not yet received this vaccine. Advised may also receive vaccine at local pharmacy or Health Dept. Verbalized acceptance and understanding.  Screening Tests Health  Maintenance  Topic Date Due   COVID-19 Vaccine (1) Never done   Zoster Vaccines- Shingrix (1 of 2) Never done   FOOT EXAM  09/15/2022   Diabetic kidney evaluation - Urine ACR  10/30/2022   INFLUENZA VACCINE  11/25/2022   Medicare Annual Wellness (AWV)  12/23/2022   OPHTHALMOLOGY EXAM  12/26/2022   HEMOGLOBIN A1C  01/01/2023   Diabetic kidney evaluation - eGFR measurement  07/01/2023   Pneumonia Vaccine 86+ Years old  Completed   DEXA SCAN  Completed   HPV VACCINES  Aged Out   DTaP/Tdap/Td  Discontinued   Colonoscopy  Discontinued    Health Maintenance  Health Maintenance Due  Topic Date Due   COVID-19 Vaccine (1) Never done   Zoster Vaccines- Shingrix (1 of 2) Never done   FOOT EXAM  09/15/2022   Diabetic kidney evaluation - Urine ACR  10/30/2022    Colorectal cancer screening: No longer required.   Mammogram status: No longer required due to age.  Bone Density status: Completed yes. Results reflect: Bone density results: NORMAL. Repeat every 5 years.  Lung Cancer Screening: (Low Dose CT Chest recommended if Age 71-80 years, 20 pack-year currently smoking OR have quit w/in 15years.)  qualify.  does not Lung Cancer Screening Referral: no  Additional Screening:  Hepatitis C Screening: does not qualify; Completed yes  Vision Screening: Recommended annual ophthalmology exams for early detection of glaucoma and other disorders of the eye. Is the patient up to date with their annual eye exam?  Yes  Who is the provider or what is the name of the office in which the patient attends annual eye exams? Dr  Bell If pt is not established with a provider, would they like to be referred to a provider to establish care? No .   Dental Screening: Recommended annual dental exams for proper oral hygiene  Diabetic Foot Exam:   Community Resource Referral / Chronic Care Management: CRR required this visit?  No   CCM required this visit?  No     Plan:     I have personally  reviewed and noted the following in the patient's chart:   Medical and social history Use of alcohol, tobacco or illicit drugs  Current medications and supplements including opioid prescriptions. Patient is not currently taking opioid prescriptions. Functional ability and status Nutritional status Physical activity Advanced directives List of other physicians Hospitalizations, surgeries, and ER visits in previous 12 months Vitals Screenings to include cognitive, depression, and falls Referrals and appointments  In addition, I have reviewed and discussed with patient certain preventive protocols, quality metrics, and best practice recommendations. A written personalized care plan for preventive services as well as general preventive health recommendations were provided to patient.     Sue Lush, LPN   [id number]/[id number]   After Visit Summary: printed and given to pt  Nurse Notes: The patient states she is doing well and has no concerns or questions at this time.

## 2022-10-21 DIAGNOSIS — R519 Headache, unspecified: Secondary | ICD-10-CM | POA: Diagnosis not present

## 2022-10-21 DIAGNOSIS — M542 Cervicalgia: Secondary | ICD-10-CM | POA: Diagnosis not present

## 2022-10-21 DIAGNOSIS — M9901 Segmental and somatic dysfunction of cervical region: Secondary | ICD-10-CM | POA: Diagnosis not present

## 2022-10-21 DIAGNOSIS — M9902 Segmental and somatic dysfunction of thoracic region: Secondary | ICD-10-CM | POA: Diagnosis not present

## 2022-10-25 DIAGNOSIS — R519 Headache, unspecified: Secondary | ICD-10-CM | POA: Diagnosis not present

## 2022-10-25 DIAGNOSIS — M9901 Segmental and somatic dysfunction of cervical region: Secondary | ICD-10-CM | POA: Diagnosis not present

## 2022-10-25 DIAGNOSIS — M542 Cervicalgia: Secondary | ICD-10-CM | POA: Diagnosis not present

## 2022-10-25 DIAGNOSIS — M9902 Segmental and somatic dysfunction of thoracic region: Secondary | ICD-10-CM | POA: Diagnosis not present

## 2022-10-26 DIAGNOSIS — M9902 Segmental and somatic dysfunction of thoracic region: Secondary | ICD-10-CM | POA: Diagnosis not present

## 2022-10-26 DIAGNOSIS — R519 Headache, unspecified: Secondary | ICD-10-CM | POA: Diagnosis not present

## 2022-10-26 DIAGNOSIS — M542 Cervicalgia: Secondary | ICD-10-CM | POA: Diagnosis not present

## 2022-10-26 DIAGNOSIS — M9901 Segmental and somatic dysfunction of cervical region: Secondary | ICD-10-CM | POA: Diagnosis not present

## 2022-10-27 DIAGNOSIS — R519 Headache, unspecified: Secondary | ICD-10-CM | POA: Diagnosis not present

## 2022-10-27 DIAGNOSIS — M9902 Segmental and somatic dysfunction of thoracic region: Secondary | ICD-10-CM | POA: Diagnosis not present

## 2022-10-27 DIAGNOSIS — M542 Cervicalgia: Secondary | ICD-10-CM | POA: Diagnosis not present

## 2022-10-27 DIAGNOSIS — M9901 Segmental and somatic dysfunction of cervical region: Secondary | ICD-10-CM | POA: Diagnosis not present

## 2022-11-02 DIAGNOSIS — R519 Headache, unspecified: Secondary | ICD-10-CM | POA: Diagnosis not present

## 2022-11-02 DIAGNOSIS — M542 Cervicalgia: Secondary | ICD-10-CM | POA: Diagnosis not present

## 2022-11-02 DIAGNOSIS — M9902 Segmental and somatic dysfunction of thoracic region: Secondary | ICD-10-CM | POA: Diagnosis not present

## 2022-11-02 DIAGNOSIS — M9901 Segmental and somatic dysfunction of cervical region: Secondary | ICD-10-CM | POA: Diagnosis not present

## 2022-11-04 DIAGNOSIS — M9901 Segmental and somatic dysfunction of cervical region: Secondary | ICD-10-CM | POA: Diagnosis not present

## 2022-11-04 DIAGNOSIS — R519 Headache, unspecified: Secondary | ICD-10-CM | POA: Diagnosis not present

## 2022-11-04 DIAGNOSIS — M542 Cervicalgia: Secondary | ICD-10-CM | POA: Diagnosis not present

## 2022-11-04 DIAGNOSIS — M9902 Segmental and somatic dysfunction of thoracic region: Secondary | ICD-10-CM | POA: Diagnosis not present

## 2022-11-08 ENCOUNTER — Other Ambulatory Visit: Payer: Self-pay | Admitting: Physician Assistant

## 2022-11-08 DIAGNOSIS — I10 Essential (primary) hypertension: Secondary | ICD-10-CM

## 2022-11-09 DIAGNOSIS — M542 Cervicalgia: Secondary | ICD-10-CM | POA: Diagnosis not present

## 2022-11-09 DIAGNOSIS — M9901 Segmental and somatic dysfunction of cervical region: Secondary | ICD-10-CM | POA: Diagnosis not present

## 2022-11-09 DIAGNOSIS — M9902 Segmental and somatic dysfunction of thoracic region: Secondary | ICD-10-CM | POA: Diagnosis not present

## 2022-11-09 DIAGNOSIS — R519 Headache, unspecified: Secondary | ICD-10-CM | POA: Diagnosis not present

## 2022-11-11 DIAGNOSIS — M542 Cervicalgia: Secondary | ICD-10-CM | POA: Diagnosis not present

## 2022-11-11 DIAGNOSIS — M9901 Segmental and somatic dysfunction of cervical region: Secondary | ICD-10-CM | POA: Diagnosis not present

## 2022-11-11 DIAGNOSIS — M9902 Segmental and somatic dysfunction of thoracic region: Secondary | ICD-10-CM | POA: Diagnosis not present

## 2022-11-11 DIAGNOSIS — R519 Headache, unspecified: Secondary | ICD-10-CM | POA: Diagnosis not present

## 2022-11-16 DIAGNOSIS — M542 Cervicalgia: Secondary | ICD-10-CM | POA: Diagnosis not present

## 2022-11-16 DIAGNOSIS — M9901 Segmental and somatic dysfunction of cervical region: Secondary | ICD-10-CM | POA: Diagnosis not present

## 2022-11-16 DIAGNOSIS — M9902 Segmental and somatic dysfunction of thoracic region: Secondary | ICD-10-CM | POA: Diagnosis not present

## 2022-11-16 DIAGNOSIS — R519 Headache, unspecified: Secondary | ICD-10-CM | POA: Diagnosis not present

## 2022-11-19 ENCOUNTER — Other Ambulatory Visit: Payer: Self-pay | Admitting: Physician Assistant

## 2022-11-19 DIAGNOSIS — R519 Headache, unspecified: Secondary | ICD-10-CM | POA: Diagnosis not present

## 2022-11-19 DIAGNOSIS — M9901 Segmental and somatic dysfunction of cervical region: Secondary | ICD-10-CM | POA: Diagnosis not present

## 2022-11-19 DIAGNOSIS — E119 Type 2 diabetes mellitus without complications: Secondary | ICD-10-CM

## 2022-11-19 DIAGNOSIS — M542 Cervicalgia: Secondary | ICD-10-CM | POA: Diagnosis not present

## 2022-11-19 DIAGNOSIS — M9902 Segmental and somatic dysfunction of thoracic region: Secondary | ICD-10-CM | POA: Diagnosis not present

## 2022-11-19 NOTE — Telephone Encounter (Signed)
Requested by interface surescripts. Future visit in 1 week. Requested Prescriptions  Pending Prescriptions Disp Refills   metFORMIN (GLUCOPHAGE) 500 MG tablet [Pharmacy Med Name: METFORMIN 500MG  TABLETS] 90 tablet 0    Sig: TAKE 1 TABLET(500 MG) BY MOUTH DAILY WITH BREAKFAST     Endocrinology:  Diabetes - Biguanides Failed - 11/19/2022 12:32 PM      Failed - B12 Level in normal range and within 720 days    Vitamin B-12  Date Value Ref Range Status  10/31/2019 647 232 - 1,245 pg/mL Final         Passed - Cr in normal range and within 360 days    Creatinine  Date Value Ref Range Status  06/21/2013 0.54 (L) 0.60 - 1.30 mg/dL Final   Creatinine, Ser  Date Value Ref Range Status  07/01/2022 0.74 0.57 - 1.00 mg/dL Final         Passed - HBA1C is between 0 and 7.9 and within 180 days    Hgb A1c MFr Bld  Date Value Ref Range Status  07/01/2022 6.5 (H) 4.8 - 5.6 % Final    Comment:             Prediabetes: 5.7 - 6.4          Diabetes: >6.4          Glycemic control for adults with diabetes: <7.0          Passed - eGFR in normal range and within 360 days    EGFR (African American)  Date Value Ref Range Status  06/21/2013 >60  Final   GFR calc Af Amer  Date Value Ref Range Status  05/05/2020 99 >59 mL/min/1.73 Final    Comment:    **In accordance with recommendations from the NKF-ASN Task force,**   [facility] is in the process of updating its eGFR calculation to the   2021 CKD-EPI creatinine equation that estimates kidney function   without a race variable.    EGFR (Non-African Amer.)  Date Value Ref Range Status  06/21/2013 >60  Final    Comment:    eGFR values <46mL/min/1.73 m2 may be an indication of chronic kidney disease (CKD). Calculated eGFR is useful in patients with stable renal function. The eGFR calculation will not be reliable in acutely ill patients when serum creatinine is changing rapidly. It is not useful in  patients on dialysis. The eGFR calculation may  not be applicable to patients at the low and high extremes of body sizes, pregnant women, and vegetarians. potassium - Slight hemolysis, interpret results with  - caution.    GFR, Estimated  Date Value Ref Range Status  03/23/2022 >60 >60 mL/min Final    Comment:    (NOTE) Calculated using the CKD-EPI Creatinine Equation (2021)    eGFR  Date Value Ref Range Status  07/01/2022 82 >59 mL/min/1.73 Final         Passed - Valid encounter within last 6 months    Recent Outpatient Visits           1 month ago Adjustment disorder with mixed anxiety and depressed mood   [place] [facility] [place], [place], PA-C   1 month ago Hypertension associated with diabetes [facility])   [place] [facility] [place], [place], PA-C   3 months ago Hyperlipidemia associated with type 2 diabetes mellitus [facility])   [place] [facility] [place], [place], PA-C   4 months ago Hyperlipidemia associated with type 2 diabetes mellitus (  HCC)   [place] [place], [place], PA-C   5 months ago Hyperlipidemia associated with type 2 diabetes mellitus (HCC)   [place] [facility] [place], [place], PA-C       Future Appointments             In 1 week [name], PA-C [facility]            Passed - CBC within normal limits and completed in the last 12 months    WBC  Date Value Ref Range Status  02/15/2022 7.1 3.4 - 10.8 x10E3/uL Final  06/21/2013 4.9 3.6 - 11.0 x10 3/mm 3 Final  09/21/2011 4.6 4.0 - 10.5 K/uL Final   RBC  Date Value Ref Range Status  02/15/2022 4.42 3.77 - 5.28 x10E6/uL Final  06/21/2013 4.28 3.80 - 5.20 X10 6/mm 3 Final  09/21/2011 4.55 3.87 - 5.11 MIL/uL Final   Hemoglobin  Date Value Ref Range Status  02/15/2022 14.4 11.1 - 15.9 g/dL Final   Hematocrit  Date Value Ref Range Status  02/15/2022 42.1 34.0 - 46.6 % Final   MCHC  Date Value Ref Range  Status  02/15/2022 34.2 31.5 - 35.7 g/dL Final  82/95/6213 08.6 32.0 - 36.0 g/dL Final  57/84/6962 95.2 30.0 - 36.0 g/dL Final   [facility]  Date Value Ref Range Status  02/15/2022 32.6 26.6 - 33.0 pg Final  06/21/2013 32.9 26.0 - 34.0 pg Final  09/21/2011 32.1 26.0 - 34.0 pg Final   MCV  Date Value Ref Range Status  02/15/2022 95 79 - 97 fL Final  06/21/2013 97 80 - 100 fL Final   No results found for: "PLTCOUNTKUC", "LABPLAT", "POCPLA" RDW  Date Value Ref Range Status  02/15/2022 12.4 11.7 - 15.4 % Final  06/21/2013 13.2 11.5 - 14.5 % Final

## 2022-11-21 ENCOUNTER — Other Ambulatory Visit: Payer: Self-pay | Admitting: Physician Assistant

## 2022-11-21 DIAGNOSIS — F4323 Adjustment disorder with mixed anxiety and depressed mood: Secondary | ICD-10-CM

## 2022-11-22 DIAGNOSIS — M542 Cervicalgia: Secondary | ICD-10-CM | POA: Diagnosis not present

## 2022-11-22 DIAGNOSIS — R519 Headache, unspecified: Secondary | ICD-10-CM | POA: Diagnosis not present

## 2022-11-22 DIAGNOSIS — M9902 Segmental and somatic dysfunction of thoracic region: Secondary | ICD-10-CM | POA: Diagnosis not present

## 2022-11-22 DIAGNOSIS — M9901 Segmental and somatic dysfunction of cervical region: Secondary | ICD-10-CM | POA: Diagnosis not present

## 2022-11-23 NOTE — Telephone Encounter (Signed)
Requested medication (s) are due for refill today: yes   Requested medication (s) are on the active medication list: yes   Last refill:  10/20/22 #60 0 refills  Future visit scheduled: yes in 1 week  Notes to clinic:  no refills remain. Do you want to refill Rx?     Requested Prescriptions  Pending Prescriptions Disp Refills   buPROPion ER (WELLBUTRIN SR) 100 MG 12 hr tablet [Pharmacy Med Name: BUPROPION SR 100MG  TABLETS (12 H)] 60 tablet 0    Sig: TAKE 1 TABLET(100 MG) BY MOUTH TWICE DAILY     Psychiatry: Antidepressants - bupropion Passed - 11/21/2022  3:11 PM      Passed - Cr in normal range and within 360 days    Creatinine  Date Value Ref Range Status  06/21/2013 0.54 (L) 0.60 - 1.30 mg/dL Final   Creatinine, Ser  Date Value Ref Range Status  07/01/2022 0.74 0.57 - 1.00 mg/dL Final         Passed - AST in normal range and within 360 days    AST  Date Value Ref Range Status  02/15/2022 16 0 - 40 IU/L Final         Passed - ALT in normal range and within 360 days    ALT  Date Value Ref Range Status  02/15/2022 15 0 - 32 IU/L Final         Passed - Last BP in normal range    BP Readings from Last 1 Encounters:  10/20/22 110/62         Passed - Valid encounter within last 6 months    Recent Outpatient Visits           1 month ago Adjustment disorder with mixed anxiety and depressed mood   [place] [facility] [place], [place], PA-C   2 months ago Hypertension associated with diabetes [facility])   [place] [facility] [place], [place], PA-C   3 months ago Hyperlipidemia associated with type 2 diabetes mellitus (HCC)   [place] [facility] [place], [place], PA-C   4 months ago Hyperlipidemia associated with type 2 diabetes mellitus [facility])   [place] [facility] [place], [place], PA-C   5 months ago Hyperlipidemia associated with type 2 diabetes mellitus [facility])   [place] [facility]  [place], [place], PA-C       Future Appointments             In 1 week [name], PA-C [facility]

## 2022-11-24 DIAGNOSIS — M9901 Segmental and somatic dysfunction of cervical region: Secondary | ICD-10-CM | POA: Diagnosis not present

## 2022-11-24 DIAGNOSIS — M542 Cervicalgia: Secondary | ICD-10-CM | POA: Diagnosis not present

## 2022-11-24 DIAGNOSIS — M9902 Segmental and somatic dysfunction of thoracic region: Secondary | ICD-10-CM | POA: Diagnosis not present

## 2022-11-24 DIAGNOSIS — R519 Headache, unspecified: Secondary | ICD-10-CM | POA: Diagnosis not present

## 2022-12-01 ENCOUNTER — Ambulatory Visit: Payer: Medicare Other | Admitting: Physician Assistant

## 2022-12-03 DIAGNOSIS — M542 Cervicalgia: Secondary | ICD-10-CM | POA: Diagnosis not present

## 2022-12-03 DIAGNOSIS — M9902 Segmental and somatic dysfunction of thoracic region: Secondary | ICD-10-CM | POA: Diagnosis not present

## 2022-12-03 DIAGNOSIS — M9901 Segmental and somatic dysfunction of cervical region: Secondary | ICD-10-CM | POA: Diagnosis not present

## 2022-12-03 DIAGNOSIS — R519 Headache, unspecified: Secondary | ICD-10-CM | POA: Diagnosis not present

## 2022-12-06 DIAGNOSIS — M9902 Segmental and somatic dysfunction of thoracic region: Secondary | ICD-10-CM | POA: Diagnosis not present

## 2022-12-06 DIAGNOSIS — R519 Headache, unspecified: Secondary | ICD-10-CM | POA: Diagnosis not present

## 2022-12-06 DIAGNOSIS — M9901 Segmental and somatic dysfunction of cervical region: Secondary | ICD-10-CM | POA: Diagnosis not present

## 2022-12-06 DIAGNOSIS — M542 Cervicalgia: Secondary | ICD-10-CM | POA: Diagnosis not present

## 2022-12-08 DIAGNOSIS — R519 Headache, unspecified: Secondary | ICD-10-CM | POA: Diagnosis not present

## 2022-12-08 DIAGNOSIS — M9901 Segmental and somatic dysfunction of cervical region: Secondary | ICD-10-CM | POA: Diagnosis not present

## 2022-12-08 DIAGNOSIS — M542 Cervicalgia: Secondary | ICD-10-CM | POA: Diagnosis not present

## 2022-12-08 DIAGNOSIS — M9902 Segmental and somatic dysfunction of thoracic region: Secondary | ICD-10-CM | POA: Diagnosis not present

## 2022-12-13 ENCOUNTER — Encounter: Payer: Self-pay | Admitting: Physician Assistant

## 2022-12-13 ENCOUNTER — Ambulatory Visit (INDEPENDENT_AMBULATORY_CARE_PROVIDER_SITE_OTHER): Payer: Medicare Other | Admitting: Physician Assistant

## 2022-12-13 VITALS — BP 141/65 | HR 70 | Ht 61.0 in | Wt 174.2 lb

## 2022-12-13 DIAGNOSIS — I152 Hypertension secondary to endocrine disorders: Secondary | ICD-10-CM

## 2022-12-13 DIAGNOSIS — M9901 Segmental and somatic dysfunction of cervical region: Secondary | ICD-10-CM | POA: Diagnosis not present

## 2022-12-13 DIAGNOSIS — E785 Hyperlipidemia, unspecified: Secondary | ICD-10-CM

## 2022-12-13 DIAGNOSIS — F4323 Adjustment disorder with mixed anxiety and depressed mood: Secondary | ICD-10-CM

## 2022-12-13 DIAGNOSIS — E119 Type 2 diabetes mellitus without complications: Secondary | ICD-10-CM

## 2022-12-13 DIAGNOSIS — E1169 Type 2 diabetes mellitus with other specified complication: Secondary | ICD-10-CM | POA: Diagnosis not present

## 2022-12-13 DIAGNOSIS — E1159 Type 2 diabetes mellitus with other circulatory complications: Secondary | ICD-10-CM

## 2022-12-13 DIAGNOSIS — M9902 Segmental and somatic dysfunction of thoracic region: Secondary | ICD-10-CM | POA: Diagnosis not present

## 2022-12-13 DIAGNOSIS — M542 Cervicalgia: Secondary | ICD-10-CM | POA: Diagnosis not present

## 2022-12-13 DIAGNOSIS — R519 Headache, unspecified: Secondary | ICD-10-CM | POA: Diagnosis not present

## 2022-12-13 NOTE — Progress Notes (Signed)
Established patient visit  Patient: Nicole Lynn   DOB: January 04, 1941   82 y.o. Female  MRN: [id number] Visit Date: 12/13/2022  Today's healthcare provider: [name], PA-C   Chief Complaint  Patient presents with   Medical Management of Chronic Issues    6 week follow up on Hypertension as well as mood (anxiety and depression), Patient stated everything is good    Subjective      History of Present Illness        Depression: She is doing well on Wellbutrin and has no side effects. Prefers to continue taking medication  Hypertension, follow-up  BP Readings from Last 3 Encounters:  12/13/22 (!) 141/65  10/20/22 110/62  10/20/22 110/62   Wt Readings from Last 3 Encounters:  12/13/22 174 lb 3.2 oz (79 kg)  10/20/22 173 lb (78.5 kg)  10/20/22 173 lb 14.4 oz (78.9 kg)     She was last seen for hypertension 6 weeks ago.  BP at that visit was WNL. Management since that visit includes metoprolol and lifestyle modifications.  She reports good compliance with treatment. She is not having side effects.  She is following a Regular diet. She is exercising/gardening She does not smoke.  Symptoms: No chest pain No chest pressure  No palpitations No syncope  No dyspnea No orthopnea  No paroxysmal nocturnal dyspnea No lower extremity edema   Pertinent labs Lab Results  Component Value Date   CHOL 236 (H) 02/15/2022   HDL 53 02/15/2022   LDLCALC 125 (H) 02/15/2022   TRIG 331 (H) 02/15/2022   CHOLHDL 4.5 (H) 02/15/2022   Lab Results  Component Value Date   NA 139 07/01/2022   K 4.1 07/01/2022   CREATININE 0.74 07/01/2022   EGFR 82 07/01/2022   GLUCOSE 109 (H) 07/01/2022   TSH 2.520 02/15/2022     The ASCVD Risk score (Arnett DK, et al., 2019) failed to calculate for the following reasons:   The 2019 ASCVD risk score is only valid for ages 36 to 29  ---------------------------------------------------------------------------------------------------       10/20/2022    3:53 PM 09/22/2022    1:24 PM 08/11/2022    1:14 PM  Depression screen PHQ 2/9  Decreased Interest 0 3 0  Down, Depressed, Hopeless 0 3 0  PHQ - 2 Score 0 6 0  Altered sleeping  3 0  Tired, decreased energy  3 2  Change in appetite  0 0  Feeling bad or failure about yourself   0 0  Trouble concentrating  0 0  Moving slowly or fidgety/restless  0 0  Suicidal thoughts  0 0  PHQ-9 Score  12 2  Difficult doing work/chores   Not difficult at all      09/22/2022    1:28 PM  GAD 7 : Generalized Anxiety Score  Nervous, Anxious, on Edge 0  Control/stop worrying 3  Worry too much - different things 3  Trouble relaxing 0  Restless 0  Easily annoyed or irritable 0  Afraid - awful might happen 3  Total GAD 7 Score 9    Medications: Outpatient Medications Prior to Visit  Medication Sig   buPROPion ER (WELLBUTRIN SR) 100 MG 12 hr tablet TAKE 1 TABLET(100 MG) BY MOUTH TWICE DAILY   DULoxetine (CYMBALTA) 20 MG capsule Take 1 capsule (20 mg total) by mouth 2 (two) times daily.   ezetimibe (ZETIA) 10 MG tablet Take 1 tablet (10 mg total) by mouth daily.   lisinopril (ZESTRIL)  5 MG tablet Take 1 tablet (5 mg total) by mouth daily.   metFORMIN (GLUCOPHAGE) 1000 MG tablet Take 1 tablet (1,000 mg total) by mouth daily with breakfast.   metFORMIN (GLUCOPHAGE) 500 MG tablet TAKE 1 TABLET(500 MG) BY MOUTH DAILY WITH BREAKFAST   metoprolol tartrate (LOPRESSOR) 50 MG tablet TAKE 1 TABLET 2 HR PRIOR TO CARDIAC PROCEDURE   Semaglutide,0.25 or 0.5MG /DOS, (OZEMPIC, 0.25 OR 0.5 MG/DOSE,) 2 MG/3ML SOPN Inject 0.5 mg into the skin once a week.   zolpidem (AMBIEN) 10 MG tablet Take 1 tablet (10 mg total) by mouth at bedtime as needed for sleep.   atenolol-chlorthalidone (TENORETIC) 50-25 MG tablet TAKE 1 TABLET BY MOUTH DAILY (Patient not taking: Reported on 12/13/2022)   diazepam (VALIUM) 2 MG tablet Take 1 tablet (2 mg total) by mouth as directed. Take 1 tablet when directed by CT technologist.   May repeat after 15 minutes if needed for claustrophobia. (Patient not taking: Reported on 12/13/2022)   latanoprost (XALATAN) 0.005 % ophthalmic solution Place 1 drop into both eyes at bedtime. (Patient not taking: Reported on 12/13/2022)   Multiple Vitamins-Minerals (CENTRUM SILVER PO) Take by mouth once. (Patient not taking: Reported on 12/13/2022)   rosuvastatin (CRESTOR) 10 MG tablet Take 1 tablet (10 mg total) by mouth daily. (Patient not taking: Reported on 12/13/2022)   No facility-administered medications prior to visit.    Review of Systems  All other systems reviewed and are negative.  Except see HPI       Objective    BP (!) 141/65 (BP Location: Left Arm, Patient Position: Sitting, Cuff Size: Large)   Pulse 70   Ht 5\' 1"  (1.549 m)   Wt 174 lb 3.2 oz (79 kg)   SpO2 98%   BMI 32.91 kg/m     Physical Exam Vitals reviewed.  Constitutional:      General: She is not in acute distress.    Appearance: Normal appearance. She is well-developed. She is not diaphoretic.  HENT:     Head: Normocephalic and atraumatic.  Eyes:     General: No scleral icterus.    Conjunctiva/sclera: Conjunctivae normal.  Neck:     Thyroid: No thyromegaly.  Cardiovascular:     Rate and Rhythm: Normal rate and regular rhythm.     Pulses: Normal pulses.     Heart sounds: Normal heart sounds. No murmur heard. Pulmonary:     Effort: Pulmonary effort is normal. No respiratory distress.     Breath sounds: Normal breath sounds. No wheezing, rhonchi or rales.  Musculoskeletal:     Cervical back: Neck supple.     Right lower leg: No edema.     Left lower leg: No edema.  Lymphadenopathy:     Cervical: No cervical adenopathy.  Skin:    General: Skin is warm and dry.     Findings: No rash.  Neurological:     Mental Status: She is alert and oriented to person, place, and time. Mental status is at baseline.  Psychiatric:        Mood and Affect: Mood normal.        Behavior: Behavior normal.       No results found for any visits on 12/13/22.  Assessment & Plan         1. Type 2 diabetes mellitus without complication, without long-term current use of insulin (HCC) Chronic and stable. Will recheck her labs Continue taking - CBC with Differential/Platelet - Lipid panel - Hemoglobin A1c - Comprehensive  metabolic panel Will reassess after  receiving lab results Will FU  2. Hyperlipidemia associated with type 2 diabetes mellitus (HCC) Chronic and previously unstable Rechecked: - CBC with Differential/Platelet - Lipid panel - Hemoglobin A1c - Comprehensive metabolic panel Will reassess after  receiving lab results Pt prefers not to take any cholesterol medications  3. Hypertension associated with diabetes (HCC) Chronic and unstable. Per pt due to white coat syndrome Advised to measure her BP at home. Continue taking metoprolol. - CBC with Differential/Platelet - Lipid panel - Hemoglobin A1c - Comprehensive metabolic panel Will reassess after  receiving lab results   4. Adjustment disorder with mixed anxiety and depressed mood Chronic and stable. Well controlled on Wellbitrin  100mg  twice daily Avoid potential interaction with Metoprolol. Will reassess at her next visit.   Return in about 6 weeks (around 01/24/2023).     The patient was advised to call back or seek an in-person evaluation if the symptoms worsen or if the condition fails to improve as anticipated.  I discussed the assessment and treatment plan with the patient. The patient was provided an opportunity to ask questions and all were answered. The patient agreed with the plan and demonstrated an understanding of the instructions.  I, [name], PA-C have reviewed all documentation for this visit. The documentation on  12/13/22  for the exam, diagnosis, procedures, and orders are all accurate and complete.  [name], [facility], MMS [facility] [phone] (phone) [phone]  (fax)  [facility]

## 2022-12-15 DIAGNOSIS — M9902 Segmental and somatic dysfunction of thoracic region: Secondary | ICD-10-CM | POA: Diagnosis not present

## 2022-12-15 DIAGNOSIS — M542 Cervicalgia: Secondary | ICD-10-CM | POA: Diagnosis not present

## 2022-12-15 DIAGNOSIS — M9901 Segmental and somatic dysfunction of cervical region: Secondary | ICD-10-CM | POA: Diagnosis not present

## 2022-12-15 DIAGNOSIS — R519 Headache, unspecified: Secondary | ICD-10-CM | POA: Diagnosis not present

## 2022-12-21 DIAGNOSIS — N760 Acute vaginitis: Secondary | ICD-10-CM | POA: Diagnosis not present

## 2022-12-21 DIAGNOSIS — L259 Unspecified contact dermatitis, unspecified cause: Secondary | ICD-10-CM | POA: Diagnosis not present

## 2022-12-21 DIAGNOSIS — Z01419 Encounter for gynecological examination (general) (routine) without abnormal findings: Secondary | ICD-10-CM | POA: Diagnosis not present

## 2022-12-29 DIAGNOSIS — R519 Headache, unspecified: Secondary | ICD-10-CM | POA: Diagnosis not present

## 2022-12-29 DIAGNOSIS — M9902 Segmental and somatic dysfunction of thoracic region: Secondary | ICD-10-CM | POA: Diagnosis not present

## 2022-12-29 DIAGNOSIS — M9901 Segmental and somatic dysfunction of cervical region: Secondary | ICD-10-CM | POA: Diagnosis not present

## 2022-12-29 DIAGNOSIS — M542 Cervicalgia: Secondary | ICD-10-CM | POA: Diagnosis not present

## 2023-01-03 DIAGNOSIS — M9901 Segmental and somatic dysfunction of cervical region: Secondary | ICD-10-CM | POA: Diagnosis not present

## 2023-01-03 DIAGNOSIS — R519 Headache, unspecified: Secondary | ICD-10-CM | POA: Diagnosis not present

## 2023-01-03 DIAGNOSIS — M542 Cervicalgia: Secondary | ICD-10-CM | POA: Diagnosis not present

## 2023-01-03 DIAGNOSIS — M9902 Segmental and somatic dysfunction of thoracic region: Secondary | ICD-10-CM | POA: Diagnosis not present

## 2023-01-10 DIAGNOSIS — M542 Cervicalgia: Secondary | ICD-10-CM | POA: Diagnosis not present

## 2023-01-10 DIAGNOSIS — R519 Headache, unspecified: Secondary | ICD-10-CM | POA: Diagnosis not present

## 2023-01-10 DIAGNOSIS — M9901 Segmental and somatic dysfunction of cervical region: Secondary | ICD-10-CM | POA: Diagnosis not present

## 2023-01-10 DIAGNOSIS — M9902 Segmental and somatic dysfunction of thoracic region: Secondary | ICD-10-CM | POA: Diagnosis not present

## 2023-01-18 DIAGNOSIS — E1169 Type 2 diabetes mellitus with other specified complication: Secondary | ICD-10-CM | POA: Diagnosis not present

## 2023-01-18 DIAGNOSIS — E785 Hyperlipidemia, unspecified: Secondary | ICD-10-CM | POA: Diagnosis not present

## 2023-01-18 DIAGNOSIS — E1159 Type 2 diabetes mellitus with other circulatory complications: Secondary | ICD-10-CM | POA: Diagnosis not present

## 2023-01-18 DIAGNOSIS — E119 Type 2 diabetes mellitus without complications: Secondary | ICD-10-CM | POA: Diagnosis not present

## 2023-01-19 DIAGNOSIS — K080 Exfoliation of teeth due to systemic causes: Secondary | ICD-10-CM | POA: Diagnosis not present

## 2023-01-19 LAB — COMPREHENSIVE METABOLIC PANEL
ALT: 12 IU/L (ref 0–32)
AST: 18 IU/L (ref 0–40)
Albumin: 4.3 g/dL (ref 3.8–4.8)
Alkaline Phosphatase: 57 IU/L (ref 44–121)
BUN/Creatinine Ratio: 22 (ref 12–28)
BUN: 17 mg/dL (ref 8–27)
Bilirubin Total: 0.5 mg/dL (ref 0.0–1.2)
CO2: 25 mmol/L (ref 20–29)
Calcium: 9.6 mg/dL (ref 8.7–10.3)
Chloride: 100 mmol/L (ref 96–106)
Creatinine, Ser: 0.79 mg/dL (ref 0.57–1.00)
Globulin, Total: 2.1 g/dL (ref 1.5–4.5)
Glucose: 150 mg/dL — ABNORMAL HIGH (ref 70–99)
Potassium: 3.5 mmol/L (ref 3.5–5.2)
Sodium: 142 mmol/L (ref 134–144)
Total Protein: 6.4 g/dL (ref 6.0–8.5)
eGFR: 76 mL/min/{1.73_m2} (ref >59)

## 2023-01-19 LAB — CBC WITH DIFFERENTIAL/PLATELET
Basophils Absolute: 0 10*3/uL (ref 0.0–0.2)
Basos: 1 %
EOS (ABSOLUTE): 0.2 10*3/uL (ref 0.0–0.4)
Eos: 3 %
Hematocrit: 45.8 % (ref 34.0–46.6)
Hemoglobin: 14.7 g/dL (ref 11.1–15.9)
Immature Grans (Abs): 0 10*3/uL (ref 0.0–0.1)
Immature Granulocytes: 0 %
Lymphocytes Absolute: 1.8 10*3/uL (ref 0.7–3.1)
Lymphs: 32 %
MCH: 31.7 pg (ref 26.6–33.0)
MCHC: 32.1 g/dL (ref 31.5–35.7)
MCV: 99 fL — ABNORMAL HIGH (ref 79–97)
Monocytes Absolute: 0.4 10*3/uL (ref 0.1–0.9)
Monocytes: 8 %
Neutrophils Absolute: 3.2 10*3/uL (ref 1.4–7.0)
Neutrophils: 56 %
Platelets: 234 10*3/uL (ref 150–450)
RBC: 4.64 x10E6/uL (ref 3.77–5.28)
RDW: 12.8 % (ref 11.7–15.4)
WBC: 5.7 10*3/uL (ref 3.4–10.8)

## 2023-01-19 LAB — LIPID PANEL
Chol/HDL Ratio: 3 ratio (ref 0.0–4.4)
Cholesterol, Total: 161 mg/dL (ref 100–199)
HDL: 54 mg/dL (ref >39)
LDL Chol Calc (NIH): 81 mg/dL (ref 0–99)
Triglycerides: 151 mg/dL — ABNORMAL HIGH (ref 0–149)
VLDL Cholesterol Cal: 26 mg/dL (ref 5–40)

## 2023-01-19 LAB — HEMOGLOBIN A1C
Est. average glucose Bld gHb Est-mCnc: 160 mg/dL
Hgb A1c MFr Bld: 7.2 % — ABNORMAL HIGH (ref 4.8–5.6)

## 2023-01-20 NOTE — Progress Notes (Signed)
Please, let Nicole Lynn know that her labs were stable for her except elevated A1C/diabetes of 7.2. We will discuss the labs in details at her FOLLOW-UP appointment

## 2023-01-26 ENCOUNTER — Encounter: Payer: Self-pay | Admitting: Physician Assistant

## 2023-01-26 ENCOUNTER — Ambulatory Visit: Payer: Medicare Other | Admitting: Physician Assistant

## 2023-01-26 VITALS — BP 121/59 | HR 71 | Ht 61.0 in | Wt 174.3 lb

## 2023-01-26 DIAGNOSIS — M25473 Effusion, unspecified ankle: Secondary | ICD-10-CM

## 2023-01-26 DIAGNOSIS — I152 Hypertension secondary to endocrine disorders: Secondary | ICD-10-CM

## 2023-01-26 DIAGNOSIS — E66811 Obesity, class 1: Secondary | ICD-10-CM | POA: Diagnosis not present

## 2023-01-26 DIAGNOSIS — Z23 Encounter for immunization: Secondary | ICD-10-CM

## 2023-01-26 DIAGNOSIS — F4323 Adjustment disorder with mixed anxiety and depressed mood: Secondary | ICD-10-CM

## 2023-01-26 DIAGNOSIS — E669 Obesity, unspecified: Secondary | ICD-10-CM

## 2023-01-26 DIAGNOSIS — E1159 Type 2 diabetes mellitus with other circulatory complications: Secondary | ICD-10-CM | POA: Diagnosis not present

## 2023-01-26 DIAGNOSIS — E785 Hyperlipidemia, unspecified: Secondary | ICD-10-CM

## 2023-01-26 DIAGNOSIS — E1169 Type 2 diabetes mellitus with other specified complication: Secondary | ICD-10-CM

## 2023-01-26 DIAGNOSIS — K219 Gastro-esophageal reflux disease without esophagitis: Secondary | ICD-10-CM

## 2023-01-26 DIAGNOSIS — R0602 Shortness of breath: Secondary | ICD-10-CM

## 2023-01-26 DIAGNOSIS — E119 Type 2 diabetes mellitus without complications: Secondary | ICD-10-CM

## 2023-01-26 DIAGNOSIS — Z7984 Long term (current) use of oral hypoglycemic drugs: Secondary | ICD-10-CM

## 2023-01-26 DIAGNOSIS — E038 Other specified hypothyroidism: Secondary | ICD-10-CM

## 2023-01-26 NOTE — Progress Notes (Signed)
Established patient visit  Patient: Nicole Lynn   DOB: [birth year]   82 y.o. Female  MRN: [id number] Visit Date: 01/26/2023  Today's healthcare provider: [name], PA-C   Chief Complaint  Patient presents with   Medical Management of Chronic Issues    6 week follow up, no new concerns today    Medication Refill    One touch vero test strips    Subjective     History of Present Illness         Diabetes Mellitus Type II, Follow-up  Lab Results  Component Value Date   HGBA1C 7.2 (H) 01/18/2023   HGBA1C 6.5 (H) 07/01/2022   HGBA1C 7.0 (H) 02/15/2022   Wt Readings from Last 3 Encounters:  01/26/23 174 lb 4.8 oz (79.1 kg)  12/13/22 174 lb 3.2 oz (79 kg)  10/20/22 173 lb (78.5 kg)   Last seen for diabetes 6 weeks ago.  Management since then includes metformin 1500 mg daily. She reports good compliance with treatment. She is not having side effects.  Symptoms: No fatigue No foot ulcerations  No appetite changes No nausea  No paresthesia of the feet  No polydipsia  No polyuria No visual disturbances   No vomiting     Home blood sugar records:  na  Episodes of hypoglycemia? No     Most Recent Eye Exam: next week, will send a note from her ophthalmology Current exercise: gardening Attended a few birthdays and two weddings where she has been having some tasty but not healthy meals.  Pertinent Labs: Lab Results  Component Value Date   CHOL 161 01/18/2023   HDL 54 01/18/2023   LDLCALC 81 01/18/2023   TRIG 151 (H) 01/18/2023   CHOLHDL 3.0 01/18/2023   Lab Results  Component Value Date   NA 142 01/18/2023   K 3.5 01/18/2023   CREATININE 0.79 01/18/2023   EGFR 76 01/18/2023   MICRALBCREAT 4 10/29/2021     ---------------------------------------------------------------------------------------------------       10/20/2022    3:53 PM 09/22/2022    1:24 PM 08/11/2022    1:14 PM  Depression screen PHQ 2/9  Decreased Interest 0 3 0  Down,  Depressed, Hopeless 0 3 0  PHQ - 2 Score 0 6 0  Altered sleeping  3 0  Tired, decreased energy  3 2  Change in appetite  0 0  Feeling bad or failure about yourself   0 0  Trouble concentrating  0 0  Moving slowly or fidgety/restless  0 0  Suicidal thoughts  0 0  PHQ-9 Score  12 2  Difficult doing work/chores   Not difficult at all      09/22/2022    1:28 PM  GAD 7 : Generalized Anxiety Score  Nervous, Anxious, on Edge 0  Control/stop worrying 3  Worry too much - different things 3  Trouble relaxing 0  Restless 0  Easily annoyed or irritable 0  Afraid - awful might happen 3  Total GAD 7 Score 9    Medications: Outpatient Medications Prior to Visit  Medication Sig   atenolol-chlorthalidone (TENORETIC) 50-25 MG tablet TAKE 1 TABLET BY MOUTH DAILY   buPROPion ER (WELLBUTRIN SR) 100 MG 12 hr tablet TAKE 1 TABLET(100 MG) BY MOUTH TWICE DAILY   metFORMIN (GLUCOPHAGE) 500 MG tablet TAKE 1 TABLET(500 MG) BY MOUTH DAILY WITH BREAKFAST   rosuvastatin (CRESTOR) 10 MG tablet Take 1 tablet (10 mg total) by mouth daily.   diazepam (VALIUM) 2 MG tablet  Take 1 tablet (2 mg total) by mouth as directed. Take 1 tablet when directed by CT technologist.  May repeat after 15 minutes if needed for claustrophobia. (Patient not taking: Reported on 12/13/2022)   DULoxetine (CYMBALTA) 20 MG capsule Take 1 capsule (20 mg total) by mouth 2 (two) times daily. (Patient not taking: Reported on 01/26/2023)   ezetimibe (ZETIA) 10 MG tablet Take 1 tablet (10 mg total) by mouth daily. (Patient not taking: Reported on 01/26/2023)   latanoprost (XALATAN) 0.005 % ophthalmic solution Place 1 drop into both eyes at bedtime. (Patient not taking: Reported on 12/13/2022)   lisinopril (ZESTRIL) 5 MG tablet Take 1 tablet (5 mg total) by mouth daily. (Patient not taking: Reported on 01/26/2023)   metFORMIN (GLUCOPHAGE) 1000 MG tablet Take 1 tablet (1,000 mg total) by mouth daily with breakfast. (Patient not taking: Reported on  01/26/2023)   metoprolol tartrate (LOPRESSOR) 50 MG tablet TAKE 1 TABLET 2 HR PRIOR TO CARDIAC PROCEDURE (Patient not taking: Reported on 01/26/2023)   Multiple Vitamins-Minerals (CENTRUM SILVER PO) Take by mouth once. (Patient not taking: Reported on 12/13/2022)   Semaglutide,0.25 or 0.5MG /DOS, (OZEMPIC, 0.25 OR 0.5 MG/DOSE,) 2 MG/3ML SOPN Inject 0.5 mg into the skin once a week. (Patient not taking: Reported on 01/26/2023)   zolpidem (AMBIEN) 10 MG tablet Take 1 tablet (10 mg total) by mouth at bedtime as needed for sleep. (Patient not taking: Reported on 01/26/2023)   No facility-administered medications prior to visit.    Review of Systems  All other systems reviewed and are negative.  Except see HPI       Objective    BP (!) 121/59 (BP Location: Left Arm, Patient Position: Sitting, Cuff Size: Large)   Pulse 71   Ht 5\' 1"  (1.549 m)   Wt 174 lb 4.8 oz (79.1 kg)   SpO2 94%   BMI 32.93 kg/m     Physical Exam Vitals reviewed.  Constitutional:      General: She is not in acute distress.    Appearance: Normal appearance. She is well-developed. She is obese. She is not diaphoretic.  HENT:     Head: Normocephalic and atraumatic.  Eyes:     General: No scleral icterus.    Conjunctiva/sclera: Conjunctivae normal.  Neck:     Thyroid: No thyromegaly.  Cardiovascular:     Rate and Rhythm: Normal rate and regular rhythm.     Pulses: Normal pulses.     Heart sounds: Normal heart sounds. No murmur heard. Pulmonary:     Effort: Pulmonary effort is normal. No respiratory distress.     Breath sounds: Normal breath sounds. No wheezing, rhonchi or rales.  Musculoskeletal:     Cervical back: Neck supple.     Right lower leg: No edema.     Left lower leg: No edema.  Lymphadenopathy:     Cervical: No cervical adenopathy.  Skin:    General: Skin is warm and dry.     Findings: No rash.  Neurological:     Mental Status: She is alert and oriented to person, place, and time. Mental status  is at baseline.  Psychiatric:        Mood and Affect: Mood normal.        Behavior: Behavior normal.      No results found for any visits on 01/26/23.  Assessment & Plan    Type 2 diabetes mellitus without complication, without long-term current use of insulin (HCC) Chronic and stable A1C was 7.2 from  12/2022 Continue taking ozempic 0.25 mg, metformin 1500mg  GFR was 76 vs 82 7 month ago. Advised to drink plenty of water Continue an adherence to low carb diet and daily exercise Will FU  Hyperlipidemia associated with type 2 diabetes mellitus (HCC) Chronic and stable Last LDL was 81 vs 125 11 month ago Contineu rosuvastatin 10mg  , zetia 10mg  Continue low cholesterol diet and regular exercise Will Follow-up  Hypertension associated with diabetes (HCC) Chronic and stable Continue taking tenoretic 50/12.5, lisinopril 5mg , metoprolol 50mg  Continue low salt diet and exercise Will Follow-up  Obesity, Class I, BMI 30-34.9 Chronic and current BMI 32.93 Continue healthy diet and daily exercise Continue ozempic 0.25 mg weekly Will follow-up  Shortness of breath Chronic and most likely associated with deconditioning Continue healthy diet and activities as tolerated Will follow-up  Ankle swelling, unspecified laterality Chronic and stable Continue compression stockings and raising legs up  Need for immunization against influenza - Flu Vaccine Trivalent High Dose (Fluad)    No follow-ups on file.     The patient was advised to call back or seek an in-person evaluation if the symptoms worsen or if the condition fails to improve as anticipated.  I discussed the assessment and treatment plan with the patient. The patient was provided an opportunity to ask questions and all were answered. The patient agreed with the plan and demonstrated an understanding of the instructions.  I, [name], PA-C have reviewed all documentation for this visit. The documentation on  01/26/23  for  the exam, diagnosis, procedures, and orders are all accurate and complete.  [name], [place], MMS [facility] [phone] (phone) [phone] (fax)  [facility]

## 2023-01-28 ENCOUNTER — Other Ambulatory Visit: Payer: Self-pay | Admitting: Physician Assistant

## 2023-01-28 DIAGNOSIS — F4323 Adjustment disorder with mixed anxiety and depressed mood: Secondary | ICD-10-CM

## 2023-01-28 NOTE — Progress Notes (Incomplete)
Established patient visit  Patient: Nicole Lynn   DOB: 1940/05/04   82 y.o. Female  MRN: [id number] Visit Date: 01/26/2023  Today's healthcare provider: [name], PA-C   Chief Complaint  Patient presents with  . Medical Management of Chronic Issues    6 week follow up, no new concerns today   . Medication Refill    One touch vero test strips    Subjective     History of Present Illness         Diabetes Mellitus Type II, Follow-up  Lab Results  Component Value Date   HGBA1C 7.2 (H) 01/18/2023   HGBA1C 6.5 (H) 07/01/2022   HGBA1C 7.0 (H) 02/15/2022   Wt Readings from Last 3 Encounters:  01/26/23 174 lb 4.8 oz (79.1 kg)  12/13/22 174 lb 3.2 oz (79 kg)  10/20/22 173 lb (78.5 kg)   Last seen for diabetes 6 weeks ago.  Management since then includes ***. She reports good compliance with treatment. She is not having side effects.  Symptoms: No fatigue No foot ulcerations  No appetite changes No nausea  No paresthesia of the feet  No polydipsia  No polyuria No visual disturbances   No vomiting     Home blood sugar records:  na  Episodes of hypoglycemia? No     Most Recent Eye Exam: next week, will send a  Current exercise: gardening Attended a few birthdays and two weddings where she has been having some tasty but not healthy meals.  Pertinent Labs: Lab Results  Component Value Date   CHOL 161 01/18/2023   HDL 54 01/18/2023   LDLCALC 81 01/18/2023   TRIG 151 (H) 01/18/2023   CHOLHDL 3.0 01/18/2023   Lab Results  Component Value Date   NA 142 01/18/2023   K 3.5 01/18/2023   CREATININE 0.79 01/18/2023   EGFR 76 01/18/2023   MICRALBCREAT 4 10/29/2021     ---------------------------------------------------------------------------------------------------       10/20/2022    3:53 PM 09/22/2022    1:24 PM 08/11/2022    1:14 PM  Depression screen PHQ 2/9  Decreased Interest 0 3 0  Down, Depressed, Hopeless 0 3 0  PHQ - 2 Score 0 6 0   Altered sleeping  3 0  Tired, decreased energy  3 2  Change in appetite  0 0  Feeling bad or failure about yourself   0 0  Trouble concentrating  0 0  Moving slowly or fidgety/restless  0 0  Suicidal thoughts  0 0  PHQ-9 Score  12 2  Difficult doing work/chores   Not difficult at all      09/22/2022    1:28 PM  GAD 7 : Generalized Anxiety Score  Nervous, Anxious, on Edge 0  Control/stop worrying 3  Worry too much - different things 3  Trouble relaxing 0  Restless 0  Easily annoyed or irritable 0  Afraid - awful might happen 3  Total GAD 7 Score 9    Medications: Outpatient Medications Prior to Visit  Medication Sig  . atenolol-chlorthalidone (TENORETIC) 50-25 MG tablet TAKE 1 TABLET BY MOUTH DAILY  . buPROPion ER (WELLBUTRIN SR) 100 MG 12 hr tablet TAKE 1 TABLET(100 MG) BY MOUTH TWICE DAILY  . metFORMIN (GLUCOPHAGE) 500 MG tablet TAKE 1 TABLET(500 MG) BY MOUTH DAILY WITH BREAKFAST  . rosuvastatin (CRESTOR) 10 MG tablet Take 1 tablet (10 mg total) by mouth daily.  . diazepam (VALIUM) 2 MG tablet Take 1 tablet (2 mg total)  by mouth as directed. Take 1 tablet when directed by CT technologist.  May repeat after 15 minutes if needed for claustrophobia. (Patient not taking: Reported on 12/13/2022)  . DULoxetine (CYMBALTA) 20 MG capsule Take 1 capsule (20 mg total) by mouth 2 (two) times daily. (Patient not taking: Reported on 01/26/2023)  . ezetimibe (ZETIA) 10 MG tablet Take 1 tablet (10 mg total) by mouth daily. (Patient not taking: Reported on 01/26/2023)  . latanoprost (XALATAN) 0.005 % ophthalmic solution Place 1 drop into both eyes at bedtime. (Patient not taking: Reported on 12/13/2022)  . lisinopril (ZESTRIL) 5 MG tablet Take 1 tablet (5 mg total) by mouth daily. (Patient not taking: Reported on 01/26/2023)  . metFORMIN (GLUCOPHAGE) 1000 MG tablet Take 1 tablet (1,000 mg total) by mouth daily with breakfast. (Patient not taking: Reported on 01/26/2023)  . metoprolol tartrate  (LOPRESSOR) 50 MG tablet TAKE 1 TABLET 2 HR PRIOR TO CARDIAC PROCEDURE (Patient not taking: Reported on 01/26/2023)  . Multiple Vitamins-Minerals (CENTRUM SILVER PO) Take by mouth once. (Patient not taking: Reported on 12/13/2022)  . Semaglutide,0.25 or 0.5MG /DOS, (OZEMPIC, 0.25 OR 0.5 MG/DOSE,) 2 MG/3ML SOPN Inject 0.5 mg into the skin once a week. (Patient not taking: Reported on 01/26/2023)  . zolpidem (AMBIEN) 10 MG tablet Take 1 tablet (10 mg total) by mouth at bedtime as needed for sleep. (Patient not taking: Reported on 01/26/2023)   No facility-administered medications prior to visit.    Review of Systems  All other systems reviewed and are negative.  Except see HPI   {Insert previous labs (optional):23779} {See past labs  Heme  Chem  Endocrine  Serology  Results Review (optional):1}   Objective    BP (!) 121/59 (BP Location: Left Arm, Patient Position: Sitting, Cuff Size: Large)   Pulse 71   Ht 5\' 1"  (1.549 m)   Wt 174 lb 4.8 oz (79.1 kg)   SpO2 94%   BMI 32.93 kg/m  {Insert last BP/Wt (optional):23777}{See vitals history (optional):1}   Physical Exam Vitals reviewed.  Constitutional:      General: She is not in acute distress.    Appearance: Normal appearance. She is well-developed. She is obese. She is not diaphoretic.  HENT:     Head: Normocephalic and atraumatic.  Eyes:     General: No scleral icterus.    Conjunctiva/sclera: Conjunctivae normal.  Neck:     Thyroid: No thyromegaly.  Cardiovascular:     Rate and Rhythm: Normal rate and regular rhythm.     Pulses: Normal pulses.     Heart sounds: Normal heart sounds. No murmur heard. Pulmonary:     Effort: Pulmonary effort is normal. No respiratory distress.     Breath sounds: Normal breath sounds. No wheezing, rhonchi or rales.  Musculoskeletal:     Cervical back: Neck supple.     Right lower leg: No edema.     Left lower leg: No edema.  Lymphadenopathy:     Cervical: No cervical adenopathy.  Skin:     General: Skin is warm and dry.     Findings: No rash.  Neurological:     Mental Status: She is alert and oriented to person, place, and time. Mental status is at baseline.  Psychiatric:        Mood and Affect: Mood normal.        Behavior: Behavior normal.      No results found for any visits on 01/26/23.  Assessment & Plan    Type 2 diabetes  mellitus without complication, without long-term current use of insulin (HCC) Chronic and stable A1C was 7.2 from 12/2022 Continue taking ozempic 0.25 mg, metformin 500mg  Continue an adherence to low carb diet and daily exercise Will FU  Hyperlipidemia associated with type 2 diabetes mellitus (HCC) Chronic and stable Last LDL was 81 vs 125 11 month ago Contineu rosuvastatin 10mg  , zetia 10mg  Continue low cholesterol diet and regular exercise Will Follow-up  Hypertension associated with diabetes (HCC) Chronic and stable Continue taking tenoretic 50/12.5, lisinopril 5mg , metoprolol 50mg  Continue low salt diet and exercise Will Follow-up  Obesity, Class I, BMI 30-34.9 Chronic and current BMI 32.93 Continue healthy diet and daily exercise Continue ozempic 0.25 mg weekly Will follow-up  Shortness of breath Chronic and most likely associated with deconditioning Continue healthy diet and activities as tolerated Will follow-up  Ankle swelling, unspecified laterality Chronic and stable Continue compression stockings and raising legs up  Need for immunization against influenza - Flu Vaccine Trivalent High Dose (Fluad)    No follow-ups on file.     The patient was advised to call back or seek an in-person evaluation if the symptoms worsen or if the condition fails to improve as anticipated.  I discussed the assessment and treatment plan with the patient. The patient was provided an opportunity to ask questions and all were answered. The patient agreed with the plan and demonstrated an understanding of the instructions.  I, [name], PA-C have reviewed all documentation for this visit. The documentation on  01/26/23  for the exam, diagnosis, procedures, and orders are all accurate and complete.  [name], [facility], MMS [facility] [phone] (phone) [phone] (fax)  [facility]

## 2023-01-31 ENCOUNTER — Other Ambulatory Visit: Payer: Self-pay

## 2023-01-31 ENCOUNTER — Telehealth: Payer: Self-pay | Admitting: Physician Assistant

## 2023-01-31 MED ORDER — ONETOUCH VERIO FLEX SYSTEM W/DEVICE KIT: 1.0000 | PACK | Freq: Every day | 0 refills | Status: DC

## 2023-01-31 NOTE — Telephone Encounter (Signed)
Requested Prescriptions  Pending Prescriptions Disp Refills   buPROPion ER (WELLBUTRIN SR) 100 MG 12 hr tablet [Pharmacy Med Name: BUPROPION SR 100MG  TABLETS (12 H)] 30 tablet 0    Sig: TAKE 1 TABLET(100 MG) BY MOUTH TWICE DAILY     Psychiatry: Antidepressants - bupropion Passed - 01/28/2023  4:04 PM      Passed - Cr in normal range and within 360 days    Creatinine  Date Value Ref Range Status  06/21/2013 0.54 (L) 0.60 - 1.30 mg/dL Final   Creatinine, Ser  Date Value Ref Range Status  01/18/2023 0.79 0.57 - 1.00 mg/dL Final         Passed - AST in normal range and within 360 days    AST  Date Value Ref Range Status  01/18/2023 18 0 - 40 IU/L Final         Passed - ALT in normal range and within 360 days    ALT  Date Value Ref Range Status  01/18/2023 12 0 - 32 IU/L Final         Passed - Last BP in normal range    BP Readings from Last 1 Encounters:  01/26/23 (!) 121/59         Passed - Valid encounter within last 6 months    Recent Outpatient Visits           5 days ago Type 2 diabetes mellitus without complication, without long-term current use of insulin (HCC)   [place] [facility] [place], [place], PA-C   1 month ago Hypertension associated with diabetes [facility])   [place] [facility] [place], [place], PA-C   3 months ago Adjustment disorder with mixed anxiety and depressed mood   [place] [facility] [place], [place], PA-C   4 months ago Hypertension associated with diabetes [facility])   [place] [facility] [place], [place], PA-C   5 months ago Hyperlipidemia associated with type 2 diabetes mellitus [facility])   [place] [facility] [place], [place], PA-C       Future Appointments             In 1 month [place], [place], PA-C [facility], [facility]

## 2023-01-31 NOTE — Telephone Encounter (Signed)
Pt is calling to report that the one touch has not be sent to [facility].   And requesting the buPROPion ER [facility] SR) 100 MG 12 hr tablet [[id number]] needs to be sent to  [id number] [name], [place] - [address] AT [facility] [address] Phone: [phone]  Fax: [phone]

## 2023-02-04 ENCOUNTER — Other Ambulatory Visit: Payer: Self-pay | Admitting: Physician Assistant

## 2023-02-04 ENCOUNTER — Other Ambulatory Visit: Payer: Self-pay

## 2023-02-04 ENCOUNTER — Telehealth: Payer: Self-pay | Admitting: Physician Assistant

## 2023-02-04 DIAGNOSIS — E119 Type 2 diabetes mellitus without complications: Secondary | ICD-10-CM

## 2023-02-04 DIAGNOSIS — I10 Essential (primary) hypertension: Secondary | ICD-10-CM

## 2023-02-04 MED ORDER — ONETOUCH DELICA LANCETS 30G MISC: 3 refills | Status: AC

## 2023-02-04 MED ORDER — GLUCOSE BLOOD VI STRP: ORAL_STRIP | 12 refills | Status: AC

## 2023-02-04 MED ORDER — ONETOUCH VERIO FLEX SYSTEM W/DEVICE KIT: 1.0000 | PACK | Freq: Every day | 0 refills | Status: AC

## 2023-02-04 NOTE — Telephone Encounter (Signed)
Sent    Patient advised

## 2023-02-04 NOTE — Telephone Encounter (Signed)
Patient is in need of one touch verio strips, not lancets or the glucometer.    A "kit" was sent in on 01/31/23 to [facility] but this needs to go to [facility] [address]   This needs to be corrected today.  She hasn't been able to check her BS in 2 weeks.

## 2023-02-04 NOTE — Telephone Encounter (Signed)
Requested Prescriptions  Pending Prescriptions Disp Refills   atenolol-chlorthalidone (TENORETIC) 50-25 MG tablet [Pharmacy Med Name: ATENOLOL/CHLORTHALIDONE 50/25 TABS] 90 tablet 0    Sig: TAKE 1 TABLET BY MOUTH DAILY     Cardiovascular: Beta Blocker + Diuretic Combos Passed - 02/04/2023  6:39 AM      Passed - K in normal range and within 180 days    Potassium  Date Value Ref Range Status  01/18/2023 3.5 3.5 - 5.2 mmol/L Final  06/21/2013 4.2 3.5 - 5.1 mmol/L Final         Passed - Na in normal range and within 180 days    Sodium  Date Value Ref Range Status  01/18/2023 142 134 - 144 mmol/L Final  06/21/2013 138 136 - 145 mmol/L Final         Passed - Cr in normal range and within 180 days    Creatinine  Date Value Ref Range Status  06/21/2013 0.54 (L) 0.60 - 1.30 mg/dL Final   Creatinine, Ser  Date Value Ref Range Status  01/18/2023 0.79 0.57 - 1.00 mg/dL Final         Passed - eGFR in normal range and within 180 days    EGFR (African American)  Date Value Ref Range Status  06/21/2013 >60  Final   GFR calc Af Amer  Date Value Ref Range Status  05/05/2020 99 >59 mL/min/1.73 Final    Comment:    **In accordance with recommendations from the NKF-ASN Task force,**   [facility] is in the process of updating its eGFR calculation to the   2021 CKD-EPI creatinine equation that estimates kidney function   without a race variable.    EGFR (Non-African Amer.)  Date Value Ref Range Status  06/21/2013 >60  Final    Comment:    eGFR values <61mL/min/1.73 m2 may be an indication of chronic kidney disease (CKD). Calculated eGFR is useful in patients with stable renal function. The eGFR calculation will not be reliable in acutely ill patients when serum creatinine is changing rapidly. It is not useful in  patients on dialysis. The eGFR calculation may not be applicable to patients at the low and high extremes of body sizes, pregnant women, and vegetarians. potassium - Slight  hemolysis, interpret results with  - caution.    GFR, Estimated  Date Value Ref Range Status  03/23/2022 >60 >60 mL/min Final    Comment:    (NOTE) Calculated using the CKD-EPI Creatinine Equation (2021)    eGFR  Date Value Ref Range Status  01/18/2023 76 >59 mL/min/1.73 Final         Passed - Last BP in normal range    BP Readings from Last 1 Encounters:  01/26/23 (!) 121/59         Passed - Last Heart Rate in normal range    Pulse Readings from Last 1 Encounters:  01/26/23 71         Passed - Valid encounter within last 6 months    Recent Outpatient Visits           1 week ago Type 2 diabetes mellitus without complication, without long-term current use of insulin (HCC)   [place] [facility] [place], [place], PA-C   1 month ago Hypertension associated with diabetes [facility])   [place] [facility] [place], [place], PA-C   3 months ago Adjustment disorder with mixed anxiety and depressed mood   [place] [facility] [place], [place], [place]  4 months ago Hypertension associated with diabetes [facility])   [place] [facility] [place], [place], PA-C   5 months ago Hyperlipidemia associated with type 2 diabetes mellitus [facility])   [place] [facility] [place], [place], PA-C       Future Appointments             In 3 weeks Ostwalt, [place], PA-C [facility], [place]

## 2023-02-10 DIAGNOSIS — H40153 Residual stage of open-angle glaucoma, bilateral: Secondary | ICD-10-CM | POA: Diagnosis not present

## 2023-02-10 DIAGNOSIS — H524 Presbyopia: Secondary | ICD-10-CM | POA: Diagnosis not present

## 2023-02-14 ENCOUNTER — Other Ambulatory Visit: Payer: Self-pay | Admitting: Physician Assistant

## 2023-02-14 DIAGNOSIS — I10 Essential (primary) hypertension: Secondary | ICD-10-CM

## 2023-02-16 DIAGNOSIS — M65341 Trigger finger, right ring finger: Secondary | ICD-10-CM | POA: Diagnosis not present

## 2023-02-16 DIAGNOSIS — M65312 Trigger thumb, left thumb: Secondary | ICD-10-CM | POA: Diagnosis not present

## 2023-02-16 DIAGNOSIS — M65311 Trigger thumb, right thumb: Secondary | ICD-10-CM | POA: Diagnosis not present

## 2023-02-16 DIAGNOSIS — M65342 Trigger finger, left ring finger: Secondary | ICD-10-CM | POA: Diagnosis not present

## 2023-02-20 ENCOUNTER — Other Ambulatory Visit: Payer: Self-pay | Admitting: Physician Assistant

## 2023-02-20 DIAGNOSIS — E119 Type 2 diabetes mellitus without complications: Secondary | ICD-10-CM

## 2023-02-20 DIAGNOSIS — F4323 Adjustment disorder with mixed anxiety and depressed mood: Secondary | ICD-10-CM

## 2023-02-23 DIAGNOSIS — M65311 Trigger thumb, right thumb: Secondary | ICD-10-CM | POA: Diagnosis not present

## 2023-02-23 DIAGNOSIS — M65312 Trigger thumb, left thumb: Secondary | ICD-10-CM | POA: Diagnosis not present

## 2023-02-27 NOTE — Progress Notes (Signed)
Established patient visit  Patient: Nicole Lynn   DOB: Apr 28, 1940   82 y.o. Female  MRN: [id number] Visit Date: 03/03/2023  Today's healthcare provider: [name], PA-C   Chief Complaint  Patient presents with   Follow-up    & flu vaccine    Subjective     Discussed the use of AI scribe software for clinical note transcription with the patient, who gave verbal consent to proceed.  History of Present Illness   The patient, with a history of diabetes and musculoskeletal pain, presents for a routine follow-up. She reports feeling well overall and is adhering to her prescribed medication regimen, which includes metformin for diabetes. She is also maintaining a healthy diet and hydration.  The patient has been experiencing a persistent pain in her neck, which she has been managing with regular visits to a chiropractor for the past two months. She reports some improvement, but the pain is still present. She also mentions a stiffness in her neck, which she is managing with heat therapy and massage.  The patient also reports occasional swelling in her ankle after periods of extended walking. She denies any associated shortness of breath unless she is exerting herself beyond her normal activity level.  Regarding her cholesterol management, the patient is reluctant to take additional medication, expressing a desire to limit her overall medication intake. She is currently taking a statin and Zestia for cholesterol management.  The patient is also taking medication for blood pressure control and reports adherence to this regimen. She expresses a strong desire to maintain her quality of life and is hesitant to add any additional treatments that may potentially decrease it.  The patient has recently started walking for exercise and plans to continue this activity. She is also considering adding physical therapy to her treatment plan for her neck pain.           03/03/2023    4:01 PM  10/20/2022    3:53 PM 09/22/2022    1:24 PM  Depression screen PHQ 2/9  Decreased Interest 0 0 3  Down, Depressed, Hopeless 0 0 3  PHQ - 2 Score 0 0 6  Altered sleeping   3  Tired, decreased energy   3  Change in appetite   0  Feeling bad or failure about yourself    0  Trouble concentrating   0  Moving slowly or fidgety/restless   0  Suicidal thoughts   0  PHQ-9 Score   12      09/22/2022    1:28 PM  GAD 7 : Generalized Anxiety Score  Nervous, Anxious, on Edge 0  Control/stop worrying 3  Worry too much - different things 3  Trouble relaxing 0  Restless 0  Easily annoyed or irritable 0  Afraid - awful might happen 3  Total GAD 7 Score 9    Medications: Outpatient Medications Prior to Visit  Medication Sig   atenolol-chlorthalidone (TENORETIC) 50-25 MG tablet TAKE 1 TABLET BY MOUTH DAILY   Blood Glucose Monitoring Suppl (ONETOUCH VERIO FLEX SYSTEM) w/Device KIT 1 each by Does not apply route daily at 2 PM.   buPROPion ER (WELLBUTRIN SR) 100 MG 12 hr tablet TAKE 1 TABLET(100 MG) BY MOUTH TWICE DAILY   glucose blood test strip Use as instructed   lisinopril (ZESTRIL) 5 MG tablet TAKE 1 TABLET(5 MG) BY MOUTH DAILY   metFORMIN (GLUCOPHAGE) 500 MG tablet TAKE 1 TABLET(500 MG) BY MOUTH DAILY WITH BREAKFAST   OneTouch Delica Lancets  30G MISC Use as directed   rosuvastatin (CRESTOR) 10 MG tablet Take 1 tablet (10 mg total) by mouth daily.   diazepam (VALIUM) 2 MG tablet Take 1 tablet (2 mg total) by mouth as directed. Take 1 tablet when directed by CT technologist.  May repeat after 15 minutes if needed for claustrophobia. (Patient not taking: Reported on 12/13/2022)   DULoxetine (CYMBALTA) 20 MG capsule Take 1 capsule (20 mg total) by mouth 2 (two) times daily. (Patient not taking: Reported on 01/26/2023)   ezetimibe (ZETIA) 10 MG tablet Take 1 tablet (10 mg total) by mouth daily. (Patient not taking: Reported on 01/26/2023)   latanoprost (XALATAN) 0.005 % ophthalmic solution Place 1  drop into both eyes at bedtime. (Patient not taking: Reported on 12/13/2022)   metFORMIN (GLUCOPHAGE) 1000 MG tablet Take 1 tablet (1,000 mg total) by mouth daily with breakfast. (Patient not taking: Reported on 01/26/2023)   metoprolol tartrate (LOPRESSOR) 50 MG tablet TAKE 1 TABLET 2 HR PRIOR TO CARDIAC PROCEDURE (Patient not taking: Reported on 01/26/2023)   Multiple Vitamins-Minerals (CENTRUM SILVER PO) Take by mouth once. (Patient not taking: Reported on 12/13/2022)   Semaglutide,0.25 or 0.5MG /DOS, (OZEMPIC, 0.25 OR 0.5 MG/DOSE,) 2 MG/3ML SOPN Inject 0.5 mg into the skin once a week. (Patient not taking: Reported on 01/26/2023)   zolpidem (AMBIEN) 10 MG tablet Take 1 tablet (10 mg total) by mouth at bedtime as needed for sleep. (Patient not taking: Reported on 01/26/2023)   No facility-administered medications prior to visit.    Review of Systems  All other systems reviewed and are negative.  Except see HPI       Objective    BP 119/68 (BP Location: Left Arm, Patient Position: Sitting, Cuff Size: Normal)   Pulse 71   Temp (!) 97.4 F (36.3 C)   Resp 16   Ht 5\' 1"  (1.549 m)   Wt 175 lb 9.6 oz (79.7 kg)   SpO2 95%   BMI 33.18 kg/m     Physical Exam Vitals reviewed.  Constitutional:      General: She is not in acute distress.    Appearance: Normal appearance. She is well-developed. She is not diaphoretic.  HENT:     Head: Normocephalic and atraumatic.  Eyes:     General: No scleral icterus.    Conjunctiva/sclera: Conjunctivae normal.  Neck:     Thyroid: No thyromegaly.  Cardiovascular:     Rate and Rhythm: Normal rate and regular rhythm.     Pulses: Normal pulses.     Heart sounds: Normal heart sounds. No murmur heard. Pulmonary:     Effort: Pulmonary effort is normal. No respiratory distress.     Breath sounds: Normal breath sounds. No wheezing, rhonchi or rales.  Musculoskeletal:     Cervical back: Neck supple.     Right lower leg: No edema.     Left lower leg: No  edema.  Lymphadenopathy:     Cervical: No cervical adenopathy.  Skin:    General: Skin is warm and dry.     Findings: No rash.  Neurological:     Mental Status: She is alert and oriented to person, place, and time. Mental status is at baseline.  Psychiatric:        Mood and Affect: Mood normal.        Behavior: Behavior normal.      No results found for any visits on 03/03/23.  Assessment & Plan    1. Type 2 diabetes mellitus without  complication, without long-term current use of insulin (HCC) chronic and stable Patient reports feeling well and is compliant with Metformin. No mention of hyperglycemic or hypoglycemic symptoms. -Continue Metformin. -Check renal function.  2. Obesity, Class I, BMI 30-34.9 Chronic and stable Advised taking ozempic but pt is not taking this medication Advised healthy diet and daily exercise as tolerated Will monitor  3. Hyperlipidemia associated with type 2 diabetes mellitus (HCC) Hyperlipidemia Patient is compliant with Zetia but refuses to take statins. -Continue Zetia. Questioning complicance  4. Shortness of breath Chronic Due to deconditioning Advised regular exercise as tolerated  Hypertension associated with diabetes (HCC) Hypertension Patient is compliant with antihypertensive medications. No symptoms of hypertensive urgency/emergency reported. -Continue current antihypertensive regimen.  Neck pain Musculoskeletal Pain Patient reports chronic neck stiffness and is currently seeing a chiropractor. No neurological symptoms reported. -Continue chiropractic treatment. -Consider physical therapy.  General Health Maintenance -Check urine for kidney function. -Encourage regular exercise, such as walking.  Return in about 5 weeks (around 04/07/2023) for chronic disease f/u.      The patient was advised to call back or seek an in-person evaluation if the symptoms worsen or if the condition fails to improve as anticipated.  I  discussed the assessment and treatment plan with the patient. The patient was provided an opportunity to ask questions and all were answered. The patient agreed with the plan and demonstrated an understanding of the instructions.  I, [name], PA-C have reviewed all documentation for this visit. The documentation on 03/03/2023 or the exam, diagnosis, procedures, and orders are all accurate and complete.  [name], [facility], MMS [facility] [phone] (phone) [phone] (fax)  [facility]

## 2023-02-28 DIAGNOSIS — M9901 Segmental and somatic dysfunction of cervical region: Secondary | ICD-10-CM | POA: Diagnosis not present

## 2023-02-28 DIAGNOSIS — R519 Headache, unspecified: Secondary | ICD-10-CM | POA: Diagnosis not present

## 2023-02-28 DIAGNOSIS — M542 Cervicalgia: Secondary | ICD-10-CM | POA: Diagnosis not present

## 2023-02-28 DIAGNOSIS — M9902 Segmental and somatic dysfunction of thoracic region: Secondary | ICD-10-CM | POA: Diagnosis not present

## 2023-03-03 ENCOUNTER — Ambulatory Visit: Payer: Medicare Other | Admitting: Physician Assistant

## 2023-03-03 VITALS — BP 119/68 | HR 71 | Temp 97.4°F | Resp 16 | Ht 61.0 in | Wt 175.6 lb

## 2023-03-03 DIAGNOSIS — R0602 Shortness of breath: Secondary | ICD-10-CM | POA: Diagnosis not present

## 2023-03-03 DIAGNOSIS — M542 Cervicalgia: Secondary | ICD-10-CM

## 2023-03-03 DIAGNOSIS — Z23 Encounter for immunization: Secondary | ICD-10-CM

## 2023-03-03 DIAGNOSIS — I152 Hypertension secondary to endocrine disorders: Secondary | ICD-10-CM

## 2023-03-03 DIAGNOSIS — Z7984 Long term (current) use of oral hypoglycemic drugs: Secondary | ICD-10-CM | POA: Diagnosis not present

## 2023-03-03 DIAGNOSIS — E1169 Type 2 diabetes mellitus with other specified complication: Secondary | ICD-10-CM

## 2023-03-03 DIAGNOSIS — M25473 Effusion, unspecified ankle: Secondary | ICD-10-CM

## 2023-03-03 DIAGNOSIS — E66811 Obesity, class 1: Secondary | ICD-10-CM

## 2023-03-03 DIAGNOSIS — E785 Hyperlipidemia, unspecified: Secondary | ICD-10-CM

## 2023-03-03 DIAGNOSIS — E119 Type 2 diabetes mellitus without complications: Secondary | ICD-10-CM

## 2023-03-03 DIAGNOSIS — E1159 Type 2 diabetes mellitus with other circulatory complications: Secondary | ICD-10-CM

## 2023-03-04 ENCOUNTER — Encounter: Payer: Self-pay | Admitting: Physician Assistant

## 2023-03-21 DIAGNOSIS — R519 Headache, unspecified: Secondary | ICD-10-CM | POA: Diagnosis not present

## 2023-03-21 DIAGNOSIS — M542 Cervicalgia: Secondary | ICD-10-CM | POA: Diagnosis not present

## 2023-03-21 DIAGNOSIS — M9901 Segmental and somatic dysfunction of cervical region: Secondary | ICD-10-CM | POA: Diagnosis not present

## 2023-03-21 DIAGNOSIS — M9902 Segmental and somatic dysfunction of thoracic region: Secondary | ICD-10-CM | POA: Diagnosis not present

## 2023-04-04 DIAGNOSIS — M542 Cervicalgia: Secondary | ICD-10-CM | POA: Diagnosis not present

## 2023-04-04 DIAGNOSIS — R519 Headache, unspecified: Secondary | ICD-10-CM | POA: Diagnosis not present

## 2023-04-04 DIAGNOSIS — M9901 Segmental and somatic dysfunction of cervical region: Secondary | ICD-10-CM | POA: Diagnosis not present

## 2023-04-04 DIAGNOSIS — M9902 Segmental and somatic dysfunction of thoracic region: Secondary | ICD-10-CM | POA: Diagnosis not present

## 2023-04-12 ENCOUNTER — Ambulatory Visit (INDEPENDENT_AMBULATORY_CARE_PROVIDER_SITE_OTHER): Payer: Medicare Other | Admitting: Physician Assistant

## 2023-04-12 ENCOUNTER — Encounter: Payer: Self-pay | Admitting: Physician Assistant

## 2023-04-12 VITALS — BP 124/61 | HR 74 | Temp 97.8°F | Resp 19 | Ht 61.0 in | Wt 174.8 lb

## 2023-04-12 DIAGNOSIS — M542 Cervicalgia: Secondary | ICD-10-CM | POA: Diagnosis not present

## 2023-04-12 DIAGNOSIS — Z7984 Long term (current) use of oral hypoglycemic drugs: Secondary | ICD-10-CM

## 2023-04-12 DIAGNOSIS — R5383 Other fatigue: Secondary | ICD-10-CM

## 2023-04-12 DIAGNOSIS — K219 Gastro-esophageal reflux disease without esophagitis: Secondary | ICD-10-CM

## 2023-04-12 DIAGNOSIS — E1169 Type 2 diabetes mellitus with other specified complication: Secondary | ICD-10-CM

## 2023-04-12 DIAGNOSIS — R202 Paresthesia of skin: Secondary | ICD-10-CM | POA: Diagnosis not present

## 2023-04-12 DIAGNOSIS — I152 Hypertension secondary to endocrine disorders: Secondary | ICD-10-CM

## 2023-04-12 DIAGNOSIS — E1159 Type 2 diabetes mellitus with other circulatory complications: Secondary | ICD-10-CM

## 2023-04-12 DIAGNOSIS — E785 Hyperlipidemia, unspecified: Secondary | ICD-10-CM

## 2023-04-12 NOTE — Progress Notes (Unsigned)
Established patient visit  Patient: Nicole Lynn   DOB: 1941-03-10   82 y.o. Female  MRN: [id number] Visit Date: 04/12/2023  Today's healthcare provider: [name], PA-C   Chief Complaint  Patient presents with   Medical Management of Chronic Issues   Subjective       Discussed the use of AI scribe software for clinical note transcription with the patient, who gave verbal consent to proceed.  History of Present Illness   The patient presents with persistent neck pain, which is exacerbated by physical activity such as using a leaf blower. They have been attending chiropractic sessions, but report minimal improvement. They have tried managing the pain with ibuprofen, but it only provides temporary relief. The patient is active and dislikes being idle, which often leads to overexertion and subsequent pain. They have previously experienced relief from acupuncture and are open to trying it again. They also express interest in massage therapy, but have concerns about privacy and dignity during the process. .      They report feeling fatigued and slowed down, which they attribute to their age. They also experience occasional numbness and tingling in their arm, particularly when they lay on it. They deny any weakness or inability to open things.  The patient also reports occasional positional dizziness, which they believe may be due to changes in blood pressure. They have noticed that they need to take breaks during physical activity, such as walking or yard work, to catch their breath. They deny any chest pain or shortness of breath at rest.  They also report a scratchy throat and a sour stomach, which they describe as indigestion. They deny any fever or pain. They have been managing their symptoms with home remedies such as warm salt gargles and potato breathing.  The patient is currently taking lisinopril, metformin, and Tenoretic. They have stopped taking their cholesterol  medication and deny taking any other medications. They express a desire to avoid further weight loss and are not interested in taking Wellbutrin for energy or weight loss.      03/03/2023    4:01 PM 10/20/2022    3:53 PM 09/22/2022    1:24 PM  Depression screen PHQ 2/9  Decreased Interest 0 0 3  Down, Depressed, Hopeless 0 0 3  PHQ - 2 Score 0 0 6  Altered sleeping   3  Tired, decreased energy   3  Change in appetite   0  Feeling bad or failure about yourself    0  Trouble concentrating   0  Moving slowly or fidgety/restless   0  Suicidal thoughts   0  PHQ-9 Score   12      09/22/2022    1:28 PM  GAD 7 : Generalized Anxiety Score  Nervous, Anxious, on Edge 0  Control/stop worrying 3  Worry too much - different things 3  Trouble relaxing 0  Restless 0  Easily annoyed or irritable 0  Afraid - awful might happen 3  Total GAD 7 Score 9    Medications: Outpatient Medications Prior to Visit  Medication Sig   atenolol-chlorthalidone (TENORETIC) 50-25 MG tablet TAKE 1 TABLET BY MOUTH DAILY   Blood Glucose Monitoring Suppl (ONETOUCH VERIO FLEX SYSTEM) w/Device KIT 1 each by Does not apply route daily at 2 PM.   buPROPion ER (WELLBUTRIN SR) 100 MG 12 hr tablet TAKE 1 TABLET(100 MG) BY MOUTH TWICE DAILY   glucose blood test strip Use as instructed   lisinopril (ZESTRIL) 5  MG tablet TAKE 1 TABLET(5 MG) BY MOUTH DAILY   metFORMIN (GLUCOPHAGE) 500 MG tablet TAKE 1 TABLET(500 MG) BY MOUTH DAILY WITH BREAKFAST   Multiple Vitamins-Minerals (CENTRUM SILVER PO) Take by mouth once. (Patient not taking: Reported on 12/13/2022)   OneTouch Delica Lancets 30G MISC Use as directed   Semaglutide,0.25 or 0.5MG /DOS, (OZEMPIC, 0.25 OR 0.5 MG/DOSE,) 2 MG/3ML SOPN Inject 0.5 mg into the skin once a week. (Patient not taking: Reported on 01/26/2023)   [DISCONTINUED] diazepam (VALIUM) 2 MG tablet Take 1 tablet (2 mg total) by mouth as directed. Take 1 tablet when directed by CT technologist.  May repeat  after 15 minutes if needed for claustrophobia. (Patient not taking: Reported on 12/13/2022)   [DISCONTINUED] DULoxetine (CYMBALTA) 20 MG capsule Take 1 capsule (20 mg total) by mouth 2 (two) times daily. (Patient not taking: Reported on 01/26/2023)   [DISCONTINUED] ezetimibe (ZETIA) 10 MG tablet Take 1 tablet (10 mg total) by mouth daily. (Patient not taking: Reported on 01/26/2023)   [DISCONTINUED] latanoprost (XALATAN) 0.005 % ophthalmic solution Place 1 drop into both eyes at bedtime. (Patient not taking: Reported on 12/13/2022)   [DISCONTINUED] metFORMIN (GLUCOPHAGE) 1000 MG tablet Take 1 tablet (1,000 mg total) by mouth daily with breakfast. (Patient not taking: Reported on 01/26/2023)   [DISCONTINUED] metoprolol tartrate (LOPRESSOR) 50 MG tablet TAKE 1 TABLET 2 HR PRIOR TO CARDIAC PROCEDURE (Patient not taking: Reported on 01/26/2023)   [DISCONTINUED] rosuvastatin (CRESTOR) 10 MG tablet Take 1 tablet (10 mg total) by mouth daily.   [DISCONTINUED] zolpidem (AMBIEN) 10 MG tablet Take 1 tablet (10 mg total) by mouth at bedtime as needed for sleep. (Patient not taking: Reported on 01/26/2023)   No facility-administered medications prior to visit.    Review of Systems All negative Except see HPI       Objective    BP 124/61   Pulse 74   Temp 97.8 F (36.6 C)   Resp 19   Ht 5\' 1"  (1.549 m)   Wt 174 lb 12.8 oz (79.3 kg)   SpO2 95%   BMI 33.03 kg/m     Physical Exam Vitals reviewed.  Constitutional:      General: She is not in acute distress.    Appearance: Normal appearance. She is well-developed. She is obese. She is not diaphoretic.  HENT:     Head: Normocephalic and atraumatic.  Eyes:     General: No scleral icterus.    Conjunctiva/sclera: Conjunctivae normal.  Neck:     Thyroid: No thyromegaly.  Cardiovascular:     Rate and Rhythm: Normal rate and regular rhythm.     Pulses: Normal pulses.     Heart sounds: Normal heart sounds. No murmur heard. Pulmonary:     Effort:  Pulmonary effort is normal. No respiratory distress.     Breath sounds: Normal breath sounds. No wheezing, rhonchi or rales.  Musculoskeletal:     Cervical back: Neck supple.     Right lower leg: No edema.     Left lower leg: No edema.  Lymphadenopathy:     Cervical: No cervical adenopathy.  Skin:    General: Skin is warm and dry.     Findings: No rash.  Neurological:     Mental Status: She is alert and oriented to person, place, and time. Mental status is at baseline.  Psychiatric:        Mood and Affect: Mood normal.        Behavior: Behavior normal.  No results found for any visits on 04/12/23.      Assessment and Plan    Neck Pain Chronic neck pain exacerbated by physical activity. Current management with chiropractic care and ibuprofen provides minimal relief. Discussed options of physical therapy, massage, acupuncture, and medication. Patient expressed interest in acupuncture and steroid shot. -Refer to acupuncture clinic in [facility] for treatment. -Provide a few tablets of oral steroids for use if absolutely necessary. -Consider a muscle relaxant such as Flexeril if pain persists. -Consider a steroid shot, with caution due to potential for increased blood sugar.     Orthostatic Hypotension suspected Experiencing dizziness upon standing. Discussed the possibility of positional changes causing blood pressure fluctuations. -Advised to be cautious during positional changes. Advised proper hydration and healthy diet.  Will follow-up if symptoms persist  Paresthesia Reports of arm falling asleep at night and occasional tingling in fingers, particularly when lying on it. -Continue to monitor symptoms. Advised BS control, weight loss and referral to neurology if pt agrees  Fatigue Could be related with activities and age-related Reports of feeling slowed down and less energetic. Discussed the potential benefits of Wellbutrin for mood elevation and weight  loss. -Consider starting Wellbutrin for mood elevation and weight loss, pending patient's decision.  Gastroesophageal Reflux Disease (GERD) chronic Reports of sour stomach and indigestion. Discussed lifestyle modifications to manage symptoms. -Advised to avoid eating 3-4 hours before bed, avoid tight clothing around the waist, and eat until 80% full.  Hypertension Chronic and stable Currently managed on Lisinopril 5 mg,  and Tenoretic 50-25 -Continue current medications. Encouraged low salt diet and daily exercise  DMII Chronic, last A1c 7.2 Currently on Metformin 500 mg Encouraged low carb diet and regular exercise  Hyperlipidemia Chronic Patient declined cholesterol medication. -No changes to current plan. Needs labs, pt prefers to proceed  General Health Maintenance / Followup Plans -Plan to conduct blood work in mid-February 2025. -Schedule follow-up appointment in mid-February 2025. No orders of the defined types were placed in this encounter.   Return in about 2 months (around 06/13/2023) for chronic disease f/u labs.   The patient was advised to call back or seek an in-person evaluation if the symptoms worsen or if the condition fails to improve as anticipated.  I discussed the assessment and treatment plan with the patient. The patient was provided an opportunity to ask questions and all were answered. The patient agreed with the plan and demonstrated an understanding of the instructions.  I, [name], PA-C have reviewed all documentation for this visit. The documentation on 04/12/2023  for the exam, diagnosis, procedures, and orders are all accurate and complete.  [name], [facility], MMS [facility] [phone] (phone) [phone] (fax)  [facility]

## 2023-05-01 ENCOUNTER — Other Ambulatory Visit: Payer: Self-pay | Admitting: Physician Assistant

## 2023-05-01 DIAGNOSIS — I10 Essential (primary) hypertension: Secondary | ICD-10-CM

## 2023-05-18 ENCOUNTER — Other Ambulatory Visit: Payer: Self-pay | Admitting: Physician Assistant

## 2023-05-18 DIAGNOSIS — E1169 Type 2 diabetes mellitus with other specified complication: Secondary | ICD-10-CM

## 2023-05-27 ENCOUNTER — Encounter: Payer: Self-pay | Admitting: Physician Assistant

## 2023-05-27 ENCOUNTER — Ambulatory Visit (INDEPENDENT_AMBULATORY_CARE_PROVIDER_SITE_OTHER): Payer: Medicare Other | Admitting: Physician Assistant

## 2023-05-27 VITALS — BP 114/70 | HR 79 | Ht 61.0 in | Wt 176.0 lb

## 2023-05-27 DIAGNOSIS — E038 Other specified hypothyroidism: Secondary | ICD-10-CM

## 2023-05-27 DIAGNOSIS — M542 Cervicalgia: Secondary | ICD-10-CM | POA: Diagnosis not present

## 2023-05-27 DIAGNOSIS — E1169 Type 2 diabetes mellitus with other specified complication: Secondary | ICD-10-CM

## 2023-05-27 DIAGNOSIS — E1159 Type 2 diabetes mellitus with other circulatory complications: Secondary | ICD-10-CM

## 2023-05-27 DIAGNOSIS — Z7984 Long term (current) use of oral hypoglycemic drugs: Secondary | ICD-10-CM

## 2023-05-27 DIAGNOSIS — I152 Hypertension secondary to endocrine disorders: Secondary | ICD-10-CM | POA: Diagnosis not present

## 2023-05-27 DIAGNOSIS — E785 Hyperlipidemia, unspecified: Secondary | ICD-10-CM

## 2023-05-27 MED ORDER — CYCLOBENZAPRINE HCL 5 MG PO TABS: 5.0000 mg | ORAL_TABLET | Freq: Three times a day (TID) | ORAL | 1 refills | Status: DC | PRN

## 2023-05-27 NOTE — Progress Notes (Signed)
Established patient visit  Patient: Nicole Lynn   DOB: 01/06/1941   83 y.o. Female  MRN: [id number] Visit Date: 05/27/2023  Today's healthcare provider: [name], PA-C   Chief Complaint  Patient presents with   Follow-up    Follow-up 5 weeks/lab s   Subjective       Discussed the use of AI scribe software for clinical note transcription with the patient, who gave verbal consent to proceed.  History of Present Illness   The patient, with a history of diabetes and hyperlipidemia, presents with neck pain. They have been seeing an acupuncturist, Dr. Lester Kinsman, for the past four sessions and report feeling better overall. They note that they can now get out of bed without having to straighten their body in a different direction. However, during the last session, the acupuncturist hit a nerve which caused significant discomfort. They have been using Tylenol for pain management and have not been attending physical therapy. They also mention using a sports Theme park manager for relief.     Doing well with BP, continue taking all medications for HTN, HLD, DMII. Last labwork was done in 12/2022.      03/03/2023    4:01 PM 10/20/2022    3:53 PM 09/22/2022    1:24 PM  Depression screen PHQ 2/9  Decreased Interest 0 0 3  Down, Depressed, Hopeless 0 0 3  PHQ - 2 Score 0 0 6  Altered sleeping   3  Tired, decreased energy   3  Change in appetite   0  Feeling bad or failure about yourself    0  Trouble concentrating   0  Moving slowly or fidgety/restless   0  Suicidal thoughts   0  PHQ-9 Score   12      09/22/2022    1:28 PM  GAD 7 : Generalized Anxiety Score  Nervous, Anxious, on Edge 0  Control/stop worrying 3  Worry too much - different things 3  Trouble relaxing 0  Restless 0  Easily annoyed or irritable 0  Afraid - awful might happen 3  Total GAD 7 Score 9    Medications: Outpatient Medications Prior to Visit  Medication Sig   atenolol-chlorthalidone (TENORETIC)  50-25 MG tablet TAKE 1 TABLET BY MOUTH DAILY   Blood Glucose Monitoring Suppl (ONETOUCH VERIO FLEX SYSTEM) w/Device KIT 1 each by Does not apply route daily at 2 PM.   buPROPion ER (WELLBUTRIN SR) 100 MG 12 hr tablet TAKE 1 TABLET(100 MG) BY MOUTH TWICE DAILY   glucose blood test strip Use as instructed   lisinopril (ZESTRIL) 5 MG tablet TAKE 1 TABLET(5 MG) BY MOUTH DAILY   metFORMIN (GLUCOPHAGE) 500 MG tablet TAKE 1 TABLET(500 MG) BY MOUTH DAILY WITH BREAKFAST   Multiple Vitamins-Minerals (CENTRUM SILVER PO) Take by mouth once.   OneTouch Delica Lancets 30G MISC Use as directed   rosuvastatin (CRESTOR) 10 MG tablet TAKE 1 TABLET(10 MG) BY MOUTH DAILY   Semaglutide,0.25 or 0.5MG /DOS, (OZEMPIC, 0.25 OR 0.5 MG/DOSE,) 2 MG/3ML SOPN Inject 0.5 mg into the skin once a week.   No facility-administered medications prior to visit.    Review of Systems All negative Except see HPI       Objective    BP 114/70 (BP Location: Right Arm, Patient Position: Sitting, Cuff Size: Large)   Pulse 79   Ht 5\' 1"  (1.549 m)   Wt 176 lb (79.8 kg)   SpO2 97%   BMI 33.25 kg/m  Physical Exam Vitals reviewed.  Constitutional:      General: She is not in acute distress.    Appearance: Normal appearance. She is well-developed. She is obese. She is not diaphoretic.  HENT:     Head: Normocephalic and atraumatic.  Eyes:     General: No scleral icterus.    Conjunctiva/sclera: Conjunctivae normal.  Neck:     Thyroid: No thyromegaly.  Cardiovascular:     Rate and Rhythm: Normal rate and regular rhythm.     Pulses: Normal pulses.     Heart sounds: Normal heart sounds. No murmur heard. Pulmonary:     Effort: Pulmonary effort is normal. No respiratory distress.     Breath sounds: Normal breath sounds. No wheezing, rhonchi or rales.  Musculoskeletal:     Cervical back: Neck supple.     Right lower leg: No edema.     Left lower leg: No edema.  Lymphadenopathy:     Cervical: No cervical adenopathy.   Skin:    General: Skin is warm and dry.     Findings: No rash.  Neurological:     Mental Status: She is alert and oriented to person, place, and time. Mental status is at baseline.  Psychiatric:        Mood and Affect: Mood normal.        Behavior: Behavior normal.      No results found for any visits on 05/27/23.      Assessment and Plan    Chronic Pain Patient has been receiving acupuncture treatments for pain management, with some improvement noted. However, there was an incident of nerve irritation during the last session. Patient is currently taking Tylenol for pain management. -Consider prescribing a muscle relaxant for nighttime use to aid in sleep and pain management. -If muscle relaxant is not effective, consider prescribing gabapentin for nerve pain.  General Health Maintenance Last blood work was done in September. -Order new blood work after an 8-hour fast to assess current health status.  Diabetes Patient is currently taking Metformin 500mg  for diabetes management. -Continue current medication regimen. Will need to check labs  Hyperlipidemia Patient is currently on medication for cholesterol management. -Continue current medication regimen/rosuvastatin 10mg  and lifestyle modifications Ordered LP Will follow-up     Hypertension Chronic and stable Continue current regimen: tenoretic 50-25 and lisinopril 5 Ordered CMP Will follow-up   Orders Placed This Encounter  Procedures   Lipid panel    Has the patient fasted?:   Yes   Hemoglobin A1c   Comprehensive metabolic panel    Has the patient fasted?:   Yes   TSH    Return in about 6 weeks (around 07/08/2023) for chronic disease f/u.   The patient was advised to call back or seek an in-person evaluation if the symptoms worsen or if the condition fails to improve as anticipated.  I discussed the assessment and treatment plan with the patient. The patient was provided an opportunity to ask questions and  all were answered. The patient agreed with the plan and demonstrated an understanding of the instructions.  I, [name], PA-C have reviewed all documentation for this visit. The documentation on 05/27/2023  for the exam, diagnosis, procedures, and orders are all accurate and complete.  [name], [facility], MMS [facility] [phone] (phone) [phone] (fax)  [facility]

## 2023-05-30 ENCOUNTER — Other Ambulatory Visit: Payer: Self-pay | Admitting: Physician Assistant

## 2023-05-30 ENCOUNTER — Ambulatory Visit: Payer: Medicare Other | Admitting: Physician Assistant

## 2023-05-30 DIAGNOSIS — E119 Type 2 diabetes mellitus without complications: Secondary | ICD-10-CM

## 2023-06-21 DIAGNOSIS — H40153 Residual stage of open-angle glaucoma, bilateral: Secondary | ICD-10-CM | POA: Diagnosis not present

## 2023-07-07 NOTE — Progress Notes (Signed)
 Established patient visit  Patient: Nicole Lynn   DOB: 11-29-1940   83 y.o. Female  MRN: [id number] Visit Date: 07/08/2023  Today's healthcare provider: [name], PA-C   Chief Complaint  Patient presents with   Follow-up    6 wk f/u no conerns   Subjective      Discussed the use of AI scribe software for clinical note transcription with the patient, who gave verbal consent to proceed.  History of Present Illness      Diabetes Mellitus Type II, Follow-up  Lab Results  Component Value Date   HGBA1C 7.2 (H) 01/18/2023   HGBA1C 6.5 (H) 07/01/2022   HGBA1C 7.0 (H) 02/15/2022   Wt Readings from Last 3 Encounters:  07/08/23 178 lb 3.2 oz (80.8 kg)  05/27/23 176 lb (79.8 kg)  04/12/23 174 lb 12.8 oz (79.3 kg)   Last seen for diabetes 5 weeks ago.  Management since then includes metformin 500mg /. She reports fair compliance with treatment. She is not having side effects.  Symptoms:  No foot ulcerations   No nausea  No paresthesia of the feet  No polydipsia  No polyuria No visual disturbances   No vomiting     Home blood sugar records:  does not measure  Episodes of hypoglycemia? No   Does not measure  Current exercise: gardening   Pertinent Labs: Lab Results  Component Value Date   CHOL 161 01/18/2023   HDL 54 01/18/2023   LDLCALC 81 01/18/2023   TRIG 151 (H) 01/18/2023   CHOLHDL 3.0 01/18/2023   Lab Results  Component Value Date   NA 142 01/18/2023   K 3.5 01/18/2023   CREATININE 0.79 01/18/2023   EGFR 76 01/18/2023   MICRALBCREAT 4 10/29/2021    Hypertension, follow-up  BP Readings from Last 3 Encounters:  07/08/23 (!) 147/74  05/27/23 114/70  04/12/23 124/61   Wt Readings from Last 3 Encounters:  07/08/23 178 lb 3.2 oz (80.8 kg)  05/27/23 176 lb (79.8 kg)  04/12/23 174 lb 12.8 oz (79.3 kg)     She was last seen for hypertension 5 weeks ago.  BP at that visit was see above. Management since that visit includes tenoretic 50-25  and lisinopril 5.  She reports fair compliance with treatment. She is not having side effects.   She does not smoke.   Symptoms: No chest pain No chest pressure  No palpitations No syncope  No dyspnea No orthopnea  No paroxysmal nocturnal dyspnea No lower extremity edema   Pertinent labs Lab Results  Component Value Date   CHOL 161 01/18/2023   HDL 54 01/18/2023   LDLCALC 81 01/18/2023   TRIG 151 (H) 01/18/2023   CHOLHDL 3.0 01/18/2023   Lab Results  Component Value Date   NA 142 01/18/2023   K 3.5 01/18/2023   CREATININE 0.79 01/18/2023   EGFR 76 01/18/2023   GLUCOSE 150 (H) 01/18/2023   TSH 2.520 02/15/2022     The ASCVD Risk score (Arnett DK, et al., 2019) failed to calculate for the following reasons:   The 2019 ASCVD risk score is only valid for ages 61 to 83  Lipid/Cholesterol, Follow-up  Last lipid panel Other pertinent labs  Lab Results  Component Value Date   CHOL 161 01/18/2023   HDL 54 01/18/2023   LDLCALC 81 01/18/2023   TRIG 151 (H) 01/18/2023   CHOLHDL 3.0 01/18/2023   Lab Results  Component Value Date   ALT 12 01/18/2023   AST 18  01/18/2023   PLT 234 01/18/2023   TSH 2.520 02/15/2022     She was last seen for this 5 weeks ago.  Management since that visit includes rosuvastatin.  She reports fair compliance with treatment. She is not having side effects.   Symptoms: No chest pain No chest pressure/discomfort  No dyspnea No lower extremity edema  No numbness or tingling of extremity No orthopnea  No palpitations No paroxysmal nocturnal dyspnea  No speech difficulty No syncope   Current diet: in general, an "unhealthy" diet Current exercise: gardening  The ASCVD Risk score (Arnett DK, et al., 2019) failed to calculate for the following reasons:   The 2019 ASCVD risk score is only valid for ages 27 to 25  ---------------------------------------------------------------------------------------------------      03/03/2023    4:01 PM  10/20/2022    3:53 PM 09/22/2022    1:24 PM  Depression screen PHQ 2/9  Decreased Interest 0 0 3  Down, Depressed, Hopeless 0 0 3  PHQ - 2 Score 0 0 6  Altered sleeping   3  Tired, decreased energy   3  Change in appetite   0  Feeling bad or failure about yourself    0  Trouble concentrating   0  Moving slowly or fidgety/restless   0  Suicidal thoughts   0  PHQ-9 Score   12      09/22/2022    1:28 PM  GAD 7 : Generalized Anxiety Score  Nervous, Anxious, on Edge 0  Control/stop worrying 3  Worry too much - different things 3  Trouble relaxing 0  Restless 0  Easily annoyed or irritable 0  Afraid - awful might happen 3  Total GAD 7 Score 9    Medications: Outpatient Medications Prior to Visit  Medication Sig   atenolol-chlorthalidone (TENORETIC) 50-25 MG tablet TAKE 1 TABLET BY MOUTH DAILY   Blood Glucose Monitoring Suppl (ONETOUCH VERIO FLEX SYSTEM) w/Device KIT 1 each by Does not apply route daily at 2 PM.   buPROPion ER (WELLBUTRIN SR) 100 MG 12 hr tablet TAKE 1 TABLET(100 MG) BY MOUTH TWICE DAILY   cyclobenzaprine (FLEXERIL) 5 MG tablet Take 1 tablet (5 mg total) by mouth 3 (three) times daily as needed for muscle spasms.   glucose blood test strip Use as instructed   lisinopril (ZESTRIL) 5 MG tablet TAKE 1 TABLET(5 MG) BY MOUTH DAILY   metFORMIN (GLUCOPHAGE) 500 MG tablet TAKE 1 TABLET(500 MG) BY MOUTH DAILY WITH BREAKFAST   Multiple Vitamins-Minerals (CENTRUM SILVER PO) Take by mouth once.   OneTouch Delica Lancets 30G MISC Use as directed   rosuvastatin (CRESTOR) 10 MG tablet TAKE 1 TABLET(10 MG) BY MOUTH DAILY   Semaglutide,0.25 or 0.5MG /DOS, (OZEMPIC, 0.25 OR 0.5 MG/DOSE,) 2 MG/3ML SOPN Inject 0.5 mg into the skin once a week.   No facility-administered medications prior to visit.    Review of Systems  All other systems reviewed and are negative.  All negative Except see HPI       Objective    BP (!) 147/74 (BP Location: Right Arm, Patient Position:  Sitting, Cuff Size: Normal)   Pulse 78   Temp 98.1 F (36.7 C) (Oral)   Resp 16   Ht 5\' 1"  (1.549 m)   Wt 178 lb 3.2 oz (80.8 kg)   SpO2 98%   BMI 33.67 kg/m     Physical Exam Vitals reviewed.  Constitutional:      General: She is not in acute distress.  Appearance: Normal appearance. She is well-developed. She is not diaphoretic.  HENT:     Head: Normocephalic and atraumatic.  Eyes:     General: No scleral icterus.    Conjunctiva/sclera: Conjunctivae normal.  Neck:     Thyroid: No thyromegaly.  Cardiovascular:     Rate and Rhythm: Normal rate and regular rhythm.     Pulses: Normal pulses.     Heart sounds: Normal heart sounds. No murmur heard. Pulmonary:     Effort: Pulmonary effort is normal. No respiratory distress.     Breath sounds: Normal breath sounds. No wheezing, rhonchi or rales.  Musculoskeletal:     Cervical back: Neck supple.     Right lower leg: No edema.     Left lower leg: No edema.  Lymphadenopathy:     Cervical: No cervical adenopathy.  Skin:    General: Skin is warm and dry.     Findings: No rash.  Neurological:     Mental Status: She is alert and oriented to person, place, and time. Mental status is at baseline.  Psychiatric:        Mood and Affect: Mood normal.        Behavior: Behavior normal.      No results found for any visits on 07/08/23.      Assessment and Plan 1. Type 2 diabetes mellitus with other specified complication, without long-term current use of insulin (HCC) (Primary) Chronic A1c was 7.2  on 12/2022, should be less than 7 Gfr 76 Continue taking metformin 500 mg  Continue low carb diet and regular exercise Will fu  2. Hyperlipidemia associated with type 2 diabetes mellitus (HCC) Chronic Ldl was 81 on 12/2022, should be less than 70 Continue taking rosuvastatin 10. Continue low cholesterol diet and exercise Will follow-up  3. Hypertension associated with diabetes (HCC) Chronic and unstable Advised to take  tenoretic 50-25 and lisinopril should be increased to 10mg ? Continue low salt diet and regular exercise Will follow-up  4. Subclinical hypothyroidism chronic Asymptomatic, needs TSH Will follow-up  Neck pain Chronic Improved on acupuncture Will consider PT and after her trip from [place] in April Consider orthopedics/pain management after her trip Will fu  Labs were provided from 05/27/23. Advised that labs should be done after 8 hours of fasting before the next follow-up \ No orders of the defined types were placed in this encounter.   Return in about 6 weeks (around 08/19/2023) for chronic disease f/u.   The patient was advised to call back or seek an in-person evaluation if the symptoms worsen or if the condition fails to improve as anticipated.  I discussed the assessment and treatment plan with the patient. The patient was provided an opportunity to ask questions and all were answered. The patient agreed with the plan and demonstrated an understanding of the instructions.  I, [name], PA-C have reviewed all documentation for this visit. The documentation on 07/08/2023  for the exam, diagnosis, procedures, and orders are all accurate and complete.  [name], [facility], MMS [facility] [phone] (phone) [phone] (fax)  [facility]

## 2023-07-08 ENCOUNTER — Ambulatory Visit: Payer: Medicare Other | Admitting: Physician Assistant

## 2023-07-08 ENCOUNTER — Encounter: Payer: Self-pay | Admitting: Physician Assistant

## 2023-07-08 VITALS — BP 147/74 | HR 78 | Temp 98.1°F | Resp 16 | Ht 61.0 in | Wt 178.2 lb

## 2023-07-08 DIAGNOSIS — M542 Cervicalgia: Secondary | ICD-10-CM

## 2023-07-08 DIAGNOSIS — E038 Other specified hypothyroidism: Secondary | ICD-10-CM

## 2023-07-08 DIAGNOSIS — E1169 Type 2 diabetes mellitus with other specified complication: Secondary | ICD-10-CM | POA: Diagnosis not present

## 2023-07-08 DIAGNOSIS — E785 Hyperlipidemia, unspecified: Secondary | ICD-10-CM

## 2023-07-08 DIAGNOSIS — E1159 Type 2 diabetes mellitus with other circulatory complications: Secondary | ICD-10-CM

## 2023-07-08 DIAGNOSIS — R5383 Other fatigue: Secondary | ICD-10-CM

## 2023-07-08 DIAGNOSIS — I152 Hypertension secondary to endocrine disorders: Secondary | ICD-10-CM

## 2023-07-25 ENCOUNTER — Other Ambulatory Visit: Payer: Self-pay | Admitting: Physician Assistant

## 2023-07-25 DIAGNOSIS — I10 Essential (primary) hypertension: Secondary | ICD-10-CM

## 2023-07-25 NOTE — Telephone Encounter (Unsigned)
 Copied from CRM [id number]. Topic: Clinical - Medication Refill >> Jul 25, 2023 11:32 AM Nicole Lynn wrote: Most Recent Primary Care Visit:  Provider: [name]  Department: [facility]  Visit Type: OFFICE VISIT  Date: 07/08/2023  Medication: atenolol-chlorthalidone (TENORETIC) 50-25 MG table  Has the patient contacted their pharmacy? Yes (Agent: If no, request that the patient contact the pharmacy for the refill. If patient does not wish to contact the pharmacy document the reason why and proceed with request.) (Agent: If yes, when and what did the pharmacy advise?)  Is this the correct pharmacy for this prescription? Yes If no, delete pharmacy and type the correct one.  This is the patient's preferred pharmacy:  [id number] - [name], [place] - [address] AT [facility] [address] [place] [address] Phone: [phone] Fax: [phone]   Has the prescription been filled recently? Yes  Is the patient out of the medication? Yes  Has the patient been seen for an appointment in the last year OR does the patient have an upcoming appointment? Yes  Can we respond through MyChart? No  Agent: Please be advised that Rx refills may take up to 3 business days. We ask that you follow-up with your pharmacy.

## 2023-07-27 MED ORDER — ATENOLOL-CHLORTHALIDONE 50-25 MG PO TABS: 1.0000 | ORAL_TABLET | Freq: Every day | ORAL | 0 refills | Status: DC

## 2023-07-27 NOTE — Telephone Encounter (Signed)
 Requested Prescriptions  Pending Prescriptions Disp Refills   atenolol-chlorthalidone (TENORETIC) 50-25 MG tablet 90 tablet 0    Sig: Take 1 tablet by mouth daily.     Cardiovascular: Beta Blocker + Diuretic Combos Failed - 07/27/2023 11:27 AM      Failed - K in normal range and within 180 days    Potassium  Date Value Ref Range Status  01/18/2023 3.5 3.5 - 5.2 mmol/L Final  06/21/2013 4.2 3.5 - 5.1 mmol/L Final         Failed - Na in normal range and within 180 days    Sodium  Date Value Ref Range Status  01/18/2023 142 134 - 144 mmol/L Final  06/21/2013 138 136 - 145 mmol/L Final         Failed - Cr in normal range and within 180 days    Creatinine  Date Value Ref Range Status  06/21/2013 0.54 (L) 0.60 - 1.30 mg/dL Final   Creatinine, Ser  Date Value Ref Range Status  01/18/2023 0.79 0.57 - 1.00 mg/dL Final         Failed - eGFR in normal range and within 180 days    EGFR (African American)  Date Value Ref Range Status  06/21/2013 >60  Final   GFR calc Af Amer  Date Value Ref Range Status  05/05/2020 99 >59 mL/min/1.73 Final    Comment:    **In accordance with recommendations from the NKF-ASN Task force,**   [facility] is in the process of updating its eGFR calculation to the   2021 CKD-EPI creatinine equation that estimates kidney function   without a race variable.    EGFR (Non-African Amer.)  Date Value Ref Range Status  06/21/2013 >60  Final    Comment:    eGFR values <17mL/min/1.73 m2 may be an indication of chronic kidney disease (CKD). Calculated eGFR is useful in patients with stable renal function. The eGFR calculation will not be reliable in acutely ill patients when serum creatinine is changing rapidly. It is not useful in  patients on dialysis. The eGFR calculation may not be applicable to patients at the low and high extremes of body sizes, pregnant women, and vegetarians. potassium - Slight hemolysis, interpret results with  - caution.    GFR,  Estimated  Date Value Ref Range Status  03/23/2022 >60 >60 mL/min Final    Comment:    (NOTE) Calculated using the CKD-EPI Creatinine Equation (2021)    eGFR  Date Value Ref Range Status  01/18/2023 76 >59 mL/min/1.73 Final         Failed - Last BP in normal range    BP Readings from Last 1 Encounters:  07/08/23 (!) 147/74         Passed - Last Heart Rate in normal range    Pulse Readings from Last 1 Encounters:  07/08/23 78         Passed - Valid encounter within last 6 months    Recent Outpatient Visits           2 weeks ago Type 2 diabetes mellitus with other specified complication, without long-term current use of insulin (HCC)   [facility], [place], PA-C       Future Appointments             In 3 weeks Ostwalt, Edmon Crape, PA-C [facility]

## 2023-08-06 ENCOUNTER — Other Ambulatory Visit: Payer: Self-pay | Admitting: Physician Assistant

## 2023-08-06 DIAGNOSIS — E119 Type 2 diabetes mellitus without complications: Secondary | ICD-10-CM

## 2023-08-08 NOTE — Telephone Encounter (Signed)
 Requested Prescriptions  Pending Prescriptions Disp Refills   metFORMIN (GLUCOPHAGE) 500 MG tablet [Pharmacy Med Name: METFORMIN 500MG  TABLETS] 90 tablet 0    Sig: TAKE 1 TABLET(500 MG) BY MOUTH DAILY WITH BREAKFAST     Endocrinology:  Diabetes - Biguanides Failed - 08/08/2023  3:14 PM      Failed - HBA1C is between 0 and 7.9 and within 180 days    Hgb A1c MFr Bld  Date Value Ref Range Status  01/18/2023 7.2 (H) 4.8 - 5.6 % Final    Comment:             Prediabetes: 5.7 - 6.4          Diabetes: >6.4          Glycemic control for adults with diabetes: <7.0          Failed - B12 Level in normal range and within 720 days    Vitamin B-12  Date Value Ref Range Status  10/31/2019 647 232 - 1,245 pg/mL Final         Passed - Cr in normal range and within 360 days    Creatinine  Date Value Ref Range Status  06/21/2013 0.54 (L) 0.60 - 1.30 mg/dL Final   Creatinine, Ser  Date Value Ref Range Status  01/18/2023 0.79 0.57 - 1.00 mg/dL Final         Passed - eGFR in normal range and within 360 days    EGFR (African American)  Date Value Ref Range Status  06/21/2013 >60  Final   GFR calc Af Amer  Date Value Ref Range Status  05/05/2020 99 >59 mL/min/1.73 Final    Comment:    **In accordance with recommendations from the NKF-ASN Task force,**   [facility] is in the process of updating its eGFR calculation to the   2021 CKD-EPI creatinine equation that estimates kidney function   without a race variable.    EGFR (Non-African Amer.)  Date Value Ref Range Status  06/21/2013 >60  Final    Comment:    eGFR values <32mL/min/1.73 m2 may be an indication of chronic kidney disease (CKD). Calculated eGFR is useful in patients with stable renal function. The eGFR calculation will not be reliable in acutely ill patients when serum creatinine is changing rapidly. It is not useful in  patients on dialysis. The eGFR calculation may not be applicable to patients at the low and high extremes  of body sizes, pregnant women, and vegetarians. potassium - Slight hemolysis, interpret results with  - caution.    GFR, Estimated  Date Value Ref Range Status  03/23/2022 >60 >60 mL/min Final    Comment:    (NOTE) Calculated using the CKD-EPI Creatinine Equation (2021)    eGFR  Date Value Ref Range Status  01/18/2023 76 >59 mL/min/1.73 Final         Passed - Valid encounter within last 6 months    Recent Outpatient Visits           1 month ago Type 2 diabetes mellitus with other specified complication, without long-term current use of insulin (HCC)   [facility] [place], [place], PA-C       Future Appointments             In 2 weeks Ostwalt, [place], PA-C  [facility]            Passed - CBC within normal limits and completed in the last 12 months  WBC  Date Value Ref Range Status  01/18/2023 5.7 3.4 - 10.8 x10E3/uL Final  06/21/2013 4.9 3.6 - 11.0 x10 3/mm 3 Final  09/21/2011 4.6 4.0 - 10.5 K/uL Final   RBC  Date Value Ref Range Status  01/18/2023 4.64 3.77 - 5.28 x10E6/uL Final  06/21/2013 4.28 3.80 - 5.20 X10 6/mm 3 Final  09/21/2011 4.55 3.87 - 5.11 MIL/uL Final   Hemoglobin  Date Value Ref Range Status  01/18/2023 14.7 11.1 - 15.9 g/dL Final   Hematocrit  Date Value Ref Range Status  01/18/2023 45.8 34.0 - 46.6 % Final   MCHC  Date Value Ref Range Status  01/18/2023 32.1 31.5 - 35.7 g/dL Final  16/01/9603 54.0 32.0 - 36.0 g/dL Final  98/02/9146 82.9 30.0 - 36.0 g/dL Final   [facility]  Date Value Ref Range Status  01/18/2023 31.7 26.6 - 33.0 pg Final  06/21/2013 32.9 26.0 - 34.0 pg Final  09/21/2011 32.1 26.0 - 34.0 pg Final   MCV  Date Value Ref Range Status  01/18/2023 99 (H) 79 - 97 fL Final  06/21/2013 97 80 - 100 fL Final   No results found for: "PLTCOUNTKUC", "LABPLAT", "POCPLA" RDW  Date Value Ref Range Status  01/18/2023 12.8 11.7 - 15.4 % Final  06/21/2013 13.2 11.5 - 14.5 %  Final

## 2023-08-09 ENCOUNTER — Other Ambulatory Visit: Payer: Self-pay | Admitting: Physician Assistant

## 2023-08-09 DIAGNOSIS — M542 Cervicalgia: Secondary | ICD-10-CM

## 2023-08-22 ENCOUNTER — Ambulatory Visit: Admitting: Physician Assistant

## 2023-08-24 DIAGNOSIS — B353 Tinea pedis: Secondary | ICD-10-CM | POA: Diagnosis not present

## 2023-08-24 DIAGNOSIS — L814 Other melanin hyperpigmentation: Secondary | ICD-10-CM | POA: Diagnosis not present

## 2023-08-24 DIAGNOSIS — L821 Other seborrheic keratosis: Secondary | ICD-10-CM | POA: Diagnosis not present

## 2023-08-24 DIAGNOSIS — D229 Melanocytic nevi, unspecified: Secondary | ICD-10-CM | POA: Diagnosis not present

## 2023-10-24 DIAGNOSIS — H40153 Residual stage of open-angle glaucoma, bilateral: Secondary | ICD-10-CM | POA: Diagnosis not present

## 2023-10-30 ENCOUNTER — Other Ambulatory Visit: Payer: Self-pay | Admitting: Physician Assistant

## 2023-10-30 DIAGNOSIS — I10 Essential (primary) hypertension: Secondary | ICD-10-CM

## 2023-11-01 NOTE — Telephone Encounter (Signed)
 Requested medication (s) are due for refill today: yes  Requested medication (s) are on the active medication list: yes  Last refill:  07/27/23 #90 0 refills  Future visit scheduled: yes on 12/07/23  Notes to clinic:  protocol failed. Last labs 01/18/23 do you want to refill Rx?     Requested Prescriptions  Pending Prescriptions Disp Refills   atenolol -chlorthalidone  (TENORETIC ) 50-25 MG tablet [Pharmacy Med Name: ATENOLOL /CHLORTHALIDONE  50/25 TABS] 90 tablet 0    Sig: Take 1 tablet by mouth daily.     Cardiovascular: Beta Blocker + Diuretic Combos Failed - 11/01/2023  2:40 PM      Failed - K in normal range and within 180 days    Potassium  Date Value Ref Range Status  01/18/2023 3.5 3.5 - 5.2 mmol/L Final  06/21/2013 4.2 3.5 - 5.1 mmol/L Final         Failed - Na in normal range and within 180 days    Sodium  Date Value Ref Range Status  01/18/2023 142 134 - 144 mmol/L Final  06/21/2013 138 136 - 145 mmol/L Final         Failed - Cr in normal range and within 180 days    Creatinine  Date Value Ref Range Status  06/21/2013 0.54 (L) 0.60 - 1.30 mg/dL Final   Creatinine, Ser  Date Value Ref Range Status  01/18/2023 0.79 0.57 - 1.00 mg/dL Final         Failed - eGFR in normal range and within 180 days    EGFR (African American)  Date Value Ref Range Status  06/21/2013 >60  Final   GFR calc Af Amer  Date Value Ref Range Status  05/05/2020 99 >59 mL/min/1.73 Final    Comment:    **In accordance with recommendations from the NKF-ASN Task force,**   [facility] is in the process of updating its eGFR calculation to the   2021 CKD-EPI creatinine equation that estimates kidney function   without a race variable.    EGFR (Non-African Amer.)  Date Value Ref Range Status  06/21/2013 >60  Final    Comment:    eGFR values <51mL/min/1.73 m2 may be an indication of chronic kidney disease (CKD). Calculated eGFR is useful in patients with stable renal function. The eGFR  calculation will not be reliable in acutely ill patients when serum creatinine is changing rapidly. It is not useful in  patients on dialysis. The eGFR calculation may not be applicable to patients at the low and high extremes of body sizes, pregnant women, and vegetarians. potassium - Slight hemolysis, interpret results with  - caution.    GFR, Estimated  Date Value Ref Range Status  03/23/2022 >60 >60 mL/min Final    Comment:    (NOTE) Calculated using the CKD-EPI Creatinine Equation (2021)    eGFR  Date Value Ref Range Status  01/18/2023 76 >59 mL/min/1.73 Final         Failed - Last BP in normal range    BP Readings from Last 1 Encounters:  07/08/23 (!) 147/74         Passed - Last Heart Rate in normal range    Pulse Readings from Last 1 Encounters:  07/08/23 78         Passed - Valid encounter within last 6 months    Recent Outpatient Visits           3 months ago Type 2 diabetes mellitus with other specified complication, without long-term current use  of insulin [facility])   [facility] [place], Janna, PA-C

## 2023-11-04 ENCOUNTER — Telehealth: Payer: Self-pay | Admitting: Physician Assistant

## 2023-11-04 DIAGNOSIS — E119 Type 2 diabetes mellitus without complications: Secondary | ICD-10-CM

## 2023-11-04 NOTE — Telephone Encounter (Signed)
 [facility] pharmacy faxed refill request for the following medications:   atenolol -chlorthalidone  (TENORETIC ) 50-25 MG tablet   metFORMIN  (GLUCOPHAGE ) 500 MG tablet    Please advise

## 2023-11-08 ENCOUNTER — Other Ambulatory Visit: Payer: Self-pay | Admitting: Family Medicine

## 2023-11-08 ENCOUNTER — Other Ambulatory Visit: Payer: Self-pay

## 2023-11-08 DIAGNOSIS — E119 Type 2 diabetes mellitus without complications: Secondary | ICD-10-CM

## 2023-11-08 DIAGNOSIS — I10 Essential (primary) hypertension: Secondary | ICD-10-CM

## 2023-11-08 MED ORDER — METFORMIN HCL 500 MG PO TABS: 500.0000 mg | ORAL_TABLET | Freq: Every day | ORAL | 0 refills | Status: DC

## 2023-11-08 MED ORDER — METFORMIN HCL 500 MG PO TABS: 500.0000 mg | ORAL_TABLET | Freq: Every day | ORAL | 1 refills | Status: AC

## 2023-11-08 MED ORDER — ATENOLOL-CHLORTHALIDONE 50-25 MG PO TABS: 1.0000 | ORAL_TABLET | Freq: Every day | ORAL | 1 refills | Status: AC

## 2023-11-08 NOTE — Telephone Encounter (Signed)
 Medication has been sent to [facility] pharmacy.

## 2023-12-03 NOTE — Progress Notes (Signed)
 Established patient visit  Patient: Nicole Lynn   DOB: 10-24-1940   83 y.o. Female  MRN: [id number] Visit Date: 12/07/2023  Today's healthcare provider: Jolynn Spencer, PA-C   Chief Complaint  Patient presents with   Medical Management of Chronic Issues    Patient last seen 07/08/23 with labs completed on 01/18/23. She reports no symptoms and only taking her metformin  and atenolol .    Subjective     HPI     Medical Management of Chronic Issues    Additional comments: Patient last seen 07/08/23 with labs completed on 01/18/23. She reports no symptoms and only taking her metformin  and atenolol .       Last edited by Lilian Fitzpatrick, CMA on 12/07/2023  3:46 PM.       Discussed the use of AI scribe software for clinical note transcription with the patient, who gave verbal consent to proceed.  History of Present Illness Nicole Lynn is an 83 year old female with type 2 diabetes and hypertension who presents for a follow-up visit.  Her type 2 diabetes is managed with metformin  500 mcg, a low-carb diet, and regular exercise. Her last A1c was 7.2, and her GFR is 76. Hypertension is managed with Tenoretic  50/25 mg, lisinopril  10 mg, diet, and lifestyle modifications. Her blood pressure remains unstable. Hyperlipidemia is noted with an LDL of 81; she has discontinued rosuvastatin  and is taking aspirin. Subclinical hypothyroidism is asymptomatic, with a need to monitor TSH levels.  She experiences knee and neck pain, undergoing acupuncture and physical therapy. Dermatological issues include angioma, slanty nevus, seborrheic keratosis, and seborrheic dermatitis. Ketoconazole is prescribed for tinea pedis.  She notices ear wax buildup, causing a feeling of blockage. She has presbyopia and cataracts not ready for surgery. She denies depression and is not on blood thinners. She engages in daily walks and has lost weight recently. She experiences post-nasal drainage and congestion  without pain.       03/03/2023    4:01 PM 10/20/2022    3:53 PM 09/22/2022    1:24 PM  Depression screen PHQ 2/9  Decreased Interest 0 0 3  Down, Depressed, Hopeless 0 0 3  PHQ - 2 Score 0 0 6  Altered sleeping   3  Tired, decreased energy   3  Change in appetite   0  Feeling bad or failure about yourself    0  Trouble concentrating   0  Moving slowly or fidgety/restless   0  Suicidal thoughts   0  PHQ-9 Score   12      09/22/2022    1:28 PM  GAD 7 : Generalized Anxiety Score  Nervous, Anxious, on Edge 0  Control/stop worrying 3  Worry too much - different things 3  Trouble relaxing 0  Restless 0  Easily annoyed or irritable 0  Afraid - awful might happen 3  Total GAD 7 Score 9    Medications: Outpatient Medications Prior to Visit  Medication Sig   atenolol -chlorthalidone  (TENORETIC ) 50-25 MG tablet Take 1 tablet by mouth daily.   metFORMIN  (GLUCOPHAGE ) 500 MG tablet Take 1 tablet (500 mg total) by mouth daily with breakfast.   Blood Glucose Monitoring Suppl (ONETOUCH VERIO FLEX SYSTEM) w/Device KIT 1 each by Does not apply route daily at 2 PM. (Patient not taking: Reported on 12/07/2023)   buPROPion  ER (WELLBUTRIN  SR) 100 MG 12 hr tablet TAKE 1 TABLET(100 MG) BY MOUTH TWICE DAILY (Patient not taking: Reported on 12/07/2023)   cyclobenzaprine  (FLEXERIL ) 5 MG  tablet TAKE 1 TABLET(5 MG) BY MOUTH THREE TIMES DAILY AS NEEDED FOR MUSCLE SPASMS (Patient not taking: Reported on 12/07/2023)   glucose blood test strip Use as instructed (Patient not taking: Reported on 12/07/2023)   lisinopril  (ZESTRIL ) 5 MG tablet TAKE 1 TABLET(5 MG) BY MOUTH DAILY (Patient not taking: Reported on 12/07/2023)   Multiple Vitamins-Minerals (CENTRUM SILVER PO) Take by mouth once. (Patient not taking: Reported on 12/07/2023)   OneTouch Delica Lancets 30G MISC Use as directed (Patient not taking: Reported on 12/07/2023)   rosuvastatin  (CRESTOR ) 10 MG tablet TAKE 1 TABLET(10 MG) BY MOUTH DAILY (Patient not  taking: Reported on 12/07/2023)   Semaglutide ,0.25 or 0.5MG /DOS, (OZEMPIC , 0.25 OR 0.5 MG/DOSE,) 2 MG/3ML SOPN Inject 0.5 mg into the skin once a week. (Patient not taking: Reported on 12/07/2023)   No facility-administered medications prior to visit.    Review of Systems  All other systems reviewed and are negative.  All negative Except see HPI       Objective    BP 134/67 (BP Location: Left Arm, Patient Position: Sitting, Cuff Size: Normal)   Pulse 69   Ht 5' 1 (1.549 m)   Wt 176 lb 6.4 oz (80 kg)   SpO2 96%   BMI 33.33 kg/m     Physical Exam Vitals reviewed.  Constitutional:      General: She is not in acute distress.    Appearance: Normal appearance. She is well-developed. She is not diaphoretic.  HENT:     Head: Normocephalic and atraumatic.  Eyes:     General: No scleral icterus.    Conjunctiva/sclera: Conjunctivae normal.  Neck:     Thyroid : No thyromegaly.  Cardiovascular:     Rate and Rhythm: Normal rate and regular rhythm.     Pulses: Normal pulses.     Heart sounds: Normal heart sounds. No murmur heard. Pulmonary:     Effort: Pulmonary effort is normal. No respiratory distress.     Breath sounds: Normal breath sounds. No wheezing, rhonchi or rales.  Musculoskeletal:     Cervical back: Neck supple.     Right lower leg: No edema.     Left lower leg: No edema.  Lymphadenopathy:     Cervical: No cervical adenopathy.  Skin:    General: Skin is warm and dry.     Findings: No rash.  Neurological:     Mental Status: She is alert and oriented to person, place, and time. Mental status is at baseline.  Psychiatric:        Mood and Affect: Mood normal.        Behavior: Behavior normal.      No results found for any visits on 12/07/23.      Assessment & Plan Type 2 diabetes mellitus Chronic Managed with metformin , low-carb diet, and exercise. Last A1c 7.2, indicating good control. - Continue metformin  500 mcg. - Continue low-carb diet and regular  exercise. Will follow-up  Hypertension Chronic  hypertension managed with Tenoretic  and lisinopril . Advised diet and lifestyle modifications. - Continue Tenoretic  50/25. - Continue diet and lifestyle modifications. Will follow-up  Subclinical hypothyroidism Chronic asymptomatic subclinical hypothyroidism. - Order TSH test. Needs to update other labs Will follow-up  Hyperlipidemia Chronic Previously managed with rosuvastatin , discontinued due to personal preference. Not on cholesterol medication. - Recheck lipid panel Will follow-up  Cerumen impaction, right ear Cerumen impaction causing blockage sensation and cleaning difficulty. - Provide wax removal ear drops. - Instruct to keep ear drops in for ten minutes  to soften cerumen.   Obesity, Class I, BMI 30-34.9 Chronic and stable Body mass index is 33.33 kg/m. Pt does not plan to lose any weight Encouraged healthy diet and regular exercise Will follow-up  No orders of the defined types were placed in this encounter.   Return in about 10 weeks (around 02/15/2024) for aw  in 10 weeks.   The patient was advised to call back or seek an in-person evaluation if the symptoms worsen or if the condition fails to improve as anticipated.  I discussed the assessment and treatment plan with the patient. The patient was provided an opportunity to ask questions and all were answered. The patient agreed with the plan and demonstrated an understanding of the instructions.  I, Janelle Culton, PA-C have reviewed all documentation for this visit. The documentation on 12/07/2023  for the exam, diagnosis, procedures, and orders are all accurate and complete.  Jolynn Spencer, [facility], MMS [facility] [phone] (phone) [phone] (fax)  [facility]

## 2023-12-07 ENCOUNTER — Ambulatory Visit: Admitting: Physician Assistant

## 2023-12-07 ENCOUNTER — Encounter: Payer: Self-pay | Admitting: Physician Assistant

## 2023-12-07 VITALS — BP 134/67 | HR 69 | Ht 61.0 in | Wt 176.4 lb

## 2023-12-07 DIAGNOSIS — I152 Hypertension secondary to endocrine disorders: Secondary | ICD-10-CM

## 2023-12-07 DIAGNOSIS — E119 Type 2 diabetes mellitus without complications: Secondary | ICD-10-CM

## 2023-12-07 DIAGNOSIS — E038 Other specified hypothyroidism: Secondary | ICD-10-CM

## 2023-12-07 DIAGNOSIS — R5383 Other fatigue: Secondary | ICD-10-CM

## 2023-12-07 DIAGNOSIS — M542 Cervicalgia: Secondary | ICD-10-CM

## 2023-12-07 DIAGNOSIS — Z7984 Long term (current) use of oral hypoglycemic drugs: Secondary | ICD-10-CM

## 2023-12-07 DIAGNOSIS — K219 Gastro-esophageal reflux disease without esophagitis: Secondary | ICD-10-CM

## 2023-12-07 DIAGNOSIS — E66811 Obesity, class 1: Secondary | ICD-10-CM

## 2023-12-07 DIAGNOSIS — E785 Hyperlipidemia, unspecified: Secondary | ICD-10-CM

## 2023-12-07 DIAGNOSIS — E1169 Type 2 diabetes mellitus with other specified complication: Secondary | ICD-10-CM

## 2023-12-07 DIAGNOSIS — E1159 Type 2 diabetes mellitus with other circulatory complications: Secondary | ICD-10-CM

## 2023-12-07 DIAGNOSIS — R202 Paresthesia of skin: Secondary | ICD-10-CM

## 2023-12-07 DIAGNOSIS — Z7985 Long-term (current) use of injectable non-insulin antidiabetic drugs: Secondary | ICD-10-CM

## 2024-01-09 DIAGNOSIS — E785 Hyperlipidemia, unspecified: Secondary | ICD-10-CM | POA: Diagnosis not present

## 2024-01-09 DIAGNOSIS — E1169 Type 2 diabetes mellitus with other specified complication: Secondary | ICD-10-CM | POA: Diagnosis not present

## 2024-01-09 DIAGNOSIS — E1159 Type 2 diabetes mellitus with other circulatory complications: Secondary | ICD-10-CM | POA: Diagnosis not present

## 2024-01-09 DIAGNOSIS — I152 Hypertension secondary to endocrine disorders: Secondary | ICD-10-CM | POA: Diagnosis not present

## 2024-01-09 DIAGNOSIS — E038 Other specified hypothyroidism: Secondary | ICD-10-CM | POA: Diagnosis not present

## 2024-01-10 ENCOUNTER — Ambulatory Visit: Payer: Self-pay | Admitting: Physician Assistant

## 2024-01-10 LAB — COMPREHENSIVE METABOLIC PANEL WITH GFR
ALT: 15 IU/L (ref 0–32)
AST: 16 IU/L (ref 0–40)
Albumin: 4.2 g/dL (ref 3.7–4.7)
Alkaline Phosphatase: 57 IU/L (ref 48–129)
BUN/Creatinine Ratio: 25 (ref 12–28)
BUN: 21 mg/dL (ref 8–27)
Bilirubin Total: 0.4 mg/dL (ref 0.0–1.2)
CO2: 22 mmol/L (ref 20–29)
Calcium: 9.8 mg/dL (ref 8.7–10.3)
Chloride: 99 mmol/L (ref 96–106)
Creatinine, Ser: 0.83 mg/dL (ref 0.57–1.00)
Globulin, Total: 2.2 g/dL (ref 1.5–4.5)
Glucose: 135 mg/dL — ABNORMAL HIGH (ref 70–99)
Potassium: 4.1 mmol/L (ref 3.5–5.2)
Sodium: 141 mmol/L (ref 134–144)
Total Protein: 6.4 g/dL (ref 6.0–8.5)
eGFR: 71 mL/min/1.73 (ref >59)

## 2024-01-10 LAB — HEMOGLOBIN A1C
Est. average glucose Bld gHb Est-mCnc: 154 mg/dL
Hgb A1c MFr Bld: 7 % — ABNORMAL HIGH (ref 4.8–5.6)

## 2024-01-10 LAB — LIPID PANEL
Chol/HDL Ratio: 4.9 ratio — ABNORMAL HIGH (ref 0.0–4.4)
Cholesterol, Total: 262 mg/dL — ABNORMAL HIGH (ref 100–199)
HDL: 53 mg/dL (ref >39)
LDL Chol Calc (NIH): 171 mg/dL — ABNORMAL HIGH (ref 0–99)
Triglycerides: 203 mg/dL — ABNORMAL HIGH (ref 0–149)
VLDL Cholesterol Cal: 38 mg/dL (ref 5–40)

## 2024-01-10 LAB — TSH: TSH: 6.34 u[IU]/mL — ABNORMAL HIGH (ref 0.450–4.500)

## 2024-01-10 NOTE — Progress Notes (Signed)
 All labs are stable except Elevated cholesterol levels.  Advised low cholesterol diet and staying active. We will discuss the results at the follow-up Elevated A1c of 7. Continue current regimen, low carb diet Elevated TSH. Will follow-up at the next appointment

## 2024-01-11 DIAGNOSIS — M65341 Trigger finger, right ring finger: Secondary | ICD-10-CM | POA: Diagnosis not present

## 2024-01-11 DIAGNOSIS — M65331 Trigger finger, right middle finger: Secondary | ICD-10-CM | POA: Diagnosis not present

## 2024-01-25 DIAGNOSIS — Z1211 Encounter for screening for malignant neoplasm of colon: Secondary | ICD-10-CM | POA: Diagnosis not present

## 2024-01-31 DIAGNOSIS — M542 Cervicalgia: Secondary | ICD-10-CM | POA: Diagnosis not present

## 2024-01-31 DIAGNOSIS — M25511 Pain in right shoulder: Secondary | ICD-10-CM | POA: Diagnosis not present

## 2024-01-31 DIAGNOSIS — G8929 Other chronic pain: Secondary | ICD-10-CM | POA: Diagnosis not present

## 2024-01-31 DIAGNOSIS — M5412 Radiculopathy, cervical region: Secondary | ICD-10-CM | POA: Diagnosis not present

## 2024-02-01 LAB — COLOGUARD: COLOGUARD: NEGATIVE

## 2024-02-09 NOTE — Progress Notes (Signed)
 Nicole Lynn                                          MRN: [id number]   02/09/2024   The [facility] Quality Team Specialist reviewed this patient medical record for the purposes of chart review for care gap closure. The following were reviewed: encounter created in error.    [facility]BCI Quality Team

## 2024-02-09 NOTE — Progress Notes (Signed)
 Adelyn Roscher                                          MRN: [id number]   02/09/2024   The [facility]BC[facility] Quality Team Specialist reviewed this patient medical record for the purposes of chart review for care gap closure. The following were reviewed: chart review for care gap closure-kidney health evaluation for diabetes:eGFR  and uACR.    [facility]BCI Quality Team

## 2024-02-13 ENCOUNTER — Ambulatory Visit: Admitting: Cardiovascular Disease

## 2024-02-13 DIAGNOSIS — K080 Exfoliation of teeth due to systemic causes: Secondary | ICD-10-CM | POA: Diagnosis not present

## 2024-02-17 ENCOUNTER — Ambulatory Visit: Admitting: Physician Assistant

## 2024-02-17 VITALS — BP 124/64 | HR 66 | Resp 14 | Ht 61.0 in | Wt 175.1 lb

## 2024-02-17 DIAGNOSIS — Z8659 Personal history of other mental and behavioral disorders: Secondary | ICD-10-CM

## 2024-02-17 DIAGNOSIS — I152 Hypertension secondary to endocrine disorders: Secondary | ICD-10-CM

## 2024-02-17 DIAGNOSIS — F418 Other specified anxiety disorders: Secondary | ICD-10-CM

## 2024-02-17 DIAGNOSIS — E1169 Type 2 diabetes mellitus with other specified complication: Secondary | ICD-10-CM

## 2024-02-17 DIAGNOSIS — E1159 Type 2 diabetes mellitus with other circulatory complications: Secondary | ICD-10-CM

## 2024-02-17 DIAGNOSIS — Z7984 Long term (current) use of oral hypoglycemic drugs: Secondary | ICD-10-CM

## 2024-02-17 DIAGNOSIS — M25511 Pain in right shoulder: Secondary | ICD-10-CM

## 2024-02-17 DIAGNOSIS — G8929 Other chronic pain: Secondary | ICD-10-CM

## 2024-02-17 DIAGNOSIS — G4709 Other insomnia: Secondary | ICD-10-CM

## 2024-02-17 DIAGNOSIS — Z0000 Encounter for general adult medical examination without abnormal findings: Secondary | ICD-10-CM

## 2024-02-17 MED ORDER — ZOLPIDEM TARTRATE 5 MG PO TABS: 5.0000 mg | ORAL_TABLET | Freq: Every evening | ORAL | 0 refills | Status: AC | PRN

## 2024-02-17 NOTE — Progress Notes (Signed)
 Annual Wellness Visit     Patient: Nicole Lynn, Female    DOB: 09-23-1940, 83 y.o.   MRN: [id number] Visit Date: 02/17/2024  Today's Provider: Jolynn Spencer, PA-C   Chief Complaint  Patient presents with   Medicare Wellness   Subjective    Nicole Lynn is a 83 y.o. female who presents today for her Annual Wellness Visit. Discussed the use of AI scribe software for clinical note transcription with the patient, who gave verbal consent to proceed.  History of Present Illness Nicole Lynn is an 83 year old female who presents with a swollen cheek following a recent root canal.  She has a swollen cheek following a recent root canal procedure. She regularly visits the dentist twice a year and has undergone multiple crowns and root canals.  She has diabetes and takes medication but has not been checking her blood sugar regularly. She visits the eye doctor twice a year with no issues with retinopathy and stable vision.  She experiences low energy levels and decreased activity. She walks regularly for 45 minutes to an hour at a slower pace than her companion.  She has experienced significant weight loss from nearly 200 pounds to 173-175 pounds and is concerned about further weight loss due to loose skin.  She has arthritis with pain partially relieved by acupuncture and chiropractic treatments. She plans to have an MRI in [facility], preferring an open MRI due to claustrophobia.  She has no issues with bowel movements, chest pain, or breast problems. She has some memory concerns, particularly with names, but feels confident in her ability to navigate familiar routes.      Medications: Outpatient Medications Prior to Visit  Medication Sig   atenolol -chlorthalidone  (TENORETIC ) 50-25 MG tablet Take 1 tablet by mouth daily.   Blood Glucose Monitoring Suppl (ONETOUCH VERIO FLEX SYSTEM) w/Device KIT 1 each by Does not apply route daily at 2 PM. (Patient not  taking: Reported on 12/07/2023)   buPROPion  ER (WELLBUTRIN  SR) 100 MG 12 hr tablet TAKE 1 TABLET(100 MG) BY MOUTH TWICE DAILY (Patient not taking: Reported on 12/07/2023)   cyclobenzaprine  (FLEXERIL ) 5 MG tablet TAKE 1 TABLET(5 MG) BY MOUTH THREE TIMES DAILY AS NEEDED FOR MUSCLE SPASMS (Patient not taking: Reported on 12/07/2023)   glucose blood test strip Use as instructed (Patient not taking: Reported on 12/07/2023)   lisinopril  (ZESTRIL ) 5 MG tablet TAKE 1 TABLET(5 MG) BY MOUTH DAILY (Patient not taking: Reported on 12/07/2023)   metFORMIN  (GLUCOPHAGE ) 500 MG tablet Take 1 tablet (500 mg total) by mouth daily with breakfast.   Multiple Vitamins-Minerals (CENTRUM SILVER PO) Take by mouth once. (Patient not taking: Reported on 12/07/2023)   OneTouch Delica Lancets 30G MISC Use as directed (Patient not taking: Reported on 12/07/2023)   rosuvastatin  (CRESTOR ) 10 MG tablet TAKE 1 TABLET(10 MG) BY MOUTH DAILY (Patient not taking: Reported on 12/07/2023)   Semaglutide ,0.25 or 0.5MG /DOS, (OZEMPIC , 0.25 OR 0.5 MG/DOSE,) 2 MG/3ML SOPN Inject 0.5 mg into the skin once a week. (Patient not taking: Reported on 12/07/2023)   No facility-administered medications prior to visit.    Allergies  Allergen Reactions   Latex Other (See Comments)    Patient Care Team: Franklin Clapsaddle, PA-C as PCP - General (Physician Assistant) Darcel Pool, MD as Consulting Physician (Obstetrics and Gynecology) Hilma Domino, MD as Referring Physician (Dermatology)  Review of Systems       Objective    Vitals: BP 124/64   Pulse 66  Resp 14   Ht 5' 1 (1.549 m)   Wt 175 lb 1.6 oz (79.4 kg)   SpO2 96%   BMI 33.08 kg/m      Physical Exam Vitals reviewed.  Constitutional:      General: She is not in acute distress.    Appearance: Normal appearance. She is well-developed. She is obese. She is not ill-appearing, toxic-appearing or diaphoretic.  HENT:     Head: Normocephalic and atraumatic.     Right Ear: Tympanic  membrane, ear canal and external ear normal.     Left Ear: Tympanic membrane, ear canal and external ear normal.     Nose: Nose normal. No congestion or rhinorrhea.     Mouth/Throat:     Mouth: Mucous membranes are moist.     Pharynx: Oropharynx is clear. No oropharyngeal exudate.  Eyes:     General: No scleral icterus.       Right eye: No discharge.        Left eye: No discharge.     Conjunctiva/sclera: Conjunctivae normal.     Pupils: Pupils are equal, round, and reactive to light.  Neck:     Thyroid : No thyromegaly.     Vascular: No carotid bruit.  Cardiovascular:     Rate and Rhythm: Normal rate and regular rhythm.     Pulses: Normal pulses.     Heart sounds: Normal heart sounds. No murmur heard.    No friction rub. No gallop.  Pulmonary:     Effort: Pulmonary effort is normal. No respiratory distress.     Breath sounds: Normal breath sounds. No wheezing or rales.  Abdominal:     General: Abdomen is flat. Bowel sounds are normal. There is no distension.     Palpations: Abdomen is soft. There is no mass.     Tenderness: There is no abdominal tenderness. There is no right CVA tenderness, left CVA tenderness, guarding or rebound.     Hernia: No hernia is present.  Musculoskeletal:        General: No swelling, tenderness, deformity or signs of injury. Normal range of motion.     Cervical back: Normal range of motion and neck supple. No rigidity or tenderness.     Right lower leg: No edema.     Left lower leg: No edema.  Lymphadenopathy:     Cervical: No cervical adenopathy.  Skin:    General: Skin is warm and dry.     Coloration: Skin is not jaundiced or pale.     Findings: No bruising, erythema, lesion or rash.  Neurological:     Mental Status: She is alert and oriented to person, place, and time. Mental status is at baseline.     Gait: Gait normal.  Psychiatric:        Mood and Affect: Mood normal.        Behavior: Behavior normal.        Thought Content: Thought  content normal.        Judgment: Judgment normal.     Most recent functional status assessment:    02/17/2024    3:45 PM  In your present state of health, do you have any difficulty performing the following activities:  Hearing? 0  Vision? 0  Difficulty concentrating or making decisions? 0  Walking or climbing stairs? 0  Dressing or bathing? 0  Doing errands, shopping? 0  Preparing Food and eating ? N  Using the Toilet? N  In the past six months, have  you accidently leaked urine? N  Do you have problems with loss of bowel control? N  Managing your Medications? N  Managing your Finances? N  Housekeeping or managing your Housekeeping? N   Most recent fall risk assessment:    02/17/2024    3:52 PM  Fall Risk   Falls in the past year? 0  Number falls in past yr: 0  Injury with Fall? 0  Risk for fall due to : No Fall Risks  Follow up Falls evaluation completed    Most recent depression screenings:    03/03/2023    4:01 PM 10/20/2022    3:53 PM  PHQ 2/9 Scores  PHQ - 2 Score 0 0   Most recent cognitive screening:    02/17/2024    3:48 PM  6CIT Screen  What Year? 0 points  What month? 3 points  What time? 0 points  Count back from 20 0 points  Months in reverse 0 points  Repeat phrase 8 points  Total Score 11 points   Most recent Audit-C alcohol use screening    10/20/2022    3:52 PM  Alcohol Use Disorder Test (AUDIT)  1. How often do you have a drink containing alcohol? 0  2. How many drinks containing alcohol do you have on a typical day when you are drinking? 0  3. How often do you have six or more drinks on one occasion? 0  AUDIT-C Score 0   A score of 3 or more in women, and 4 or more in men indicates increased risk for alcohol abuse, EXCEPT if all of the points are from question 1   No results found for any visits on 02/17/24.  Assessment & Plan     Annual wellness visit done today including the all of the following: Reviewed patient's Family  Medical History Reviewed and updated list of patient's medical providers Assessment of cognitive impairment was done Assessed patient's functional ability Established a written schedule for health screening services Health Risk Assessent Completed and Reviewed  Exercise Activities and Dietary recommendations  Goals      Weight (lb) < 180 lb (81.6 kg)     Starting in 2018, I will work to lose 10 lbs this year.         Immunization History  Administered Date(s) Administered   Fluad Quad(high Dose 65+) 03/06/2019, 04/11/2020   Fluad Trivalent(High Dose 65+) 01/26/2023, 03/03/2023   INFLUENZA, HIGH DOSE SEASONAL PF 02/18/2014, 01/05/2016, 02/08/2018   Influenza Split 06/13/2012   Influenza,inj,Quad PF,6+ Mos 01/08/2013, 05/16/2015   Pneumococcal Conjugate-13 05/16/2015   Pneumococcal Polysaccharide-23 01/08/2013   Zoster, Live 10/25/2012    Health Maintenance  Topic Date Due   COVID-19 Vaccine (1) Never done   Zoster Vaccines- Shingrix (1 of 2) 10/04/1961   FOOT EXAM  09/15/2022   Diabetic kidney evaluation - Urine ACR  10/30/2022   Medicare Annual Wellness (AWV)  10/20/2023   Influenza Vaccine  11/25/2023   OPHTHALMOLOGY EXAM  02/10/2024   HEMOGLOBIN A1C  07/08/2024   Diabetic kidney evaluation - eGFR measurement  01/08/2025   Pneumococcal Vaccine: 50+ Years  Completed   DEXA SCAN  Completed   Meningococcal B Vaccine  Aged Out   DTaP/Tdap/Td  Discontinued   Colonoscopy  Discontinued     Discussed health benefits of physical activity, and encouraged her to engage in regular exercise appropriate for her age and condition.    Assessment & Plan Adult Wellness Visit She maintained weight at 173-175 pounds  and expressed concern about sagging skin due to weight loss. She attended regular dental and eye check-ups. - Encouraged continuation of regular walking for 45-60 minutes. - Advised on toning exercises for sagging skin. - Reassured about current weight maintenance and  encouraged monitoring. - Continue regular dental and eye check-ups twice a year. Labwork was reviewed from 01/09/24 with elevated cholesterol levels, A1c of 7 and elevated TSH Will follow-up  Type 2 Diabetes Mellitus Chronic and previously stable She managed diabetes with medication and dietary modifications but did not regularly check blood sugar. - Encouraged regular monitoring of blood sugar levels. - Continue diabetic medication as prescribed. - Advised on maintaining a healthy diet and adequate hydration.  Essential Hypertension Chronic and stable She managed hypertension with medication. - Continue blood pressure medication as prescribed. Continue lifestyle modifications We will follow-up  Chronic Right Shoulder Pain  Chronic right shoulder pain with suspected osteoarthritis. Acupuncture and chiropractic treatments provided some relief. MRI scheduled but has concerns regarding claustrophobia. Discussed physical therapy and exercise importance. Surgery mentioned as a potential option. - Proceed with scheduled MRI at [facility]. - Consider referral to physical therapy for evaluation and home exercise program. - Encourage regular exercise and physical therapy to manage symptoms. - Suggest regular massage therapy to improve comfort.   Situational anxiety Other insomnia History of claustrophobia Uses medication when travels.  She will be traveling during the holiday season.  Discussed with patient advanced adverse events of zolpidem  and older adults(falls, fractures, delirium increase in ER visit and motor vehicle crashes).  Recommended to use zolpidem  on a as needed basis, advised to take zolpidem  at bedtime We will revisit this medication as a follow-up. Patient expressed understanding and agreed with the plan - Urine Microalbumin w/creat. ratio - zolpidem  (AMBIEN ) 5 MG tablet; Take 1 tablet (5 mg total) by mouth at bedtime as needed for sleep.  Dispense: 30 tablet; Refill:  0  Return in about 3 months (around 05/19/2024) for chronic disease f/u.    The patient was advised to call back or seek an in-person evaluation if the symptoms worsen or if the condition fails to improve as anticipated.  I discussed the assessment and treatment plan with the patient. The patient was provided an opportunity to ask questions and all were answered. The patient agreed with the plan and demonstrated an understanding of the instructions.  I, Iyani Dresner, PA-C have reviewed all documentation for this visit. The documentation on 02/17/2024  for the exam, diagnosis, procedures, and orders are all accurate and complete.    Jolynn Spencer, PA-C  [facility] [phone] (phone) [phone] (fax)  [facility]

## 2024-02-20 DIAGNOSIS — G8929 Other chronic pain: Secondary | ICD-10-CM | POA: Insufficient documentation

## 2024-02-20 DIAGNOSIS — F418 Other specified anxiety disorders: Secondary | ICD-10-CM | POA: Insufficient documentation

## 2024-02-22 DIAGNOSIS — L738 Other specified follicular disorders: Secondary | ICD-10-CM | POA: Diagnosis not present

## 2024-02-22 DIAGNOSIS — D229 Melanocytic nevi, unspecified: Secondary | ICD-10-CM | POA: Diagnosis not present

## 2024-02-22 DIAGNOSIS — L814 Other melanin hyperpigmentation: Secondary | ICD-10-CM | POA: Diagnosis not present

## 2024-02-22 DIAGNOSIS — L821 Other seborrheic keratosis: Secondary | ICD-10-CM | POA: Diagnosis not present

## 2024-02-28 DIAGNOSIS — H524 Presbyopia: Secondary | ICD-10-CM | POA: Diagnosis not present

## 2024-02-28 DIAGNOSIS — H40153 Residual stage of open-angle glaucoma, bilateral: Secondary | ICD-10-CM | POA: Diagnosis not present

## 2024-03-16 NOTE — Progress Notes (Signed)
 Nicole Lynn                                          MRN: [id number]   03/16/2024   The VBCI Quality Team Specialist reviewed this patient medical record for the purposes of chart review for care gap closure. The following were reviewed: chart review for care gap closure-kidney health evaluation for diabetes:eGFR  and uACR.    VBCI Quality Team

## 2024-03-26 NOTE — Progress Notes (Unsigned)
 cardiology Office Note  Date:  03/27/2024   ID:  Nicole Lynn, Nicole Lynn 05/21/1940, MRN [id number]  PCP:  Dineen Channel, PA-C   Chief Complaint  Patient presents with   12 month follow up     Doing well.     HPI:  Ms Nicole Lynn is a 83 year old woman with past medical history of Diabetes Hypertension Hyperlipidemia Coronary calcium  score of 565 in 2023 Nonobstructive CAD by cardiac CTA 2023 Presenting for routine follow-up of her SOB, htn  Last seen in clinic by myself 11/23 In follow-up today reports feeling well, denies significant chest pain or shortness of breath  No regular exercise program Reports she is taking only metformin  and atenolol /chlorthalidone  Does not take lisinopril , only when she is traveling and  needs to sleep Does not want cholesterol medication Previously stopped Crestor , reports it caused some skin blotching and she does not want other medications for cholesterol  Prior imaging reviewed Echocardiogram August 2022, normal study  Cardiac CTA 12/23: no severe stenosis Coronary calcium  score of 565.  Calcified plaque causing moderate proximal LAD stenosis (50%).  Mild proximal RCA stenosis (25%)  Labs reviewed Total chol 262, LDL 171 A1C 7.0  EKG personally reviewed by myself on todays visit EKG Interpretation Date/Time:  Tuesday March 27 2024 16:26:21 EST Ventricular Rate:  72 PR Interval:  144 QRS Duration:  68 QT Interval:  398 QTC Calculation: 435 R Axis:   50  Text Interpretation: Sinus rhythm with sinus arrhythmia with occasional Premature ventricular complexes Possible Left atrial enlargement When compared with ECG of 21-Jun-2013 07:51, Premature ventricular complexes are now Present Confirmed by Perla Lye [phone]) on 03/27/2024 4:32:58 PM    PMH:   has a past medical history of Anxiety, Chest pain, unspecified, Diabetes mellitus without complication (HCC), HTN (hypertension), Menopausal state, and  Overweight(278.02).  PSH:    Past Surgical History:  Procedure Laterality Date   ABDOMINAL HYSTERECTOMY  2002   TAH/BSO   APPENDECTOMY  1959   BILATERAL SALPINGOOPHORECTOMY      Current Outpatient Medications  Medication Sig Dispense Refill   atenolol -chlorthalidone  (TENORETIC ) 50-25 MG tablet Take 1 tablet by mouth daily. 90 tablet 1   ketoconazole (NIZORAL) 2 % cream Apply 1 Application topically daily as needed.     latanoprost (XALATAN) 0.005 % ophthalmic solution Place 1 drop into both eyes at bedtime.     lisinopril  (ZESTRIL ) 5 MG tablet Take 5 mg by mouth daily as needed.     metFORMIN  (GLUCOPHAGE ) 500 MG tablet Take 1 tablet (500 mg total) by mouth daily with breakfast. 90 tablet 1   Multiple Vitamins-Minerals (CENTRUM SILVER PO) Take by mouth once.     Blood Glucose Monitoring Suppl (ONETOUCH VERIO FLEX SYSTEM) w/Device KIT 1 each by Does not apply route daily at 2 PM. (Patient not taking: Reported on 03/27/2024) 1 kit 0   buPROPion  ER (WELLBUTRIN  SR) 100 MG 12 hr tablet TAKE 1 TABLET(100 MG) BY MOUTH TWICE DAILY (Patient not taking: Reported on 03/27/2024) 30 tablet 0   cyclobenzaprine  (FLEXERIL ) 5 MG tablet TAKE 1 TABLET(5 MG) BY MOUTH THREE TIMES DAILY AS NEEDED FOR MUSCLE SPASMS (Patient not taking: Reported on 03/27/2024) 30 tablet 1   glucose blood test strip Use as instructed (Patient not taking: Reported on 03/27/2024) 100 each 12   OneTouch Delica Lancets 30G MISC Use as directed (Patient not taking: Reported on 03/27/2024) 100 each 3   zolpidem  (AMBIEN ) 5 MG tablet Take 1 tablet (5 mg total) by  mouth at bedtime as needed for sleep. (Patient not taking: Reported on 03/27/2024) 30 tablet 0   No current facility-administered medications for this visit.   Allergies:   Latex   Social History:  The patient  reports that she has never smoked. She has never used smokeless tobacco. She reports current alcohol use of about 3.0 standard drinks of alcohol per week. She reports that she  does not use drugs.   Family History:   family history includes Cancer in an other family member; Coronary artery disease in an other family member; Prostate cancer in her father; Stroke in her mother and another family member.    Review of Systems: Review of Systems  Constitutional: Negative.   HENT: Negative.    Respiratory:  Positive for shortness of breath.   Cardiovascular: Negative.   Gastrointestinal: Negative.   Musculoskeletal: Negative.   Neurological: Negative.   Psychiatric/Behavioral: Negative.    All other systems reviewed and are negative.  PHYSICAL EXAM: VS:  BP (!) 140/68 (BP Location: Left Arm, Patient Position: Sitting, Cuff Size: Normal)   Pulse 72   Ht 5' 1 (1.549 m)   Wt 176 lb 8 oz (80.1 kg)   SpO2 94%   BMI 33.35 kg/m  , BMI Body mass index is 33.35 kg/m. Constitutional:  oriented to person, place, and time. No distress.  HENT:  Head: Normocephalic and atraumatic.  Eyes:  no discharge. No scleral icterus.  Neck: Normal range of motion. Neck supple. No JVD present.  Cardiovascular: Normal rate, regular rhythm, normal heart sounds and intact distal pulses. Exam reveals no gallop and no friction rub. No edema No murmur heard. Pulmonary/Chest: Effort normal and breath sounds normal. No stridor. No respiratory distress.  no wheezes.  no rales.  no tenderness.  Abdominal: Soft.  no distension.  no tenderness.  Musculoskeletal: Normal range of motion.  no  tenderness or deformity.  Neurological:  normal muscle tone. Coordination normal. No atrophy Skin: Skin is warm and dry. No rash noted. not diaphoretic.  Psychiatric:  normal mood and affect. behavior is normal. Thought content normal.   Recent Labs: 01/09/2024: ALT 15; BUN 21; Creatinine, Ser 0.83; Potassium 4.1; Sodium 141; TSH 6.340    Lipid Panel Lab Results  Component Value Date   CHOL 262 (H) 01/09/2024   HDL 53 01/09/2024   LDLCALC 171 (H) 01/09/2024   TRIG 203 (H) 01/09/2024    Wt  Readings from Last 3 Encounters:  03/27/24 176 lb 8 oz (80.1 kg)  02/17/24 175 lb 1.6 oz (79.4 kg)  12/07/23 176 lb 6.4 oz (80 kg)    ASSESSMENT AND PLAN:  Problem List Items Addressed This Visit       Cardiology Problems   Hypertension associated with diabetes (HCC) - Primary   Relevant Medications   lisinopril  (ZESTRIL ) 5 MG tablet   Other Relevant Orders   EKG 12-Lead (Completed)   Hyperlipidemia associated with type 2 diabetes mellitus (HCC)   Relevant Medications   lisinopril  (ZESTRIL ) 5 MG tablet     Other   Diabetes mellitus (HCC)   Relevant Medications   lisinopril  (ZESTRIL ) 5 MG tablet    Shortness of breath, chronic stable angina Moderate CAD on CT scan Denies anginal symptoms,  declining cholesterol medication Reports that she is DNR/DNI  Obesity We have encouraged continued exercise, careful diet management in an effort to lose weight.  Essential hypertension On atenolol  chlorthalidone  combo pill, not taking lisinopril  on a regular basis We discussed benefits of  lisinopril , she prefers to take this as needed  Hyperlipidemia Previously took herself off Crestor ,  Does not want medication Cardiac CTA  in 2023, moderate stenosis    Signed, Velinda Lunger, M.D., Ph.D. [facility] [place], [place] [phone]

## 2024-03-27 ENCOUNTER — Encounter: Payer: Self-pay | Admitting: Cardiovascular Disease

## 2024-03-27 ENCOUNTER — Ambulatory Visit: Attending: Cardiovascular Disease | Admitting: Cardiovascular Disease

## 2024-03-27 VITALS — BP 140/68 | HR 72 | Ht 61.0 in | Wt 176.5 lb

## 2024-03-27 DIAGNOSIS — E785 Hyperlipidemia, unspecified: Secondary | ICD-10-CM

## 2024-03-27 DIAGNOSIS — E1159 Type 2 diabetes mellitus with other circulatory complications: Secondary | ICD-10-CM

## 2024-03-27 DIAGNOSIS — E1169 Type 2 diabetes mellitus with other specified complication: Secondary | ICD-10-CM | POA: Diagnosis not present

## 2024-03-27 DIAGNOSIS — I152 Hypertension secondary to endocrine disorders: Secondary | ICD-10-CM

## 2024-03-27 NOTE — Patient Instructions (Signed)

## 2024-03-29 DIAGNOSIS — K080 Exfoliation of teeth due to systemic causes: Secondary | ICD-10-CM | POA: Diagnosis not present

## 2024-06-08 ENCOUNTER — Ambulatory Visit: Admitting: Physician Assistant
